# Patient Record
Sex: Female | Born: 1945 | Race: Black or African American | Hispanic: No | Marital: Married | Smoking: Former smoker
Health system: Southern US, Community
[De-identification: ages and names within clinical notes are randomized; demographics above are authoritative.]

## PROBLEM LIST (undated history)

## (undated) DIAGNOSIS — N189 Chronic kidney disease, unspecified: Secondary | ICD-10-CM

## (undated) DIAGNOSIS — Z972 Presence of dental prosthetic device (complete) (partial): Secondary | ICD-10-CM

## (undated) DIAGNOSIS — F0390 Unspecified dementia without behavioral disturbance: Secondary | ICD-10-CM

## (undated) DIAGNOSIS — I10 Essential (primary) hypertension: Secondary | ICD-10-CM

## (undated) HISTORY — PX: ABDOMINAL HYSTERECTOMY: SHX81

---

## 2010-09-21 ENCOUNTER — Ambulatory Visit: Payer: Self-pay | Admitting: Emergency Medicine

## 2010-09-23 LAB — PATHOLOGY REPORT

## 2010-10-15 ENCOUNTER — Ambulatory Visit: Payer: Self-pay | Admitting: Unknown Physician Specialty

## 2013-04-12 ENCOUNTER — Ambulatory Visit: Payer: Self-pay | Admitting: Family Medicine

## 2018-04-23 DIAGNOSIS — I10 Essential (primary) hypertension: Secondary | ICD-10-CM | POA: Diagnosis present

## 2019-06-12 ENCOUNTER — Other Ambulatory Visit: Payer: Self-pay | Admitting: Family Medicine

## 2019-06-12 DIAGNOSIS — Z1231 Encounter for screening mammogram for malignant neoplasm of breast: Secondary | ICD-10-CM

## 2019-07-29 ENCOUNTER — Ambulatory Visit
Admission: RE | Admit: 2019-07-29 | Discharge: 2019-07-29 | Disposition: A | Discharge status: Home / Self Care | Payer: Medicare Other | Source: Ambulatory Visit | Attending: Family Medicine | Admitting: Family Medicine

## 2019-07-29 ENCOUNTER — Encounter: Payer: Self-pay | Admitting: Radiology

## 2019-07-29 DIAGNOSIS — Z1231 Encounter for screening mammogram for malignant neoplasm of breast: Secondary | ICD-10-CM | POA: Diagnosis not present

## 2019-11-27 ENCOUNTER — Ambulatory Visit: Payer: Medicare Other

## 2019-11-30 ENCOUNTER — Ambulatory Visit: Payer: Self-pay

## 2019-12-14 ENCOUNTER — Ambulatory Visit: Payer: Self-pay

## 2019-12-16 ENCOUNTER — Ambulatory Visit: Payer: Medicare PPO | Attending: Internal Medicine

## 2019-12-16 DIAGNOSIS — Z23 Encounter for immunization: Secondary | ICD-10-CM | POA: Insufficient documentation

## 2019-12-16 NOTE — Progress Notes (Signed)
   Covid-19 Vaccination Clinic  Name:  ANGELICIA LESSNER    MRN: [id number] DOB: 01-Aug-1946  12/16/2019  Ms. Sunderlin was observed post Covid-19 immunization for 15 minutes without incidence. She was provided with Vaccine Information Sheet and instruction to access the V-Safe system.   Ms. Kuznia was instructed to call 911 with any severe reactions post vaccine: Marland Kitchen Difficulty breathing  . Swelling of your face and throat  . A fast heartbeat  . A bad rash all over your body  . Dizziness and weakness    Immunizations Administered    Name Date Dose VIS Date Route   Pfizer COVID-19 Vaccine 12/16/2019 11:46 AM 0.3 mL 10/18/2019 Intramuscular   Manufacturer: [employer], [employer]   Lot: [id number]   NDC: [address]-9

## 2019-12-30 ENCOUNTER — Ambulatory Visit: Payer: Self-pay

## 2020-01-08 ENCOUNTER — Ambulatory Visit: Payer: Medicare PPO

## 2020-01-08 ENCOUNTER — Ambulatory Visit: Payer: Medicare PPO | Attending: Internal Medicine

## 2020-01-08 DIAGNOSIS — Z23 Encounter for immunization: Secondary | ICD-10-CM | POA: Insufficient documentation

## 2020-01-08 NOTE — Progress Notes (Signed)
   Covid-19 Vaccination Clinic  Name:  Yolanda Moore    MRN: [id number] DOB: 1946-01-24  01/08/2020  Yolanda Moore was observed post Covid-19 immunization for 15 minutes without incident. She was provided with Vaccine Information Sheet and instruction to access the V-Safe system.   Yolanda Moore was instructed to call 911 with any severe reactions post vaccine: Marland Kitchen Difficulty breathing  . Swelling of face and throat  . A fast heartbeat  . A bad rash all over body  . Dizziness and weakness   Immunizations Administered    Name Date Dose VIS Date Route   Pfizer COVID-19 Vaccine 01/08/2020  8:20 AM 0.3 mL 10/18/2019 Intramuscular   Manufacturer: [employer], [employer]   Lot: [id number]   NDC: [address]-6    2

## 2020-01-10 ENCOUNTER — Ambulatory Visit: Payer: Medicare PPO

## 2020-04-19 ENCOUNTER — Emergency Department
Admission: EM | Admit: 2020-04-19 | Discharge: 2020-04-19 | Disposition: A | Discharge status: Home / Self Care | Payer: Medicare PPO | Attending: Student | Admitting: Student

## 2020-04-19 ENCOUNTER — Encounter: Payer: Self-pay | Admitting: Emergency Medicine

## 2020-04-19 ENCOUNTER — Emergency Department: Payer: Medicare PPO

## 2020-04-19 DIAGNOSIS — W06XXXA Fall from bed, initial encounter: Secondary | ICD-10-CM | POA: Insufficient documentation

## 2020-04-19 DIAGNOSIS — F0390 Unspecified dementia without behavioral disturbance: Secondary | ICD-10-CM | POA: Insufficient documentation

## 2020-04-19 DIAGNOSIS — Y92013 Bedroom of single-family (private) house as the place of occurrence of the external cause: Secondary | ICD-10-CM | POA: Diagnosis not present

## 2020-04-19 DIAGNOSIS — S92001A Unspecified fracture of right calcaneus, initial encounter for closed fracture: Secondary | ICD-10-CM | POA: Diagnosis not present

## 2020-04-19 DIAGNOSIS — S99911A Unspecified injury of right ankle, initial encounter: Secondary | ICD-10-CM | POA: Diagnosis present

## 2020-04-19 DIAGNOSIS — Y9389 Activity, other specified: Secondary | ICD-10-CM | POA: Insufficient documentation

## 2020-04-19 DIAGNOSIS — F17200 Nicotine dependence, unspecified, uncomplicated: Secondary | ICD-10-CM | POA: Insufficient documentation

## 2020-04-19 DIAGNOSIS — S82891A Other fracture of right lower leg, initial encounter for closed fracture: Secondary | ICD-10-CM

## 2020-04-19 DIAGNOSIS — Y998 Other external cause status: Secondary | ICD-10-CM | POA: Diagnosis not present

## 2020-04-19 DIAGNOSIS — M25571 Pain in right ankle and joints of right foot: Secondary | ICD-10-CM

## 2020-04-19 HISTORY — DX: Other fracture of right lower leg, initial encounter for closed fracture: S82.891A

## 2020-04-19 LAB — CBC WITH DIFFERENTIAL/PLATELET
Abs Immature Granulocytes: 0.06 10*3/uL (ref 0.00–0.07)
Basophils Absolute: 0 10*3/uL (ref 0.0–0.1)
Basophils Relative: 0 %
Eosinophils Absolute: 0.1 10*3/uL (ref 0.0–0.5)
Eosinophils Relative: 1 %
HCT: 36.8 % (ref 36.0–46.0)
Hemoglobin: 12 g/dL (ref 12.0–15.0)
Immature Granulocytes: 1 %
Lymphocytes Relative: 28 %
Lymphs Abs: 2.3 10*3/uL (ref 0.7–4.0)
MCH: 28.6 pg (ref 26.0–34.0)
MCHC: 32.6 g/dL (ref 30.0–36.0)
MCV: 87.8 fL (ref 80.0–100.0)
Monocytes Absolute: 1.1 10*3/uL — ABNORMAL HIGH (ref 0.1–1.0)
Monocytes Relative: 13 %
Neutro Abs: 4.8 10*3/uL (ref 1.7–7.7)
Neutrophils Relative %: 57 %
Platelets: 380 10*3/uL (ref 150–400)
RBC: 4.19 MIL/uL (ref 3.87–5.11)
RDW: 14.5 % (ref 11.5–15.5)
WBC: 8.3 10*3/uL (ref 4.0–10.5)
nRBC: 0 % (ref 0.0–0.2)

## 2020-04-19 LAB — BASIC METABOLIC PANEL
Anion gap: 12 (ref 5–15)
BUN: 17 mg/dL (ref 8–23)
CO2: 23 mmol/L (ref 22–32)
Calcium: 9.7 mg/dL (ref 8.9–10.3)
Chloride: 103 mmol/L (ref 98–111)
Creatinine, Ser: 1.3 mg/dL — ABNORMAL HIGH (ref 0.44–1.00)
GFR calc Af Amer: 47 mL/min — ABNORMAL LOW (ref >60)
GFR calc non Af Amer: 41 mL/min — ABNORMAL LOW (ref >60)
Glucose, Bld: 100 mg/dL — ABNORMAL HIGH (ref 70–99)
Potassium: 3.7 mmol/L (ref 3.5–5.1)
Sodium: 138 mmol/L (ref 135–145)

## 2020-04-19 MED ORDER — FREE SPIRIT KNEE/LEG WALKER MISC: 1.0000 | 0 refills | Status: AC

## 2020-04-19 NOTE — Discharge Instructions (Addendum)
Thank you for letting us take care of you in the emergency department today.   Your x-rays showed that you have a fracture of your heel bone. We have put you in a splint for this. You should follow-up with the orthopedic doctors in the clinic later this week.  Their information is below.  Do not apply any weight through the heel.  We have written you a prescription for special kind of knee walker.  Please take this to a medical supply store and have it filled.  This is a special kind of walker that will help keep your weight off of the leg.  Please follow up with: Orthopedic doctor, information below.   Please return to the ER for any new or worsening symptoms, including severe swelling, increased pain, numbness or tingling, or any other signs/symptoms that are concerning to you.

## 2020-04-19 NOTE — ED Notes (Signed)
E-signature not working at this time. Pt  And family member (sister) verbalized understanding of D/C instructions, prescriptions and follow up care with no further questions at this time. Pt in NAD and wheeled to car at time of D/C.

## 2020-04-19 NOTE — ED Triage Notes (Signed)
Pt to ED with c/o of right leg swelling and pain. Pt states has been ongoing for approx 2 weeks. Swelling as well as redness noted. Pt states difficulty ambulating.

## 2020-04-19 NOTE — ED Notes (Signed)
This tech and Mardelle Matte, EDT placed posterior ankle w/ stirrup and walker provided to pt.

## 2020-04-19 NOTE — ED Provider Notes (Addendum)
[facility] Emergency Department Provider Note  ____________________________________________   First MD Initiated Contact with Patient 04/19/20 1658     (approximate)  I have reviewed the triage vital signs and the nursing notes.  History  Chief Complaint Leg Swelling    HPI Kynsli PAYDEN BONUS is a 74 y.o. female with history of dementia who presents to the emergency department for right ankle pain and swelling.  Patient was attempting to get down from the top of a bunk when she evidently fell.  This was not witnessed, family was in the next room but came immediately and when they heard her fall.  They think she landed on her feet and/or twisted her ankle.  Patient has a history of dementia and is unable to provide any more meaningful details.  She does report pain to the right ankle, aching, moderate in severity.  No radiation.  Worsened with palpation and movement, relieved with rest.  She denies any other pain elsewhere.  Denies any head pain, neck pain, back pain, chest pain, abdominal pain, or other extremity pain.  She is not on any blood thinning medications.  Caveat: History limited due to patient's dementia, supplemented by daughter at bedside, though she was not witnessed to the fall.   Past Medical Hx History reviewed. No pertinent past medical history.  Problem List There are no problems to display for this patient.   Past Surgical Hx History reviewed. No pertinent surgical history.  Medications Prior to Admission medications   Not on File    Allergies Patient has no known allergies.  Family Hx No family history on file.  Social Hx Social History   Tobacco Use  . Smoking status: Current Some Day Smoker  . Smokeless tobacco: Never Used  Substance Use Topics  . Alcohol use: Not Currently  . Drug use: Never     Review of Systems  Constitutional: Negative for fever. Negative for chills. Eyes: Negative for visual changes. ENT:  Negative for sore throat. Cardiovascular: Negative for chest pain. Respiratory: Negative for shortness of breath. Gastrointestinal: Negative for nausea. Negative for vomiting.  Genitourinary: Negative for dysuria. Musculoskeletal: + R ankle pain and swelling Skin: Negative for rash. Neurological: Negative for headaches.   Physical Exam  Vital Signs: ED Triage Vitals  Enc Vitals Group     BP 04/19/20 1613 (!) 142/54     Pulse Rate 04/19/20 1613 65     Resp 04/19/20 1613 16     Temp 04/19/20 1613 98.5 F (36.9 C)     Temp Source 04/19/20 1613 Oral     SpO2 04/19/20 1613 100 %     Weight 04/19/20 1612 173 lb (78.5 kg)     Height 04/19/20 1612 5' (1.524 m)     Head Circumference --      Peak Flow --      Pain Score 04/19/20 1626 0     Pain Loc --      Pain Edu? --      Excl. in GC? --     Constitutional: Awake and alert.  Pleasantly demented.  Daughter at bedside. Head: Normocephalic. Atraumatic. Eyes: Conjunctivae clear. Sclera anicteric. Pupils equal and symmetric. Nose: No masses or lesions. No congestion or rhinorrhea. Mouth/Throat: Wearing mask.  Neck: No stridor. Trachea midline.  No midline CS tenderness. Cardiovascular: Normal rate, regular rhythm. Extremities well perfused. Chest: Chest wall stable, nontender.  No palpable crepitance, deformities, or step-offs. Respiratory: Normal respiratory effort.  Lungs CTAB. Gastrointestinal: Soft.  Non-distended. Non-tender.  Pelvis: Stable with AP and lateral compression.  FROM bilateral hips. Genitourinary: Deferred. Musculoskeletal: RLE - significant swelling to the distal leg, about the ankle, and dorsum of the right foot.  Generalized tenderness about the ankle and heel.  Able to wiggle toes.  Sensation intact distally.  Toes are warm and well-perfused.  Significant swelling/edema, but compartments remain soft and compressible.  Otherwise, FROM to bilateral shoulders, elbows, wrists, R hip/knee, and L hip/knee/ankle. NT thru  palpation of remainder of BUE and BLE aside from above. Back: No midline C/T/L-spine tenderness.  No step-offs or deformities. Neurologic:  Normal speech and language.  Consistent with history of dementia.  No gross focal or lateralizing neurologic deficits are appreciated.  Skin: Ecchymosis of the right foot, ankle, proximal leg.  No lacerations. Psychiatric: Mood and affect are appropriate for situation.   Radiology  Personally reviewed available imaging myself.   XR ankle R - IMPRESSION:  1. Acute mildly displaced calcaneal fracture.  2. Acute nondisplaced fracture of the lateral malleolus.   XR foot R - IMPRESSION:  1. Acute mildly displaced calcaneal fracture.  2. Acute nondisplaced fracture of the lateral malleolus.   XR lumbar - IMPRESSION:  No acute displaced fracture or significant malalignment. Mild  multilevel degenerative changes are noted throughout the visualized  portions of the thoracolumbar spine.    Procedures  Procedure(s) performed (including critical care):  .Splint Application  Date/Time: 04/20/2020 1:01 AM Performed by: Miguel Aschoff., MD Authorized by: Miguel Aschoff., MD   Consent:    Consent obtained:  Verbal   Consent given by:  Patient (daughter at bedside)   Risks discussed:  Discoloration, numbness, pain and swelling Pre-procedure details:    Sensation:  Normal Procedure details:    Laterality:  Right   Location:  Leg   Leg:  R lower leg   Cast type: posterior w/ stirrup.   Supplies:  Cotton padding, Ortho-Glass and elastic bandage Post-procedure details:    Pain:  Unchanged   Sensation:  Normal   Patient tolerance of procedure:  Tolerated well, no immediate complications Comments:     Posterior splint with stirrup placed by NT under my supervision.  Extra padding used given nature of injury and likelihood of significant swelling.  Patient tolerated well.  Remains pain-free.     Initial Impression / Assessment and Plan / MDM / ED  Course  74 y.o. female who presents to the ED for right foot, ankle, distal leg swelling and pain after a fall, as above.  Ddx: fracture, dislocation, sprain  Will plan for labs, imaging.   XR imaging reveals calcaneal fracture as well as a lateral malleolus fracture.  Given injury, added on XR lumbar spine which was negative.  Discussed case with orthopedics, injury is nonoperative.  Will place in posterior splint with stirrup with extra padding given tendency for excessive swelling.  As noted above, compartments remain soft and compressible, toes are warm and well-perfused, do not suspect compartment syndrome at this time.  Nonweightbearing, and follow-up in the office.  Rx for knee wheeler DME provided as well.  Updated patient and family at bedside on results and plan of care.  They voiced understanding and are in agreement.  Discussed return precautions.  _______________________________   As part of my medical decision making I have reviewed available labs, radiology tests, reviewed old records/performed chart review, obtained additional history from daughter at bedside, and discussed with consultants (orthopedics).     Final Clinical Impression(s) /  ED Diagnosis  Final diagnoses:  Acute right ankle pain  Closed displaced fracture of right calcaneus, unspecified portion of calcaneus, initial encounter       Note:  This document was prepared using Dragon voice recognition software and may include unintentional dictation errors.     Lilia Pro., MD 04/20/20 [phone]

## 2020-04-19 NOTE — ED Notes (Signed)
Lab called to draw blood work as pt was stuck 3 times prior to coming to treatment room

## 2020-09-07 ENCOUNTER — Other Ambulatory Visit: Payer: Self-pay

## 2020-09-07 ENCOUNTER — Emergency Department: Payer: Medicare PPO

## 2020-09-07 ENCOUNTER — Observation Stay
Admission: EM | Admit: 2020-09-07 | Discharge: 2020-09-10 | Disposition: A | Discharge status: Home / Self Care | Payer: Medicare PPO | Attending: Internal Medicine | Admitting: Internal Medicine

## 2020-09-07 DIAGNOSIS — N1832 Chronic kidney disease, stage 3b: Secondary | ICD-10-CM

## 2020-09-07 DIAGNOSIS — F0280 Dementia in other diseases classified elsewhere without behavioral disturbance: Secondary | ICD-10-CM

## 2020-09-07 DIAGNOSIS — Z20822 Contact with and (suspected) exposure to covid-19: Secondary | ICD-10-CM | POA: Insufficient documentation

## 2020-09-07 DIAGNOSIS — I219 Acute myocardial infarction, unspecified: Secondary | ICD-10-CM

## 2020-09-07 DIAGNOSIS — R531 Weakness: Secondary | ICD-10-CM | POA: Diagnosis present

## 2020-09-07 DIAGNOSIS — N179 Acute kidney failure, unspecified: Secondary | ICD-10-CM

## 2020-09-07 DIAGNOSIS — M2681 Anterior soft tissue impingement: Secondary | ICD-10-CM | POA: Insufficient documentation

## 2020-09-07 DIAGNOSIS — R262 Difficulty in walking, not elsewhere classified: Secondary | ICD-10-CM | POA: Diagnosis not present

## 2020-09-07 DIAGNOSIS — M6281 Muscle weakness (generalized): Secondary | ICD-10-CM | POA: Diagnosis not present

## 2020-09-07 DIAGNOSIS — R7989 Other specified abnormal findings of blood chemistry: Secondary | ICD-10-CM

## 2020-09-07 DIAGNOSIS — I129 Hypertensive chronic kidney disease with stage 1 through stage 4 chronic kidney disease, or unspecified chronic kidney disease: Secondary | ICD-10-CM | POA: Insufficient documentation

## 2020-09-07 DIAGNOSIS — I214 Non-ST elevation (NSTEMI) myocardial infarction: Secondary | ICD-10-CM | POA: Diagnosis not present

## 2020-09-07 DIAGNOSIS — Z79899 Other long term (current) drug therapy: Secondary | ICD-10-CM | POA: Diagnosis not present

## 2020-09-07 DIAGNOSIS — G309 Alzheimer's disease, unspecified: Secondary | ICD-10-CM | POA: Diagnosis not present

## 2020-09-07 DIAGNOSIS — F0390 Unspecified dementia without behavioral disturbance: Secondary | ICD-10-CM

## 2020-09-07 DIAGNOSIS — I10 Essential (primary) hypertension: Secondary | ICD-10-CM | POA: Diagnosis present

## 2020-09-07 DIAGNOSIS — F17210 Nicotine dependence, cigarettes, uncomplicated: Secondary | ICD-10-CM | POA: Diagnosis not present

## 2020-09-07 DIAGNOSIS — R748 Abnormal levels of other serum enzymes: Secondary | ICD-10-CM | POA: Insufficient documentation

## 2020-09-07 DIAGNOSIS — R9431 Abnormal electrocardiogram [ECG] [EKG]: Secondary | ICD-10-CM | POA: Diagnosis not present

## 2020-09-07 DIAGNOSIS — R778 Other specified abnormalities of plasma proteins: Secondary | ICD-10-CM

## 2020-09-07 DIAGNOSIS — N189 Chronic kidney disease, unspecified: Secondary | ICD-10-CM

## 2020-09-07 HISTORY — DX: Chronic kidney disease, unspecified: N18.9

## 2020-09-07 HISTORY — DX: Essential (primary) hypertension: I10

## 2020-09-07 HISTORY — DX: Unspecified dementia, unspecified severity, without behavioral disturbance, psychotic disturbance, mood disturbance, and anxiety: F03.90

## 2020-09-07 HISTORY — DX: Acute myocardial infarction, unspecified: I21.9

## 2020-09-07 LAB — COMPREHENSIVE METABOLIC PANEL
ALT: 16 U/L (ref 0–44)
AST: 30 U/L (ref 15–41)
Albumin: 4.8 g/dL (ref 3.5–5.0)
Alkaline Phosphatase: 59 U/L (ref 38–126)
Anion gap: 14 (ref 5–15)
BUN: 22 mg/dL (ref 8–23)
CO2: 22 mmol/L (ref 22–32)
Calcium: 10.3 mg/dL (ref 8.9–10.3)
Chloride: 102 mmol/L (ref 98–111)
Creatinine, Ser: 1.76 mg/dL — ABNORMAL HIGH (ref 0.44–1.00)
GFR, Estimated: 30 mL/min — ABNORMAL LOW (ref >60)
Glucose, Bld: 88 mg/dL (ref 70–99)
Potassium: 4.2 mmol/L (ref 3.5–5.1)
Sodium: 138 mmol/L (ref 135–145)
Total Bilirubin: 0.6 mg/dL (ref 0.3–1.2)
Total Protein: 8.7 g/dL — ABNORMAL HIGH (ref 6.5–8.1)

## 2020-09-07 LAB — CBC
HCT: 41.4 % (ref 36.0–46.0)
Hemoglobin: 13.5 g/dL (ref 12.0–15.0)
MCH: 28.2 pg (ref 26.0–34.0)
MCHC: 32.6 g/dL (ref 30.0–36.0)
MCV: 86.6 fL (ref 80.0–100.0)
Platelets: 401 10*3/uL — ABNORMAL HIGH (ref 150–400)
RBC: 4.78 MIL/uL (ref 3.87–5.11)
RDW: 15.3 % (ref 11.5–15.5)
WBC: 11.7 10*3/uL — ABNORMAL HIGH (ref 4.0–10.5)
nRBC: 0 % (ref 0.0–0.2)

## 2020-09-07 LAB — TROPONIN I (HIGH SENSITIVITY): Troponin I (High Sensitivity): 163 ng/L (ref <18)

## 2020-09-07 MED ORDER — HEPARIN (PORCINE) 25000 UT/250ML-% IV SOLN
800.0000 [IU]/h | INTRAVENOUS | Status: DC
  Administered 2020-09-08: 800 [IU]/h via INTRAVENOUS
  Administered 2020-09-08: 900 [IU]/h via INTRAVENOUS
  Filled 2020-09-07: qty 250

## 2020-09-07 MED ORDER — ASPIRIN 81 MG PO CHEW
324.0000 mg | CHEWABLE_TABLET | Freq: Once | ORAL | Status: AC
  Administered 2020-09-08: 324 mg via ORAL
  Filled 2020-09-07: qty 4

## 2020-09-07 MED ORDER — HEPARIN (PORCINE) 25000 UT/250ML-% IV SOLN
10.0000 [IU]/kg/h | INTRAVENOUS | Status: DC
Start: 2020-09-07 — End: 2020-09-07

## 2020-09-07 MED ORDER — HEPARIN SODIUM (PORCINE) 5000 UNIT/ML IJ SOLN
4000.0000 [IU] | Freq: Once | INTRAMUSCULAR | Status: AC
  Administered 2020-09-08: 4000 [IU] via INTRAVENOUS
  Filled 2020-09-07: qty 1

## 2020-09-07 NOTE — ED Triage Notes (Addendum)
Pt in with co weakness, pt normally walks independently and today she had acute onset of inability to walk. Pt has dementia and is a poor historian. Pt did not co pain, no facial weakness noted, no slurred speech. Pt was diaphoretic during episode, pt in no distress at this time.  Family states symptoms started at 1500 pt still unable to walk and is weak upon standing.

## 2020-09-07 NOTE — ED Notes (Signed)
Labs obtained with PIV placement.  Covid nasal swab also obtained.

## 2020-09-07 NOTE — ED Provider Notes (Addendum)
[facility]emorrh[facility]  [facility] Emergency Department Provider Note   ____________________________________________   First MD Initiated Contact with Patient 09/07/20 2150     (approximate)  I have reviewed the triage vital signs and the nursing notes.   HISTORY  Chief Complaint Weakness    HPI Yolanda Moore is a 74 y.o. female reports feeling weak today.  She was too weak to walk but did not have any focal weakness as I understand.  She had some sweating with that but did not complain of chest pain or shortness of breath.  There was no slurry speech or facial drooping noted.  Patient also has dementia and is a poor historian.         No past medical history on file. Past history significant for fracture of the right foot in June History of hypertension Alzheimer's disease CKD stage III There are no problems to display for this patient.   No past surgical history on file.  Prior to Admission medications   Medication Sig Start Date End Date Taking? Authorizing Provider  donepezil (ARICEPT) 10 MG tablet Take 10 mg by mouth at bedtime. 04/10/20   [provider]  losartan (COZAAR) 100 MG tablet Take 100 mg by mouth daily. 02/14/20   [provider]    Allergies Patient has no known allergies.  No family history on file.  Social History Social History   Tobacco Use  . Smoking status: Current Some Day Smoker  . Smokeless tobacco: Never Used  Substance Use Topics  . Alcohol use: Not Currently  . Drug use: Never    Review of Systems  Constitutional: No fever/chills Eyes: No visual changes. ENT: No sore throat. Cardiovascular: Denies chest pain. Respiratory: Denies shortness of breath. Gastrointestinal: No abdominal pain.  No nausea, no vomiting.  No diarrhea.  No constipation. Genitourinary: Negative for dysuria. Musculoskeletal: Negative for back pain. Skin: Negative for rash. Neurological: Negative for headaches, focal weakness     ____________________________________________   PHYSICAL EXAM:  VITAL SIGNS: ED Triage Vitals [09/07/20 2004]  Enc Vitals Group     BP (!) 135/35     Pulse Rate 69     Resp 18     Temp 98.3 F (36.8 C)     Temp Source Oral     SpO2 100 %     Weight 165 lb (74.8 kg)     Height      Head Circumference      Peak Flow      Pain Score 0     Pain Loc      Pain Edu?      Excl. in GC?    Constitutional: Alert and oriented. Well appearing and in no acute distress. Eyes: Conjunctivae are normal. PER. EOMI. Head: Atraumatic. Nose: No congestion/rhinnorhea. Mouth/Throat: Mucous membranes are moist.  Oropharynx non-erythematous. Neck: No stridor. Cardiovascular: Normal rate, regular rhythm. Grossly normal heart sounds.  Good peripheral circulation. Respiratory: Normal respiratory effort.  No retractions. Lungs CTAB. Gastrointestinal: Soft and nontender. No distention. No abdominal bruits. . Right ankle edema.  No left-sided edema Neurologic:  Normal speech and language. No gross focal neurologic deficits are appreciated.  Skin:  Skin is warm, dry and intact.   ____________________________________________   LABS (all labs ordered are listed, but only abnormal results are displayed)  Labs Reviewed  CBC - Abnormal; Notable for the following components:      Result Value   WBC 11.7 (*)    Platelets 401 (*)  All other components within normal limits  COMPREHENSIVE METABOLIC PANEL - Abnormal; Notable for the following components:   Creatinine, Ser 1.76 (*)    Total Protein 8.7 (*)    GFR, Estimated 30 (*)    All other components within normal limits  TROPONIN I (HIGH SENSITIVITY) - Abnormal; Notable for the following components:   Troponin I (High Sensitivity) 163 (*)    All other components within normal limits  RESPIRATORY PANEL BY RT PCR (FLU A&B, COVID)  URINALYSIS, COMPLETE (UACMP) WITH MICROSCOPIC  PROTIME-INR  APTT  TROPONIN I (HIGH SENSITIVITY)    ____________________________________________  EKG EKG read interpreted by me shows normal sinus rhythm rate of 72 biphasic T waves in leads 71F and V3 through 6.  ____________________________________________  RADIOLOGY Jill Poling, personally viewed and evaluated these images (plain radiographs) as part of my medical decision making, as well as reviewing the written report by the radiologist.  ED MD interpretation: CT of the head read by radiology shows chronic ischemic changes I reviewed the films Chest x-ray read by me shows no obvious disease. Official radiology report(s): CT Head Wo Contrast  Result Date: 09/07/2020 CLINICAL DATA:  Weakness EXAM: CT HEAD WITHOUT CONTRAST TECHNIQUE: Contiguous axial images were obtained from the base of the skull through the vertex without intravenous contrast. COMPARISON:  None. FINDINGS: Brain: Mild atrophic changes and chronic white matter ischemic changes are seen. No findings to suggest acute hemorrhage, acute infarction or space-occupying mass lesion are noted. Vascular: No hyperdense vessel or unexpected calcification. Skull: Normal. Negative for fracture or focal lesion. Sinuses/Orbits: No acute finding. Other: None. IMPRESSION: Chronic atrophic and ischemic changes without acute abnormality. Electronically Signed   By: Alcide Clever M.D.   On: 09/07/2020 21:04    ____________________________________________   PROCEDURES  Procedure(s) performed (including Critical Care):  Procedures   ____________________________________________   INITIAL IMPRESSION / ASSESSMENT AND PLAN / ED COURSE  With elevated troponin, EKG is abnormal patient does not appear to have any focal weakness suggestive of stroke.  We will get a chest x-ray and start her on aspirin and heparin.  We will plan on getting her in the hospital.  Her GFR is low but not to the point that I would expect her troponin to be 163.               ____________________________________________   FINAL CLINICAL IMPRESSION(S) / ED DIAGNOSES  Final diagnoses:  Elevated troponin  Abnormal EKG     ED Discharge Orders    None      *Please note:  SAMAI COREA was evaluated in Emergency Department on 09/07/2020 for the symptoms described in the history of present illness. She was evaluated in the context of the global COVID-19 pandemic, which necessitated consideration that the patient might be at risk for infection with the SARS-CoV-2 virus that causes COVID-19. Institutional protocols and algorithms that pertain to the evaluation of patients at risk for COVID-19 are in a state of rapid change based on information released by regulatory bodies including the CDC and federal and state organizations. These policies and algorithms were followed during the patient's care in the ED.  Some ED evaluations and interventions may be delayed as a result of limited staffing during and the pandemic.*   Note:  This document was prepared using Dragon voice recognition software and may include unintentional dictation errors.    Arnaldo Natal, MD 09/07/20 2214    Arnaldo Natal, MD 09/07/20 2221 Patient  has no history of recent bleeding GI bleeding junk stroke bumps on the head surgery etc. we will give her aspirin and heparin.   Arnaldo Natal, MD 09/07/20 2222

## 2020-09-07 NOTE — Progress Notes (Signed)
ANTICOAGULATION CONSULT NOTE - Initial Consult  Pharmacy Consult for Heparin Indication: chest pain/ACS  No Known Allergies  Patient Measurements: Weight: 74.8 kg (165 lb) Heparin Dosing Weight: 74.8 kg   Vital Signs: Temp: 98.3 F (36.8 C) (11/01 2004) Temp Source: Oral (11/01 2004) BP: 135/35 (11/01 2004) Pulse Rate: 69 (11/01 2004)  Labs: Recent Labs    09/07/20 2008  HGB 13.5  HCT 41.4  PLT 401*  CREATININE 1.76*  TROPONINIHS 163*    Estimated Creatinine Clearance: 25.3 mL/min (A) (by C-G formula based on SCr of 1.76 mg/dL (H)).   Medical History: No past medical history on file.  Medications:  (Not in a hospital admission)   Assessment: Pharmacy consulted to dose heparin in this 74 year old female admitted with ACS/NSTEMI.  No prior anticoag noted.  CrCl = 25.3 ml/min  Goal of Therapy:  Heparin level 0.3-0.7 units/ml Monitor platelets by anticoagulation protocol: Yes   Plan:  Give 4000 units bolus x 1  Will start heparin drip at 900 units/hr.  Will order HL 8 hrs after start of drip.   Jarold Macomber D 09/07/2020,10:30 PM

## 2020-09-07 NOTE — ED Notes (Signed)
Spoke with Dr. Katrinka Blazing CT ordered, but not a code stroke.

## 2020-09-08 ENCOUNTER — Observation Stay (HOSPITAL_BASED_OUTPATIENT_CLINIC_OR_DEPARTMENT_OTHER)
Admit: 2020-09-08 | Discharge: 2020-09-08 | Disposition: A | Discharge status: Home / Self Care | Payer: Medicare PPO | Attending: Internal Medicine | Admitting: Internal Medicine

## 2020-09-08 ENCOUNTER — Encounter: Payer: Self-pay | Admitting: Internal Medicine

## 2020-09-08 DIAGNOSIS — I10 Essential (primary) hypertension: Secondary | ICD-10-CM | POA: Diagnosis not present

## 2020-09-08 DIAGNOSIS — R778 Other specified abnormalities of plasma proteins: Secondary | ICD-10-CM

## 2020-09-08 DIAGNOSIS — F0390 Unspecified dementia without behavioral disturbance: Secondary | ICD-10-CM

## 2020-09-08 DIAGNOSIS — R531 Weakness: Secondary | ICD-10-CM | POA: Diagnosis not present

## 2020-09-08 DIAGNOSIS — N179 Acute kidney failure, unspecified: Secondary | ICD-10-CM

## 2020-09-08 DIAGNOSIS — I340 Nonrheumatic mitral (valve) insufficiency: Secondary | ICD-10-CM

## 2020-09-08 DIAGNOSIS — I214 Non-ST elevation (NSTEMI) myocardial infarction: Secondary | ICD-10-CM | POA: Diagnosis not present

## 2020-09-08 DIAGNOSIS — N189 Chronic kidney disease, unspecified: Secondary | ICD-10-CM

## 2020-09-08 LAB — BASIC METABOLIC PANEL
Anion gap: 8 (ref 5–15)
BUN: 18 mg/dL (ref 8–23)
CO2: 26 mmol/L (ref 22–32)
Calcium: 9.4 mg/dL (ref 8.9–10.3)
Chloride: 104 mmol/L (ref 98–111)
Creatinine, Ser: 1.4 mg/dL — ABNORMAL HIGH (ref 0.44–1.00)
GFR, Estimated: 39 mL/min — ABNORMAL LOW (ref >60)
Glucose, Bld: 106 mg/dL — ABNORMAL HIGH (ref 70–99)
Potassium: 4.5 mmol/L (ref 3.5–5.1)
Sodium: 138 mmol/L (ref 135–145)

## 2020-09-08 LAB — ECHOCARDIOGRAM COMPLETE
AR max vel: 2.42 cm2
AV Area VTI: 2.76 cm2
AV Area mean vel: 2.4 cm2
AV Mean grad: 2 mmHg
AV Peak grad: 4.5 mmHg
Ao pk vel: 1.06 m/s
Area-P 1/2: 3.42 cm2
Height: 60 in
S' Lateral: 2.87 cm
Weight: 2638.47 oz

## 2020-09-08 LAB — PROTIME-INR
INR: 1 (ref 0.8–1.2)
Prothrombin Time: 13.1 seconds (ref 11.4–15.2)

## 2020-09-08 LAB — URINALYSIS, COMPLETE (UACMP) WITH MICROSCOPIC
Bilirubin Urine: NEGATIVE
Glucose, UA: NEGATIVE mg/dL
Hgb urine dipstick: NEGATIVE
Ketones, ur: NEGATIVE mg/dL
Nitrite: NEGATIVE
Protein, ur: NEGATIVE mg/dL
Specific Gravity, Urine: 1.021 (ref 1.005–1.030)
pH: 5 (ref 5.0–8.0)

## 2020-09-08 LAB — RESPIRATORY PANEL BY RT PCR (FLU A&B, COVID)
Influenza A by PCR: NEGATIVE
Influenza B by PCR: NEGATIVE
SARS Coronavirus 2 by RT PCR: NEGATIVE

## 2020-09-08 LAB — TROPONIN I (HIGH SENSITIVITY)
Troponin I (High Sensitivity): 252 ng/L (ref <18)
Troponin I (High Sensitivity): 225 ng/L (ref <18)

## 2020-09-08 LAB — APTT: aPTT: 27 seconds (ref 24–36)

## 2020-09-08 LAB — HEPARIN LEVEL (UNFRACTIONATED): Heparin Unfractionated: 0.82 IU/mL — ABNORMAL HIGH (ref 0.30–0.70)

## 2020-09-08 MED ORDER — ONDANSETRON HCL 4 MG/2ML IJ SOLN: 4.0000 mg | Freq: Four times a day (QID) | INTRAMUSCULAR | Status: DC | PRN

## 2020-09-08 MED ORDER — ASPIRIN EC 81 MG PO TBEC
81.0000 mg | DELAYED_RELEASE_TABLET | Freq: Every day | ORAL | Status: DC
  Administered 2020-09-09 – 2020-09-10 (×2): 81 mg via ORAL
  Filled 2020-09-08 (×2): qty 1

## 2020-09-08 MED ORDER — DONEPEZIL HCL 5 MG PO TABS
10.0000 mg | ORAL_TABLET | Freq: Every day | ORAL | Status: DC
  Administered 2020-09-08 – 2020-09-09 (×2): 10 mg via ORAL
  Filled 2020-09-08 (×3): qty 2

## 2020-09-08 MED ORDER — PERFLUTREN LIPID MICROSPHERE
1.0000 mL | INTRAVENOUS | Status: AC | PRN
  Administered 2020-09-08: 2 mL via INTRAVENOUS
  Filled 2020-09-08: qty 10

## 2020-09-08 MED ORDER — NITROGLYCERIN 0.4 MG SL SUBL: 0.4000 mg | SUBLINGUAL_TABLET | SUBLINGUAL | Status: DC | PRN

## 2020-09-08 MED ORDER — ACETAMINOPHEN 325 MG PO TABS
650.0000 mg | ORAL_TABLET | ORAL | Status: DC | PRN
  Administered 2020-09-08 – 2020-09-09 (×2): 650 mg via ORAL
  Filled 2020-09-08 (×2): qty 2

## 2020-09-08 MED ORDER — ATORVASTATIN CALCIUM 20 MG PO TABS
40.0000 mg | ORAL_TABLET | Freq: Every day | ORAL | Status: DC
  Administered 2020-09-08 – 2020-09-10 (×3): 40 mg via ORAL
  Filled 2020-09-08 (×3): qty 2

## 2020-09-08 MED ORDER — SODIUM CHLORIDE 0.9 % IV SOLN
INTRAVENOUS | Status: DC
  Administered 2020-09-08: 1000 mL via INTRAVENOUS

## 2020-09-08 MED ORDER — CARVEDILOL 6.25 MG PO TABS
6.2500 mg | ORAL_TABLET | Freq: Two times a day (BID) | ORAL | Status: DC
  Administered 2020-09-08 – 2020-09-10 (×4): 6.25 mg via ORAL
  Filled 2020-09-08 (×4): qty 1

## 2020-09-08 NOTE — Progress Notes (Signed)
ANTICOAGULATION CONSULT NOTE   Pharmacy Consult for Heparin Indication: chest pain/ACS  No Known Allergies  Patient Measurements: Height: 5' (152.4 cm) Weight: 74.8 kg (164 lb 14.5 oz) IBW/kg (Calculated) : 45.5 Heparin Dosing Weight: 74.8 kg   Vital Signs: Temp: 98.7 F (37.1 C) (11/02 0907) Temp Source: Oral (11/02 0907) BP: 136/67 (11/02 0907) Pulse Rate: 65 (11/02 0907)  Labs: Recent Labs    09/07/20 2008 09/07/20 2208 09/07/20 2349 09/08/20 1001  HGB 13.5  --   --   --   HCT 41.4  --   --   --   PLT 401*  --   --   --   APTT  --   --  27  --   LABPROT  --   --  13.1  --   INR  --   --  1.0  --   HEPARINUNFRC  --   --   --  0.82*  CREATININE 1.76*  --   --   --   TROPONINIHS 163* 252*  --   --     Estimated Creatinine Clearance: 25.3 mL/min (A) (by C-G formula based on SCr of 1.76 mg/dL (H)).   Medical History: Past Medical History:  Diagnosis Date  . Dementia (HCC)   . HTN (hypertension)     Medications:  Medications Prior to Admission  Medication Sig Dispense Refill Last Dose  . donepezil (ARICEPT) 10 MG tablet Take 10 mg by mouth at bedtime.   09/06/2020 at Unknown time  . hydrochlorothiazide (HYDRODIURIL) 25 MG tablet Take 25 mg by mouth daily.   09/07/2020 at Unknown time  . losartan (COZAAR) 100 MG tablet Take 100 mg by mouth daily.   09/07/2020 at Unknown time    Assessment: Pharmacy consulted to dose heparin in this 74 year old female admitted with ACS/NSTEMI.  No prior anticoag noted.   11/2 1001 HL 0.82.   Goal of Therapy:  Heparin level 0.3-0.7 units/ml Monitor platelets by anticoagulation protocol: Yes   Plan:  Heparin level is slightly supratherapeutic. Will decrease heparin infusion to 800 units/hr. Recheck heparin level in 8 hours. CBC daily while on heparin.    Ronnald Ramp, PharmD, BCPS 09/08/2020,10:36 AM

## 2020-09-08 NOTE — ED Notes (Signed)
Patient resting in bed, easily awakes to verbal stimuli.  Patient is pleasant and cooperative, daughter will return this am.

## 2020-09-08 NOTE — Progress Notes (Signed)
   09/08/20 1337  Vitals  Temp 98.5 F (36.9 C)  Temp Source Oral  BP (!) 128/59  MAP (mmHg) 80  BP Location Right Arm  BP Method Automatic  Patient Position (if appropriate) Lying  Pulse Rate (!) 58  Pulse Rate Source Dinamap  Resp 16  MEWS COLOR  MEWS Score Color Green  Oxygen Therapy  SpO2 100 %  MEWS Score  MEWS Temp 0  MEWS Systolic 0  MEWS Pulse 0  MEWS RR 0  MEWS LOC 0  MEWS Score 0  Tech witnessed patient kneeled on the floor upon entering room around 1330. No injuries obtained. VSS. Dr. Renae Gloss as well as daughter [facility]) updated. Patient moved closer to nurses' station. Tele sitter ordered for observation.

## 2020-09-08 NOTE — Consult Note (Signed)
Cardiology Consultation:   Patient ID: Yolanda Moore MRN: [id number]; DOB: 22-Oct-1946  Admit date: 09/07/2020 Date of Consult: 09/08/2020  Primary Care Provider: Marisue Ivan, MD [facility] Cardiologist: New - Lonney Revak [facility] Electrophysiologist:  None    Patient Profile:   Yolanda Moore is a 74 y.o. female with a hx of hypertension, chronic kidney disease stage III, and dementia who is being seen today for the evaluation of weakness and elevated troponin at the request of Dr. Para March.  History of Present Illness:   Yolanda Moore presented to the emergency department yesterday due to weakness.  Much of the history is obtained from the patient's daughter, as who is at the bedside.  Reportedly, Yolanda Moore was in her usual state of health until she had generalized weakness yesterday.  There was no clear precipitant.  She denies having had any chest pain, shortness of breath, palpitations, lightheadedness, or edema.  In the emergency department, she was found to have an elevated troponin, most recently 252 yesterday evening (still rising).  Admission EKG showed sinus rhythm with biphasic T waves in the anterolateral leads.  Ms. Templer denies a history of prior cardiac disease and testing.   Past Medical History:  Diagnosis Date  . Chronic kidney disease   . Dementia (HCC)   . HTN (hypertension)     Past Surgical History:  Procedure Laterality Date  . ABDOMINAL HYSTERECTOMY       Home Medications:  Prior to Admission medications   Medication Sig Start Date Zidane Renner Date Taking? Authorizing Provider  donepezil (ARICEPT) 10 MG tablet Take 10 mg by mouth at bedtime. 04/10/20  Yes [provider]  hydrochlorothiazide (HYDRODIURIL) 25 MG tablet Take 25 mg by mouth daily. 08/21/20  Yes [provider]  losartan (COZAAR) 100 MG tablet Take 100 mg by mouth daily. 02/14/20  Yes [provider]    Inpatient Medications: Scheduled Meds: . [START ON 09/09/2020] aspirin EC   81 mg Oral Daily  . atorvastatin  40 mg Oral Daily  . carvedilol  6.25 mg Oral BID WC  . donepezil  10 mg Oral QHS   Continuous Infusions: . sodium chloride 1,000 mL (09/08/20 0155)  . heparin 800 Units/hr (09/08/20 1157)   PRN Meds: acetaminophen, nitroGLYCERIN, ondansetron (ZOFRAN) IV  Allergies:   No Known Allergies  Social History:   Social History   Tobacco Use  . Smoking status: Current Some Day Smoker  . Smokeless tobacco: Never Used  Substance Use Topics  . Alcohol use: Not Currently  . Drug use: Never     Family History:   Family History  Problem Relation Age of Onset  . Cancer Mother   . Cancer Father   . Ovarian cancer Daughter      ROS:  Please see the history of present illness. All other ROS reviewed and negative.     Physical Exam/Data:   Vitals:   09/08/20 0538 09/08/20 0632 09/08/20 0716 09/08/20 0907  BP: (!) 127/51 131/60 130/62 136/67  Pulse: 64 63 67 65  Resp: 16 16 16 16   Temp: (!) 97.1 F (36.2 C) 98.3 F (36.8 C) 98.4 F (36.9 C) 98.7 F (37.1 C)  TempSrc: Oral Oral Oral Oral  SpO2: 100% 100% 99% 100%  Weight:  74.8 kg    Height:  5' (1.524 m)     No intake or output data in the 24 hours ending 09/08/20 1257 Last 3 Weights 09/08/2020 09/08/2020 09/07/2020  Weight (lbs) 164 lb 14.5  oz 164 lb 14.5 oz 164 lb 14.5 oz  Weight (kg) 74.8 kg 74.8 kg 74.8 kg     Body mass index is 32.21 kg/m.  General:  Well nourished, well developed, in no acute distress.  Her daughter is at the bedside. HEENT: normal Lymph: no adenopathy Neck: no JVD Endocrine:  No thryomegaly Vascular: No carotid bruits; FA pulses 2+ bilaterally without bruits  Cardiac:  normal S1, S2; regular rate and rhythm without murmurs, rubs, or gallops. Lungs: Mildly diminished breath sounds throughout without wheezes or crackles. Abd: soft, nontender, no hepatomegaly  Ext: no edema Musculoskeletal:  No deformities, BUE and BLE strength normal and equal Skin: warm and dry    Neuro:  CNs 2-12 intact, no focal abnormalities noted Psych:  Normal affect   EKG:  The EKG was personally reviewed and demonstrates: Normal sinus rhythm with left atrial enlargement, LVH, and biphasic T waves in the anterolateral and inferior leads.  Relevant CV Studies: None.  Laboratory Data:  High Sensitivity Troponin:   Recent Labs  Lab 09/07/20 2008 09/07/20 2208  TROPONINIHS 163* 252*     Chemistry Recent Labs  Lab 09/07/20 2008 09/08/20 1119  NA 138 138  K 4.2 4.5  CL 102 104  CO2 22 26  GLUCOSE 88 106*  BUN 22 18  CREATININE 1.76* 1.40*  CALCIUM 10.3 9.4  GFRNONAA 30* 39*  ANIONGAP 14 8    Recent Labs  Lab 09/07/20 2008  PROT 8.7*  ALBUMIN 4.8  AST 30  ALT 16  ALKPHOS 59  BILITOT 0.6   Hematology Recent Labs  Lab 09/07/20 2008  WBC 11.7*  RBC 4.78  HGB 13.5  HCT 41.4  MCV 86.6  MCH 28.2  MCHC 32.6  RDW 15.3  PLT 401*   BNPNo results for input(s): BNP, PROBNP in the last 168 hours.  DDimer No results for input(s): DDIMER in the last 168 hours.   Radiology/Studies:  CT Head Wo Contrast  Result Date: 09/07/2020 CLINICAL DATA:  Weakness EXAM: CT HEAD WITHOUT CONTRAST TECHNIQUE: Contiguous axial images were obtained from the base of the skull through the vertex without intravenous contrast. COMPARISON:  None. FINDINGS: Brain: Mild atrophic changes and chronic white matter ischemic changes are seen. No findings to suggest acute hemorrhage, acute infarction or space-occupying mass lesion are noted. Vascular: No hyperdense vessel or unexpected calcification. Skull: Normal. Negative for fracture or focal lesion. Sinuses/Orbits: No acute finding. Other: None. IMPRESSION: Chronic atrophic and ischemic changes without acute abnormality. Electronically Signed   By: Alcide Clever M.D.   On: 09/07/2020 21:04   DG Chest Portable 1 View  Result Date: 09/07/2020 CLINICAL DATA:  Weakness, inability to ambulate, dimension EXAM: PORTABLE CHEST 1 VIEW  COMPARISON:  None. FINDINGS: The heart size and mediastinal contours are within normal limits. Both lungs are clear. The visualized skeletal structures are unremarkable. IMPRESSION: No active disease. Electronically Signed   By: Sharlet Salina M.D.   On: 09/07/2020 22:23     Assessment and Plan:   Weakness and elevated troponin: Elevated troponin is nonspecific.  It could be a manifestation of myocardial ischemia, though supply-demand mismatch in the setting of some other systemic process could certainly explain the modest elevation in troponin.  Ms. Voit denies any ischemic symptoms such as chest pain or shortness of breath, though her dementia certainly complicates this.  EKG shows biphasic T waves in the anterolateral and inferior leads of uncertain chronicity.  Continue IV heparin; would favor discontinuation after 48 hours.  Obtain transthoracic echocardiogram.  If LVEF is preserved without obvious wall motion abnormality, Ms. Dory, her daughter, and I would favor continuing with a conservative approach.  It might be useful to obtain a pharmacologic myocardial perfusion stress test during this admission or shortly after discharge to exclude high risk ischemia.  It is reasonable for the patient to eat today, though I will make her n.p.o. after midnight in case stress testing is pursued tomorrow.  Continue carvedilol and atorvastatin.  Acute kidney injury superimposed on chronic kidney disease: Baseline creatinine is 1.1-1.3.  Admission creatinine was elevated at 1.8 but seems to be improving with hydration.  Continue gentle hydration.  Avoid nephrotoxic agents.  In particular, we are reluctant to use IV contrast unless absolutely necessary due to the risk for worsening chronic kidney disease.  Hypertension: Blood pressure reasonable at this time.  Continue carvedilol 6.25 mg twice daily.  TIMI Risk Score for Unstable Angina or Non-ST Elevation MI:   The patient's TIMI risk score  is 2, which indicates a 8% risk of all cause mortality, new or recurrent myocardial infarction or need for urgent revascularization in the next 14 days.  For questions or updates, please contact [facility] Please consult [email or web address] for contact info under    Signed, Yvonne Kendall, MD  09/08/2020 12:57 PM

## 2020-09-08 NOTE — Progress Notes (Signed)
*  PRELIMINARY RESULTS* °Echocardiogram °2D Echocardiogram has been performed. ° °Yolanda Moore °09/08/2020, 1:03 PM °

## 2020-09-08 NOTE — Progress Notes (Signed)
Patient ID: Yolanda Moore, female   DOB: 27-Jan-1946, 74 y.o.   MRN: [id number] [facility] PROGRESS NOTE  KRISTON KENDER AJH:[id number] DOB: May 15, 1946 DOA: 09/07/2020 PCP: Marisue Ivan, MD  HPI/Subjective: Patient not the best historian with history of dementia.  She feels okay.  As per family in the room, she was losing her balance and it happened a few times yesterday.  She could not get out of the car secondary to weakness.  She was flushed and sweaty and family brought her into the hospital.  In the ER she was found to have a rising troponin.  No complaints of chest pain or shortness of breath.  Objective: Vitals:   09/08/20 0716 09/08/20 0907  BP: 130/62 136/67  Pulse: 67 65  Resp: 16 16  Temp: 98.4 F (36.9 C) 98.7 F (37.1 C)  SpO2: 99% 100%   No intake or output data in the 24 hours ending 09/08/20 1328 Filed Weights   09/07/20 2351 09/08/20 0150 09/08/20 0632  Weight: 74.8 kg 74.8 kg 74.8 kg    ROS: Review of Systems  Respiratory: Negative for cough and shortness of breath.   Cardiovascular: Negative for chest pain.  Gastrointestinal: Negative for abdominal pain, nausea and vomiting.   Exam: Physical Exam HENT:     Head: Normocephalic.     Mouth/Throat:     Pharynx: No oropharyngeal exudate.  Eyes:     General: Lids are normal.     Extraocular Movements: Extraocular movements intact.     Conjunctiva/sclera: Conjunctivae normal.     Pupils: Pupils are equal, round, and reactive to light.  Cardiovascular:     Rate and Rhythm: Normal rate and regular rhythm.     Heart sounds: Normal heart sounds, S1 normal and S2 normal.  Pulmonary:     Breath sounds: No decreased breath sounds, wheezing, rhonchi or rales.  Abdominal:     Palpations: Abdomen is soft.     Tenderness: There is no abdominal tenderness.  Musculoskeletal:     Right ankle: Swelling present.     Left ankle: No swelling.  Skin:    General: Skin is warm.     Findings: No rash.   Neurological:     Mental Status: She is alert.     Comments: Answers some yes or no questions and follows commands. Power 5 out of 5 bilateral upper and lower extremities.       Data Reviewed: Basic Metabolic Panel: Recent Labs  Lab 09/07/20 2008 09/08/20 1119  NA 138 138  K 4.2 4.5  CL 102 104  CO2 22 26  GLUCOSE 88 106*  BUN 22 18  CREATININE 1.76* 1.40*  CALCIUM 10.3 9.4   Liver Function Tests: Recent Labs  Lab 09/07/20 2008  AST 30  ALT 16  ALKPHOS 59  BILITOT 0.6  PROT 8.7*  ALBUMIN 4.8   CBC: Recent Labs  Lab 09/07/20 2008  WBC 11.7*  HGB 13.5  HCT 41.4  MCV 86.6  PLT 401*     Recent Results (from the past 240 hour(s))  Respiratory Panel by RT PCR (Flu A&B, Covid) - Nasopharyngeal Swab     Status: None   Collection Time: 09/07/20 11:49 PM   Specimen: Nasopharyngeal Swab  Result Value Ref Range Status   SARS Coronavirus 2 by RT PCR NEGATIVE NEGATIVE Final    Comment: (NOTE) SARS-CoV-2 target nucleic acids are NOT DETECTED.  The SARS-CoV-2 RNA is generally detectable in upper respiratoy specimens during the acute phase  of infection. The lowest concentration of SARS-CoV-2 viral copies this assay can detect is 131 copies/mL. A negative result does not preclude SARS-Cov-2 infection and should not be used as the sole basis for treatment or other patient management decisions. A negative result may occur with  improper specimen collection/handling, submission of specimen other than nasopharyngeal swab, presence of viral mutation(s) within the areas targeted by this assay, and inadequate number of viral copies (<131 copies/mL). A negative result must be combined with clinical observations, patient history, and epidemiological information. The expected result is Negative.  Fact Sheet for Patients:  https://www.moore.com/  Fact Sheet for Healthcare Providers:  https://www.young.biz/  This test is no t yet  approved or cleared by the [place] FDA and  has been authorized for detection and/or diagnosis of SARS-CoV-2 by FDA under an Emergency Use Authorization (EUA). This EUA will remain  in effect (meaning this test can be used) for the duration of the COVID-19 declaration under Section 564(b)(1) of the Act, 21 U.S.C. section 360bbb-3(b)(1), unless the authorization is terminated or revoked sooner.     Influenza A by PCR NEGATIVE NEGATIVE Final   Influenza B by PCR NEGATIVE NEGATIVE Final    Comment: (NOTE) The Xpert Xpress SARS-CoV-2/FLU/RSV assay is intended as an aid in  the diagnosis of influenza from Nasopharyngeal swab specimens and  should not be used as a sole basis for treatment. Nasal washings and  aspirates are unacceptable for Xpert Xpress SARS-CoV-2/FLU/RSV  testing.  Fact Sheet for Patients: https://www.moore.com/  Fact Sheet for Healthcare Providers: https://www.young.biz/  This test is not yet approved or cleared by the [place] FDA and  has been authorized for detection and/or diagnosis of SARS-CoV-2 by  FDA under an Emergency Use Authorization (EUA). This EUA will remain  in effect (meaning this test can be used) for the duration of the  Covid-19 declaration under Section 564(b)(1) of the Act, 21  U.S.C. section 360bbb-3(b)(1), unless the authorization is  terminated or revoked. Performed at [facility], [address] [place], [place] [address]      Studies: CT Head Wo Contrast  Result Date: 09/07/2020 CLINICAL DATA:  Weakness EXAM: CT HEAD WITHOUT CONTRAST TECHNIQUE: Contiguous axial images were obtained from the base of the skull through the vertex without intravenous contrast. COMPARISON:  None. FINDINGS: Brain: Mild atrophic changes and chronic white matter ischemic changes are seen. No findings to suggest acute hemorrhage, acute infarction or space-occupying mass lesion are noted. Vascular: No  hyperdense vessel or unexpected calcification. Skull: Normal. Negative for fracture or focal lesion. Sinuses/Orbits: No acute finding. Other: None. IMPRESSION: Chronic atrophic and ischemic changes without acute abnormality. Electronically Signed   By: Alcide Clever M.D.   On: 09/07/2020 21:04   DG Chest Portable 1 View  Result Date: 09/07/2020 CLINICAL DATA:  Weakness, inability to ambulate, dimension EXAM: PORTABLE CHEST 1 VIEW COMPARISON:  None. FINDINGS: The heart size and mediastinal contours are within normal limits. Both lungs are clear. The visualized skeletal structures are unremarkable. IMPRESSION: No active disease. Electronically Signed   By: Sharlet Salina M.D.   On: 09/07/2020 22:23   ECHOCARDIOGRAM COMPLETE  Result Date: 09/08/2020    ECHOCARDIOGRAM REPORT   Patient Name:   AIRYN ELLZEY Date of Exam: 09/08/2020 Medical Rec #:  [id number]   Height:       60.0 in Accession #:    [id number]  Weight:       164.9 lb Date of Birth:  [birth year]  BSA:  1.720 m Patient Age:    74 years    BP:           136/67 mmHg Patient Gender: F           HR:           59 bpm. Exam Location:  [facility] Procedure: 2D Echo, Color Doppler, Cardiac Doppler and Intracardiac            Opacification Agent Indications:     I21.4 NSTEMI  History:         Patient has no prior history of Echocardiogram examinations.                  Risk Factors:Hypertension. Dementia.  Sonographer:     Humphrey Rolls RDCS (AE) Referring Phys:  [id number] CHRISTOPHER END Diagnosing Phys: Cristal Deer End MD  Sonographer Comments: Suboptimal apical window and suboptimal subcostal window. IMPRESSIONS  1. 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 Left ventricular ejection fraction, by estimation, is 65 to 70%. The left ventricle has normal function. The left ventricle has no regional wall motion abnormalities. Left ventricular diastolic  parameters are consistent with Grade II diastolic dysfunction (pseudonormalization).  2. Right ventricular systolic function is normal. The right ventricular size is normal. Mildly increased right ventricular wall thickness.  3. The mitral valve is normal in structure. Mild mitral valve regurgitation. No evidence of mitral stenosis.  4. The aortic valve was not well visualized. Aortic valve regurgitation is not visualized. No aortic stenosis is present.  5. The inferior vena cava is normal in size with greater than 50% respiratory variability, suggesting right atrial pressure of 3 mmHg. FINDINGS  Left Ventricle: There is complete obliteration of the apical portion of the left ventricular cavity, which can be seen with apical variant of hypertrophic cardiomyopathy. Left ventricular ejection fraction, by estimation, is 65 to 70%. The left ventricle has normal function. The left ventricle has no regional wall motion abnormalities. Definity contrast agent was given IV to delineate the left ventricular endocardial borders. The left ventricular internal cavity size was normal in size. There is borderline left ventricular hypertrophy. Left ventricular diastolic parameters are consistent with Grade II diastolic dysfunction (pseudonormalization). Right Ventricle: The right ventricular size is normal. Mildly increased right ventricular wall thickness. Right ventricular systolic function is normal. Left Atrium: Left atrial size was normal in size. Right Atrium: Right atrial size was not well visualized. Pericardium: The pericardium was not well visualized. Presence of pericardial fat pad. Mitral Valve: The mitral valve is normal in structure. Mild mitral valve regurgitation. No evidence of mitral valve stenosis. MV peak gradient, 1.4 mmHg. The mean mitral valve gradient is 1.0 mmHg. Tricuspid Valve: The tricuspid valve is not well visualized. Tricuspid valve regurgitation is not demonstrated. Aortic Valve: The aortic valve was  not well visualized. Aortic valve regurgitation is not visualized. No aortic stenosis is present. Aortic valve mean gradient measures 2.0 mmHg. Aortic valve peak gradient measures 4.5 mmHg. Aortic valve area, by VTI measures 2.76 cm. Pulmonic Valve: The pulmonic valve was not well visualized. Pulmonic valve regurgitation is not visualized. No evidence of pulmonic stenosis. Aorta: The aortic root is normal in size and structure. Pulmonary Artery: The pulmonary artery is not well seen. Venous: The inferior vena cava is normal in size with greater than 50% respiratory variability, suggesting right atrial pressure of 3 mmHg. IAS/Shunts: The interatrial septum was not well visualized.  LEFT VENTRICLE PLAX 2D LVIDd:         4.07 cm  Diastology LVIDs:         2.87 cm  LV e' medial:    6.31 cm/s LV PW:         1.03 cm  LV E/e' medial:  8.9 LV IVS:        0.70 cm  LV e' lateral:   7.94 cm/s LVOT diam:     1.80 cm  LV E/e' lateral: 7.1 LV SV:         55 LV SV Index:   32 LVOT Area:     2.54 cm  LEFT ATRIUM             Index LA diam:        3.00 cm 1.74 cm/m LA Vol (A2C):   43.7 ml 25.41 ml/m LA Vol (A4C):   35.0 ml 20.35 ml/m LA Biplane Vol: 40.1 ml 23.32 ml/m  AORTIC VALVE                   PULMONIC VALVE AV Area (Vmax):    2.42 cm    PV Vmax:       0.75 m/s AV Area (Vmean):   2.40 cm    PV Vmean:      53.200 cm/s AV Area (VTI):     2.76 cm    PV VTI:        0.161 m AV Vmax:           106.00 cm/s PV Peak grad:  2.2 mmHg AV Vmean:          66.700 cm/s PV Mean grad:  1.0 mmHg AV VTI:            0.199 m AV Peak Grad:      4.5 mmHg AV Mean Grad:      2.0 mmHg LVOT Vmax:         101.00 cm/s LVOT Vmean:        62.800 cm/s LVOT VTI:          0.216 m LVOT/AV VTI ratio: 1.09  AORTA Ao Root diam: 3.00 cm MITRAL VALVE MV Area (PHT): 3.42 cm    SHUNTS MV Peak grad:  1.4 mmHg    Systemic VTI:  0.22 m MV Mean grad:  1.0 mmHg    Systemic Diam: 1.80 cm MV Vmax:       0.59 m/s MV Vmean:      32.3 cm/s MV Decel Time: 222 msec MV E  velocity: 56.30 cm/s MV A velocity: 40.30 cm/s MV E/A ratio:  1.40 Cristal Deer End MD Electronically signed by Yvonne Kendall MD Signature Date/Time: 09/08/2020/1:25:09 PM    Final     Scheduled Meds: . Melene Muller ON 09/09/2020] aspirin EC  81 mg Oral Daily  . atorvastatin  40 mg Oral Daily  . carvedilol  6.25 mg Oral BID WC  . donepezil  10 mg Oral QHS   Continuous Infusions: . sodium chloride 1,000 mL (09/08/20 0155)  . heparin 800 Units/hr (09/08/20 1157)    Assessment/Plan:  1. Acute kidney injury on chronic kidney disease stage IIIa.  IV fluid hydration hold hydrochlorothiazide and Cozaar for right now.  Check a BMP again in the morning.  Check orthostatic vital signs. 2. Weakness could be secondary to dehydration.  Initial CT scan of the head negative.  Check orthostatic vital signs. 3. Elevated troponin.  Seen in consultation by cardiology.  Placed on heparin  drip by admitting physician.  Cardiology will evaluate tomorrow morning to see whether or not they want to do a stress test or not.  Echocardiogram showed a elevated EF.  Aspirin and atorvastatin added.  Patient placed on Coreg. 4. Dementia on Aricept        Code Status:     Code Status Orders  (From admission, onward)         Start     Ordered   09/08/20 0016  Full code  Continuous        09/08/20 0017        Code Status History    This patient has a current code status but no historical code status.   Advance Care Planning Activity    Advance Directive Documentation     Most Recent Value  Type of Advance Directive Healthcare Power of Attorney  Pre-existing out of facility DNR order (yellow form or pink MOST form) --  "MOST" Form in Place? --     Family Communication: Family at bedside Disposition Plan: Status is: Observation  Dispo: The patient is from: Home              Anticipated d/c is to: Home              Anticipated d/c date is: Hopefully home on 09/09/2020              Patient currently being  watched closely by cardiology for elevated troponin.  Being hydrated for acute kidney injury on chronic kidney disease.  Consultants:  Cardiology  Time spent: 35 minutes  Dahlton [employer]

## 2020-09-08 NOTE — Progress Notes (Signed)
Patient admitted to floor from ED. VSS. A/ox1.

## 2020-09-08 NOTE — H&P (Signed)
History and Physical    Yolanda Moore VFI:[id number] DOB: [birth year] DOA: 09/07/2020  PCP: Marisue Ivan, MD   Patient coming from: Home  I have personally briefly reviewed patient's old medical records in [facility]  Chief Complaint: Weakness  HPI: Yolanda Moore is a 74 y.o. female with medical history significant for hypertension and dementia, was brought to the emergency room with a complaint of weakness and diaphoresis that started at around 1500 in the afternoon.  She denied chest pain, nausea or vomiting.  History is limited due to dementia.  She was previously in her usual state of health and had no recent illness, no cough or shortness of breath.  She had no one-sided weakness of the face or extremities. ED Course: On arrival, vitals were within normal limits.  Blood work significant for troponin of 163.  EKG as interpreted by me shows T wave inversion in the lateral leads including D2-D3 and F as well as V4 V5 V6 Other blood work unremarkable.  Patient given chewable aspirin was started on a heparin infusion.  Hospitalist consulted for admission  Review of Systems: Unable to obtain due to patient's dementia   Past Medical History:  Diagnosis Date  . Dementia (HCC)   . HTN (hypertension)     History reviewed. No pertinent surgical history.   reports that she has been smoking. She has never used smokeless tobacco. She reports previous alcohol use. She reports that she does not use drugs.  No Known Allergies  Family history: Unable to be reviewed due to dementia   Prior to Admission medications   Medication Sig Start Date End Date Taking? Authorizing Provider  donepezil (ARICEPT) 10 MG tablet Take 10 mg by mouth at bedtime. 04/10/20  Yes [provider]  hydrochlorothiazide (HYDRODIURIL) 25 MG tablet Take 25 mg by mouth daily. 08/21/20  Yes [provider]  losartan (COZAAR) 100 MG tablet Take 100 mg by mouth daily. 02/14/20  Yes [provider]    Physical Exam: Vitals:   09/07/20 2004 09/07/20 2351  BP: (!) 135/35 140/65  Pulse: 69 63  Resp: 18 18  Temp: 98.3 F (36.8 C) (!) 97 F (36.1 C)  TempSrc: Oral Oral  SpO2: 100% 99%  Weight: 74.8 kg 74.8 kg  Height:  5' (1.524 m)     Vitals:   09/07/20 2004 09/07/20 2351  BP: (!) 135/35 140/65  Pulse: 69 63  Resp: 18 18  Temp: 98.3 F (36.8 C) (!) 97 F (36.1 C)  TempSrc: Oral Oral  SpO2: 100% 99%  Weight: 74.8 kg 74.8 kg  Height:  5' (1.524 m)      Constitutional:  Alert, pleasantly confused, oriented x1. Not in any apparent distress HEENT:      Head: Normocephalic and atraumatic.         Eyes: PERLA, EOMI, Conjunctivae are normal. Sclera is non-icteric.       Mouth/Throat: Mucous membranes are moist.       Neck: Supple with no signs of meningismus. Cardiovascular: Regular rate and rhythm. No murmurs, gallops, or rubs. 2+ symmetrical distal pulses are present . No JVD. No LE edema Respiratory: Respiratory effort normal .Lungs sounds clear bilaterally. No wheezes, crackles, or rhonchi.  Gastrointestinal: Soft, non tender, and non distended with positive bowel sounds. No rebound or guarding. Genitourinary: No CVA tenderness. Musculoskeletal: Nontender with normal range of motion in all extremities. No cyanosis, or erythema of extremities. Neurologic:  Face is symmetric. Moving all extremities.  No gross focal neurologic deficits . Skin: Skin is warm, dry.  No rash or ulcers Psychiatric: Mood and affect are normal    Labs on Admission: I have personally reviewed following labs and imaging studies  CBC: Recent Labs  Lab 09/07/20 2008  WBC 11.7*  HGB 13.5  HCT 41.4  MCV 86.6  PLT 401*   Basic Metabolic Panel: Recent Labs  Lab 09/07/20 2008  NA 138  K 4.2  CL 102  CO2 22  GLUCOSE 88  BUN 22  CREATININE 1.76*  CALCIUM 10.3   GFR: Estimated Creatinine Clearance: 25.3 mL/min (A) (by C-G formula based on SCr of 1.76 mg/dL (H)). Liver Function  Tests: Recent Labs  Lab 09/07/20 2008  AST 30  ALT 16  ALKPHOS 59  BILITOT 0.6  PROT 8.7*  ALBUMIN 4.8   No results for input(s): LIPASE, AMYLASE in the last 168 hours. No results for input(s): AMMONIA in the last 168 hours. Coagulation Profile: No results for input(s): INR, PROTIME in the last 168 hours. Cardiac Enzymes: No results for input(s): CKTOTAL, CKMB, CKMBINDEX, TROPONINI in the last 168 hours. BNP (last 3 results) No results for input(s): PROBNP in the last 8760 hours. HbA1C: No results for input(s): HGBA1C in the last 72 hours. CBG: No results for input(s): GLUCAP in the last 168 hours. Lipid Profile: No results for input(s): CHOL, HDL, LDLCALC, TRIG, CHOLHDL, LDLDIRECT in the last 72 hours. Thyroid Function Tests: No results for input(s): TSH, T4TOTAL, FREET4, T3FREE, THYROIDAB in the last 72 hours. Anemia Panel: No results for input(s): VITAMINB12, FOLATE, FERRITIN, TIBC, IRON, RETICCTPCT in the last 72 hours. Urine analysis: No results found for: COLORURINE, APPEARANCEUR, LABSPEC, PHURINE, GLUCOSEU, HGBUR, BILIRUBINUR, KETONESUR, PROTEINUR, UROBILINOGEN, NITRITE, LEUKOCYTESUR  Radiological Exams on Admission: CT Head Wo Contrast  Result Date: 09/07/2020 CLINICAL DATA:  Weakness EXAM: CT HEAD WITHOUT CONTRAST TECHNIQUE: Contiguous axial images were obtained from the base of the skull through the vertex without intravenous contrast. COMPARISON:  None. FINDINGS: Brain: Mild atrophic changes and chronic white matter ischemic changes are seen. No findings to suggest acute hemorrhage, acute infarction or space-occupying mass lesion are noted. Vascular: No hyperdense vessel or unexpected calcification. Skull: Normal. Negative for fracture or focal lesion. Sinuses/Orbits: No acute finding. Other: None. IMPRESSION: Chronic atrophic and ischemic changes without acute abnormality. Electronically Signed   By: Alcide Clever M.D.   On: 09/07/2020 21:04   DG Chest Portable 1  View  Result Date: 09/07/2020 CLINICAL DATA:  Weakness, inability to ambulate, dimension EXAM: PORTABLE CHEST 1 VIEW COMPARISON:  None. FINDINGS: The heart size and mediastinal contours are within normal limits. Both lungs are clear. The visualized skeletal structures are unremarkable. IMPRESSION: No active disease. Electronically Signed   By: Sharlet Salina M.D.   On: 09/07/2020 22:23     Assessment/Plan 74 year old female with history of hypertension and dementia, presenting with a complaint of weakness and diaphoresis that started at around 1500 in the afternoon.    NSTEMI (non-ST elevated myocardial infarction) [facility]) -Patient presented with generalized weakness and diaphoresis.   elevated troponin and T wave inversion on lateral leads -Continue heparin infusion -Aspirin, carvedilol and atorvastatin -Nitroglycerin sublingual as needed chest pain -Cardiology consult  Generalized weakness -Suspect related to NSTEMI above -CT head negative for acute stroke on physical exam nonfocal -Gentle IV hydration -Get urinalysis    Essential hypertension -Blood pressure borderline low so will hold home lisinopril    Chronic kidney disease, stage 3b (HCC) -Renal function at baseline  Alzheimer disease (HCC) -No behavioral disturbance -Continue donezepil    DVT prophylaxis: Heparin drip Code Status: full code  Family Communication:  none  Disposition Plan: Back to previous home environment Consults called: Cardiology status: Observation    Andris Baumann MD [facility]     09/08/2020, 12:17 AM

## 2020-09-08 NOTE — Plan of Care (Signed)
  Problem: Education: Goal: Understanding of cardiac disease, CV risk reduction, and recovery process will improve Outcome: Progressing Goal: Understanding of medication regimen will improve Outcome: Progressing Goal: Individualized Educational Video(s) Outcome: Progressing   Problem: Activity: Goal: Ability to tolerate increased activity will improve Outcome: Progressing   Problem: Cardiac: Goal: Ability to achieve and maintain adequate cardiopulmonary perfusion will improve Outcome: Progressing   

## 2020-09-09 ENCOUNTER — Telehealth: Payer: Self-pay | Admitting: Physician Assistant

## 2020-09-09 ENCOUNTER — Observation Stay (HOSPITAL_BASED_OUTPATIENT_CLINIC_OR_DEPARTMENT_OTHER): Payer: Medicare PPO

## 2020-09-09 ENCOUNTER — Encounter: Payer: Self-pay | Admitting: Internal Medicine

## 2020-09-09 DIAGNOSIS — F0390 Unspecified dementia without behavioral disturbance: Secondary | ICD-10-CM | POA: Diagnosis not present

## 2020-09-09 DIAGNOSIS — R9431 Abnormal electrocardiogram [ECG] [EKG]: Secondary | ICD-10-CM | POA: Diagnosis not present

## 2020-09-09 DIAGNOSIS — I214 Non-ST elevation (NSTEMI) myocardial infarction: Principal | ICD-10-CM

## 2020-09-09 DIAGNOSIS — N1832 Chronic kidney disease, stage 3b: Secondary | ICD-10-CM

## 2020-09-09 DIAGNOSIS — R778 Other specified abnormalities of plasma proteins: Secondary | ICD-10-CM

## 2020-09-09 DIAGNOSIS — I10 Essential (primary) hypertension: Secondary | ICD-10-CM | POA: Diagnosis not present

## 2020-09-09 LAB — NM MYOCAR MULTI W/SPECT W/WALL MOTION / EF
Estimated workload: 1 METS
Exercise duration (min): 0 min
Exercise duration (sec): 0 s
LV dias vol: 77 mL (ref 46–106)
LV sys vol: 28 mL
MPHR: 146 {beats}/min
Peak HR: 86 {beats}/min
Percent HR: 58 %
Rest HR: 56 {beats}/min
SDS: 0
SRS: 21
SSS: 2
TID: 1

## 2020-09-09 LAB — CBC
HCT: 35.4 % — ABNORMAL LOW (ref 36.0–46.0)
Hemoglobin: 11.7 g/dL — ABNORMAL LOW (ref 12.0–15.0)
MCH: 28.4 pg (ref 26.0–34.0)
MCHC: 33.1 g/dL (ref 30.0–36.0)
MCV: 85.9 fL (ref 80.0–100.0)
Platelets: 385 10*3/uL (ref 150–400)
RBC: 4.12 MIL/uL (ref 3.87–5.11)
RDW: 15.5 % (ref 11.5–15.5)
WBC: 7.1 10*3/uL (ref 4.0–10.5)
nRBC: 0 % (ref 0.0–0.2)

## 2020-09-09 LAB — BASIC METABOLIC PANEL
Anion gap: 8 (ref 5–15)
BUN: 21 mg/dL (ref 8–23)
CO2: 23 mmol/L (ref 22–32)
Calcium: 9.1 mg/dL (ref 8.9–10.3)
Chloride: 107 mmol/L (ref 98–111)
Creatinine, Ser: 1.25 mg/dL — ABNORMAL HIGH (ref 0.44–1.00)
GFR, Estimated: 45 mL/min — ABNORMAL LOW (ref >60)
Glucose, Bld: 107 mg/dL — ABNORMAL HIGH (ref 70–99)
Potassium: 3.6 mmol/L (ref 3.5–5.1)
Sodium: 138 mmol/L (ref 135–145)

## 2020-09-09 LAB — LIPID PANEL
Cholesterol: 207 mg/dL — ABNORMAL HIGH (ref 0–200)
HDL: 79 mg/dL (ref >40)
LDL Cholesterol: 112 mg/dL — ABNORMAL HIGH (ref 0–99)
Total CHOL/HDL Ratio: 2.6 RATIO
Triglycerides: 80 mg/dL (ref <150)
VLDL: 16 mg/dL (ref 0–40)

## 2020-09-09 MED ORDER — TECHNETIUM TC 99M TETROFOSMIN IV KIT
30.0000 | PACK | Freq: Once | INTRAVENOUS | Status: AC | PRN
  Administered 2020-09-09: 30.447 via INTRAVENOUS

## 2020-09-09 MED ORDER — TECHNETIUM TC 99M TETROFOSMIN IV KIT
10.0000 | PACK | Freq: Once | INTRAVENOUS | Status: AC | PRN
  Administered 2020-09-09: 10.16 via INTRAVENOUS

## 2020-09-09 MED ORDER — REGADENOSON 0.4 MG/5ML IV SOLN
0.4000 mg | Freq: Once | INTRAVENOUS | Status: AC
  Administered 2020-09-09: 0.4 mg via INTRAVENOUS

## 2020-09-09 NOTE — Telephone Encounter (Signed)
   Call placed to daughter, [name], this morning in order to discuss goals of care given her mother's dementia.  Unable to reach [place] and voicemail box not yet set up.  Will attempt contact later today.  Further recommendations at that time.  Signed, Lennon Alstrom, PA-C 09/09/2020, 9:08 AM

## 2020-09-09 NOTE — Evaluation (Signed)
Physical Therapy Evaluation Patient Details Name: Yolanda Moore MRN: [id number] DOB: 05-08-1946 Today's Date: 09/09/2020   History of Present Illness  Pt is a 74 y.o. female with medical history significant for hypertension, CKD, and dementia brought to the emergency room with a complaint of weakness and diaphoresis. MD assessment includes: Elevated troponins, acute kidney injury on chronic kidney disease stage IIIa, and weakness.    Clinical Impression  Pt was pleasant and motivated to participate during the session.  Pt put forth good effort throughout the session and ultimately required no physical assistance with any functional task.  Pt was steady with transfers as well as gait.  Pt ambulated with mildly reduced cadence with a RW with no adverse symptoms and with SpO2 and HR WNL.  Pt has a history of recent falls secondary to weakness but presented with no LOB, instability, or LE buckling with limited amb this session.  Pt will benefit from HHPT services upon discharge to safely address deficits listed in patient problem list for decreased caregiver assistance and eventual return to PLOF.      Follow Up Recommendations Home health PT;Supervision - Intermittent    Equipment Recommendations  None recommended by PT    Recommendations for Other Services       Precautions / Restrictions Precautions Precautions: Fall Restrictions Weight Bearing Restrictions: No      Mobility  Bed Mobility Overal bed mobility: Independent             General bed mobility comments: Good speed and effort with bed mobility tasks    Transfers Overall transfer level: Needs assistance Equipment used: Rolling walker (2 wheeled) Transfers: Sit to/from Stand Sit to Stand: Supervision         General transfer comment: Good eccentric and concentric control and stability with transfers from various height surfaces  Ambulation/Gait Ambulation/Gait assistance: Supervision Gait Distance (Feet): 30  Feet Assistive device: Rolling walker (2 wheeled) Gait Pattern/deviations: Step-through pattern;Decreased step length - right;Decreased step length - left Gait velocity: decreased   General Gait Details: Mildly decreased cadence but steady without LOB; amb distance limited by pt requiring a BM  Stairs            Wheelchair Mobility    Modified Rankin (Stroke Patients Only)       Balance Overall balance assessment: Needs assistance   Sitting balance-Leahy Scale: Normal     Standing balance support: Bilateral upper extremity supported;During functional activity Standing balance-Leahy Scale: Good                               Pertinent Vitals/Pain Pain Assessment: No/denies pain    Home Living Family/patient expects to be discharged to:: Private residence Living Arrangements: Alone Available Help at Discharge: Family;Available PRN/intermittently Type of Home: Independent living facility Home Access: Level entry     Home Layout: One level Home Equipment: Walker - 2 wheels;Walker - 4 wheels;Cane - single point Additional Comments: Dtr Wilma present and assisted with history/PLOF    Prior Function Level of Independence: Independent with assistive device(s)         Comments: Mod Ind amb limited community distances with a SPC, two falls in the last week secondary to weakness, daughters assist with ADLs     Hand Dominance        Extremity/Trunk Assessment   Upper Extremity Assessment Upper Extremity Assessment: Overall WFL for tasks assessed    Lower Extremity Assessment Lower Extremity  Assessment: Generalized weakness       Communication   Communication: No difficulties  Cognition Arousal/Alertness: Awake/alert Behavior During Therapy: WFL for tasks assessed/performed Overall Cognitive Status: History of cognitive impairments - at baseline                                        General Comments      Exercises Total  Joint Exercises Ankle Circles/Pumps: Strengthening;Both;10 reps Quad Sets: Strengthening;Both;10 reps Gluteal Sets: Strengthening;Both;10 reps Long Arc Quad: Strengthening;Both;10 reps Knee Flexion: Strengthening;Both;10 reps   Assessment/Plan    PT Assessment Patient needs continued PT services  PT Problem List Decreased strength;Decreased activity tolerance;Decreased mobility;Decreased knowledge of use of DME       PT Treatment Interventions DME instruction;Gait training;Functional mobility training;Therapeutic activities;Therapeutic exercise;Balance training;Patient/family education    PT Goals (Current goals can be found in the Care Plan section)  Acute Rehab PT Goals Patient Stated Goal: To get stronger PT Goal Formulation: With patient Time For Goal Achievement: 09/22/20 Potential to Achieve Goals: Good    Frequency Min 2X/week   Barriers to discharge        Co-evaluation               AM-PAC PT "6 Clicks" Mobility  Outcome Measure Help needed turning from your back to your side while in a flat bed without using bedrails?: None Help needed moving from lying on your back to sitting on the side of a flat bed without using bedrails?: None Help needed moving to and from a bed to a chair (including a wheelchair)?: A Little Help needed standing up from a chair using your arms (e.g., wheelchair or bedside chair)?: A Little Help needed to walk in hospital room?: A Little Help needed climbing 3-5 steps with a railing? : A Little 6 Click Score: 20    End of Session Equipment Utilized During Treatment: Gait belt Activity Tolerance: Patient tolerated treatment well Patient left: in chair;with chair alarm set;with call bell/phone within reach;Other (comment) Chiropractor) Nurse Communication: Mobility status PT Visit Diagnosis: Muscle weakness (generalized) (M62.81);Difficulty in walking, not elsewhere classified (R26.2)    Time: 8366-2947 PT Time Calculation (min)  (ACUTE ONLY): 36 min   Charges:   PT Evaluation $PT Eval Moderate Complexity: 1 Mod          D. Scott Abdul Beirne PT, DPT 09/09/20, 3:20 PM

## 2020-09-09 NOTE — Telephone Encounter (Signed)
   Daughter reached by MD and requests further ischemic work-up with MPI.  If MPI shows signs of ischemia, request is also to pursue cardiac catheterization at that time.  Signed, Lennon Alstrom, PA-C 09/09/2020, 9:42 AM

## 2020-09-09 NOTE — Progress Notes (Signed)
Ch visited with Pt's daughter. Ch met with her in the elevator and escorted her to the room. She explained to Ch about mom's heart issues, and was waiting on MD to determine if she is strong enough to get a procedure done. Ch engaged with her and provided pastoral presence, she was glad to have met with Ch, and said was feeling less anxious.

## 2020-09-09 NOTE — Progress Notes (Signed)
PROGRESS NOTE    Yolanda Moore  PZW:[id number] DOB: May 15, 1946 DOA: 09/07/2020 PCP: Marisue Ivan, MD   Assessment & Plan:   Principal Problem:   NSTEMI (non-ST elevated myocardial infarction)  [facility]) Active Problems:   Essential hypertension   Chronic kidney disease, stage 3b (HCC)   Dementia without behavioral disturbance (HCC)   Weakness   Acute kidney injury superimposed on CKD (HCC)   Elevated troponin  Elevated troponins: NSTEMI vs demand ischemia as per cardio. Continue on aspirin, carvedilol, statin. Nitro prn. MPI today as per cardio. Continue on tele. Cardio following and recs apprec  Generalized weakness: CT head neg for any acute CVA. PT/OT consulted  HTN: will continue on home dose of carvedilol. Continue to hold lisinopril   CKDIIIb: Cr is trending down from day prior. Will continue to monitor   Alzheimer disease: continue on home dose of donepezil    DVT prophylaxis: SCDs Code Status: full  Family Communication: called pt's daughter but no answer and unable to leave a voicemail  Disposition Plan: depends on PT/OT recs   Status is: Observation  The patient remains OBS appropriate and will d/c before 2 midnights.  Dispo: The patient is from: Home              Anticipated d/c is to: Home              Anticipated d/c date is: 2 days              Patient currently is not medically stable to d/c.     Consultants:   Cardio   Procedures:    Antimicrobials:   Subjective: Pt c/o fatigue   Objective: Vitals:   09/08/20 1700 09/08/20 1923 09/09/20 0439 09/09/20 0832  BP: 138/65 138/62 119/61 (!) 110/46  Pulse: 66 64 (!) 53 (!) 51  Resp:  17 17 18   Temp: 98.1 F (36.7 C) 98.9 F (37.2 C) 98.3 F (36.8 C) 98.1 F (36.7 C)  TempSrc:  Oral Oral Oral  SpO2: 100% 100% 100% 100%  Weight:   72.8 kg   Height:        Intake/Output Summary (Last 24 hours) at 09/09/2020 0842 Last data filed at 09/09/2020 0500 Gross per 24 hour  Intake 1769.04 ml    Output 751 ml  Net 1018.04 ml   Filed Weights   09/08/20 0150 09/08/20 0632 09/09/20 0439  Weight: 74.8 kg 74.8 kg 72.8 kg    Examination:  General exam: Appears calm and comfortable  Respiratory system: Clear to auscultation. Respiratory effort normal. Cardiovascular system: S1 & S2 +. No rubs, gallops or clicks.  Gastrointestinal system: Abdomen is nondistended, soft and nontender. Normal bowel sounds heard. Central nervous system: Alert and oriented to person only. Moves all 4 extremities Psychiatry: Judgement and insight appear abnormal. Flat mood and affect.     Data Reviewed: I have personally reviewed following labs and imaging studies  CBC: Recent Labs  Lab 09/07/20 2008 09/09/20 0613  WBC 11.7* 7.1  HGB 13.5 11.7*  HCT 41.4 35.4*  MCV 86.6 85.9  PLT 401* 385   Basic Metabolic Panel: Recent Labs  Lab 09/07/20 2008 09/08/20 1119 09/09/20 0613  NA 138 138 138  K 4.2 4.5 3.6  CL 102 104 107  CO2 22 26 23   GLUCOSE 88 106* 107*  BUN 22 18 21   CREATININE 1.76* 1.40* 1.25*  CALCIUM 10.3 9.4 9.1   GFR: Estimated Creatinine Clearance: 35.2 mL/min (A) (by C-G formula based on SCr  of 1.25 mg/dL (H)). Liver Function Tests: Recent Labs  Lab 09/07/20 2008  AST 30  ALT 16  ALKPHOS 59  BILITOT 0.6  PROT 8.7*  ALBUMIN 4.8   No results for input(s): LIPASE, AMYLASE in the last 168 hours. No results for input(s): AMMONIA in the last 168 hours. Coagulation Profile: Recent Labs  Lab 09/07/20 2349  INR 1.0   Cardiac Enzymes: No results for input(s): CKTOTAL, CKMB, CKMBINDEX, TROPONINI in the last 168 hours. BNP (last 3 results) No results for input(s): PROBNP in the last 8760 hours. HbA1C: No results for input(s): HGBA1C in the last 72 hours. CBG: No results for input(s): GLUCAP in the last 168 hours. Lipid Profile: Recent Labs    09/09/20 0613  CHOL 207*  HDL 79  LDLCALC 112*  TRIG 80  CHOLHDL 2.6   Thyroid Function Tests: No results for  input(s): TSH, T4TOTAL, FREET4, T3FREE, THYROIDAB in the last 72 hours. Anemia Panel: No results for input(s): VITAMINB12, FOLATE, FERRITIN, TIBC, IRON, RETICCTPCT in the last 72 hours. Sepsis Labs: No results for input(s): PROCALCITON, LATICACIDVEN in the last 168 hours.  Recent Results (from the past 240 hour(s))  Respiratory Panel by RT PCR (Flu A&B, Covid) - Nasopharyngeal Swab     Status: None   Collection Time: 09/07/20 11:49 PM   Specimen: Nasopharyngeal Swab  Result Value Ref Range Status   SARS Coronavirus 2 by RT PCR NEGATIVE NEGATIVE Final    Comment: (NOTE) SARS-CoV-2 target nucleic acids are NOT DETECTED.  The SARS-CoV-2 RNA is generally detectable in upper respiratoy specimens during the acute phase of infection. The lowest concentration of SARS-CoV-2 viral copies this assay can detect is 131 copies/mL. A negative result does not preclude SARS-Cov-2 infection and should not be used as the sole basis for treatment or other patient management decisions. A negative result may occur with  improper specimen collection/handling, submission of specimen other than nasopharyngeal swab, presence of viral mutation(s) within the areas targeted by this assay, and inadequate number of viral copies (<131 copies/mL). A negative result must be combined with clinical observations, patient history, and epidemiological information. The expected result is Negative.  Fact Sheet for Patients:  https://www.moore.com/  Fact Sheet for Healthcare Providers:  https://www.young.biz/  This test is no t yet approved or cleared by the [place] FDA and  has been authorized for detection and/or diagnosis of SARS-CoV-2 by FDA under an Emergency Use Authorization (EUA). This EUA will remain  in effect (meaning this test can be used) for the duration of the COVID-19 declaration under Section 564(b)(1) of the Act, 21 U.S.C. section 360bbb-3(b)(1), unless the  authorization is terminated or revoked sooner.     Influenza A by PCR NEGATIVE NEGATIVE Final   Influenza B by PCR NEGATIVE NEGATIVE Final    Comment: (NOTE) The Xpert Xpress SARS-CoV-2/FLU/RSV assay is intended as an aid in  the diagnosis of influenza from Nasopharyngeal swab specimens and  should not be used as a sole basis for treatment. Nasal washings and  aspirates are unacceptable for Xpert Xpress SARS-CoV-2/FLU/RSV  testing.  Fact Sheet for Patients: https://www.moore.com/  Fact Sheet for Healthcare Providers: https://www.young.biz/  This test is not yet approved or cleared by the [place] FDA and  has been authorized for detection and/or diagnosis of SARS-CoV-2 by  FDA under an Emergency Use Authorization (EUA). This EUA will remain  in effect (meaning this test can be used) for the duration of the  Covid-19 declaration under Section 564(b)(1) of the  Act, 21  U.S.C. section 360bbb-3(b)(1), unless the authorization is  terminated or revoked. Performed at [facility], [address], [place], [place] [address]          Radiology Studies: CT Head Wo Contrast  Result Date: 09/07/2020 CLINICAL DATA:  Weakness EXAM: CT HEAD WITHOUT CONTRAST TECHNIQUE: Contiguous axial images were obtained from the base of the skull through the vertex without intravenous contrast. COMPARISON:  None. FINDINGS: Brain: Mild atrophic changes and chronic white matter ischemic changes are seen. No findings to suggest acute hemorrhage, acute infarction or space-occupying mass lesion are noted. Vascular: No hyperdense vessel or unexpected calcification. Skull: Normal. Negative for fracture or focal lesion. Sinuses/Orbits: No acute finding. Other: None. IMPRESSION: Chronic atrophic and ischemic changes without acute abnormality. Electronically Signed   By: Alcide Clever M.D.   On: 09/07/2020 21:04   DG Chest Portable 1 View  Result Date:  09/07/2020 CLINICAL DATA:  Weakness, inability to ambulate, dimension EXAM: PORTABLE CHEST 1 VIEW COMPARISON:  None. FINDINGS: The heart size and mediastinal contours are within normal limits. Both lungs are clear. The visualized skeletal structures are unremarkable. IMPRESSION: No active disease. Electronically Signed   By: Sharlet Salina M.D.   On: 09/07/2020 22:23   ECHOCARDIOGRAM COMPLETE  Result Date: 09/08/2020    ECHOCARDIOGRAM REPORT   Patient Name:   ADAYAH AROCHO Date of Exam: 09/08/2020 Medical Rec #:  [id number]   Height:       60.0 in Accession #:    [id number]  Weight:       164.9 lb Date of Birth:  1946/02/12  BSA:          1.720 m Patient Age:    74 years    BP:           136/67 mmHg Patient Gender: F           HR:           59 bpm. Exam Location:  [facility] Procedure: 2D Echo, Color Doppler, Cardiac Doppler and Intracardiac            Opacification Agent Indications:     I21.4 NSTEMI  History:         Patient has no prior history of Echocardiogram examinations.                  Risk Factors:Hypertension. Dementia.  Sonographer:     Humphrey Rolls RDCS (AE) Referring Phys:  [id number] CHRISTOPHER END Diagnosing Phys: Cristal Deer End MD  Sonographer Comments: Suboptimal apical window and suboptimal subcostal window. IMPRESSIONS  1. 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 Left ventricular ejection fraction, by estimation, is 65 to 70%. The left ventricle has normal function. The left ventricle has no regional wall motion abnormalities. Left ventricular diastolic parameters are consistent with Grade II diastolic dysfunction (pseudonormalization).  2. Right ventricular systolic function is normal. The right ventricular size is normal. Mildly increased right ventricular wall thickness.  3. The mitral valve is normal in structure. Mild mitral valve regurgitation. No evidence of mitral stenosis.  4. The  aortic valve was not well visualized. Aortic valve regurgitation is not visualized. No aortic stenosis is present.  5. The inferior vena cava is normal in size with greater than 50% respiratory variability, suggesting right atrial pressure of 3 mmHg. FINDINGS  Left Ventricle: There is complete obliteration of the apical portion of the left  ventricular cavity, which can be seen with apical variant of hypertrophic cardiomyopathy. Left ventricular ejection fraction, by estimation, is 65 to 70%. The left ventricle has normal function. The left ventricle has no regional wall motion abnormalities. Definity contrast agent was given IV to delineate the left ventricular endocardial borders. The left ventricular internal cavity size was normal in size. There is borderline left ventricular hypertrophy. Left ventricular diastolic parameters are consistent with Grade II diastolic dysfunction (pseudonormalization). Right Ventricle: The right ventricular size is normal. Mildly increased right ventricular wall thickness. Right ventricular systolic function is normal. Left Atrium: Left atrial size was normal in size. Right Atrium: Right atrial size was not well visualized. Pericardium: The pericardium was not well visualized. Presence of pericardial fat pad. Mitral Valve: The mitral valve is normal in structure. Mild mitral valve regurgitation. No evidence of mitral valve stenosis. MV peak gradient, 1.4 mmHg. The mean mitral valve gradient is 1.0 mmHg. Tricuspid Valve: The tricuspid valve is not well visualized. Tricuspid valve regurgitation is not demonstrated. Aortic Valve: The aortic valve was not well visualized. Aortic valve regurgitation is not visualized. No aortic stenosis is present. Aortic valve mean gradient measures 2.0 mmHg. Aortic valve peak gradient measures 4.5 mmHg. Aortic valve area, by VTI measures 2.76 cm. Pulmonic Valve: The pulmonic valve was not well visualized. Pulmonic valve regurgitation is not visualized.  No evidence of pulmonic stenosis. Aorta: The aortic root is normal in size and structure. Pulmonary Artery: The pulmonary artery is not well seen. Venous: The inferior vena cava is normal in size with greater than 50% respiratory variability, suggesting right atrial pressure of 3 mmHg. IAS/Shunts: The interatrial septum was not well visualized.  LEFT VENTRICLE PLAX 2D LVIDd:         4.07 cm  Diastology LVIDs:         2.87 cm  LV e' medial:    6.31 cm/s LV PW:         1.03 cm  LV E/e' medial:  8.9 LV IVS:        0.70 cm  LV e' lateral:   7.94 cm/s LVOT diam:     1.80 cm  LV E/e' lateral: 7.1 LV SV:         55 LV SV Index:   32 LVOT Area:     2.54 cm  LEFT ATRIUM             Index LA diam:        3.00 cm 1.74 cm/m LA Vol (A2C):   43.7 ml 25.41 ml/m LA Vol (A4C):   35.0 ml 20.35 ml/m LA Biplane Vol: 40.1 ml 23.32 ml/m  AORTIC VALVE                   PULMONIC VALVE AV Area (Vmax):    2.42 cm    PV Vmax:       0.75 m/s AV Area (Vmean):   2.40 cm    PV Vmean:      53.200 cm/s AV Area (VTI):     2.76 cm    PV VTI:        0.161 m AV Vmax:           106.00 cm/s PV Peak grad:  2.2 mmHg AV Vmean:          66.700 cm/s PV Mean grad:  1.0 mmHg AV VTI:            0.199 m AV Peak Grad:  4.5 mmHg AV Mean Grad:      2.0 mmHg LVOT Vmax:         101.00 cm/s LVOT Vmean:        62.800 cm/s LVOT VTI:          0.216 m LVOT/AV VTI ratio: 1.09  AORTA Ao Root diam: 3.00 cm MITRAL VALVE MV Area (PHT): 3.42 cm    SHUNTS MV Peak grad:  1.4 mmHg    Systemic VTI:  0.22 m MV Mean grad:  1.0 mmHg    Systemic Diam: 1.80 cm MV Vmax:       0.59 m/s MV Vmean:      32.3 cm/s MV Decel Time: 222 msec MV E velocity: 56.30 cm/s MV A velocity: 40.30 cm/s MV E/A ratio:  1.40 Cristal Deer End MD Electronically signed by Yvonne Kendall MD Signature Date/Time: 09/08/2020/1:25:09 PM    Final         Scheduled Meds: . aspirin EC  81 mg Oral Daily  . atorvastatin  40 mg Oral Daily  . carvedilol  6.25 mg Oral BID WC  . donepezil  10 mg Oral QHS    Continuous Infusions: . sodium chloride Stopped (09/08/20 1623)     LOS: 0 days    Time spent: 30 mins    Charise Killian, MD [facility] Pager [phone]  If 7PM-7AM, please contact night-coverage [email or web address] 09/09/2020, 8:42 AM

## 2020-09-09 NOTE — Progress Notes (Signed)
Progress Note  Patient Name: Yolanda Moore Date of Encounter: 09/09/2020  [facility] Cardiologist: [facility], Dr. Okey Dupre rounding  Subjective   She denies any chest pain or shortness of breath this morning.    She continues to feel weak, which she states is why her daughters brought her to the hospital.  She has 3 daughters that she is involving in her care.  She consented to cardiology giving them a call any point time.  They will be by later this afternoon.  We discussed possible Lexiscan Myoview this admission or as an outpatient.  Inpatient Medications    Scheduled Meds: . aspirin EC  81 mg Oral Daily  . atorvastatin  40 mg Oral Daily  . carvedilol  6.25 mg Oral BID WC  . donepezil  10 mg Oral QHS   Continuous Infusions: . sodium chloride Stopped (09/08/20 1623)   PRN Meds: acetaminophen, nitroGLYCERIN, ondansetron (ZOFRAN) IV   Vital Signs    Vitals:   09/08/20 1337 09/08/20 1700 09/08/20 1923 09/09/20 0439  BP: (!) 128/59 138/65 138/62 119/61  Pulse: (!) 58 66 64 (!) 53  Resp: 16  17 17   Temp: 98.5 F (36.9 C) 98.1 F (36.7 C) 98.9 F (37.2 C) 98.3 F (36.8 C)  TempSrc: Oral  Oral Oral  SpO2: 100% 100% 100% 100%  Weight:    72.8 kg  Height:        Intake/Output Summary (Last 24 hours) at 09/09/2020 0740 Last data filed at 09/09/2020 0500 Gross per 24 hour  Intake 1769.04 ml  Output 751 ml  Net 1018.04 ml   Last 3 Weights 09/09/2020 09/08/2020 09/08/2020  Weight (lbs) 160 lb 7.9 oz 164 lb 14.5 oz 164 lb 14.5 oz  Weight (kg) 72.8 kg 74.8 kg 74.8 kg      Telemetry    NSR- Personally Reviewed  ECG    No new tracings- Personally Reviewed  Physical Exam   GEN: No acute distress.  Lying in bed.  13/12/2019. Neck: No JVD Cardiac: RRR, no murmurs, rubs, or gallops.  Respiratory: Clear to auscultation bilaterally. GI: Soft, nontender, non-distended  MS: Trace nonpitting lower extremity edema; No deformity. Neuro:  Nonfocal  Psych: Normal affect     Labs    High Sensitivity Troponin:   Recent Labs  Lab 09/07/20 2008 09/07/20 2208 09/08/20 1119  TROPONINIHS 163* 252* 225*      Chemistry Recent Labs  Lab 09/07/20 2008 09/08/20 1119 09/09/20 0613  NA 138 138 138  K 4.2 4.5 3.6  CL 102 104 107  CO2 22 26 23   GLUCOSE 88 106* 107*  BUN 22 18 21   CREATININE 1.76* 1.40* 1.25*  CALCIUM 10.3 9.4 9.1  PROT 8.7*  --   --   ALBUMIN 4.8  --   --   AST 30  --   --   ALT 16  --   --   ALKPHOS 59  --   --   BILITOT 0.6  --   --   GFRNONAA 30* 39* 45*  ANIONGAP 14 8 8      Hematology Recent Labs  Lab 09/07/20 2008 09/09/20 0613  WBC 11.7* 7.1  RBC 4.78 4.12  HGB 13.5 11.7*  HCT 41.4 35.4*  MCV 86.6 85.9  MCH 28.2 28.4  MCHC 32.6 33.1  RDW 15.3 15.5  PLT 401* 385    BNPNo results for input(s): BNP, PROBNP in the last 168 hours.   DDimer No results for input(s):  DDIMER in the last 168 hours.   Radiology    CT Head Wo Contrast  Result Date: 09/07/2020 CLINICAL DATA:  Weakness EXAM: CT HEAD WITHOUT CONTRAST TECHNIQUE: Contiguous axial images were obtained from the base of the skull through the vertex without intravenous contrast. COMPARISON:  None. FINDINGS: Brain: Mild atrophic changes and chronic white matter ischemic changes are seen. No findings to suggest acute hemorrhage, acute infarction or space-occupying mass lesion are noted. Vascular: No hyperdense vessel or unexpected calcification. Skull: Normal. Negative for fracture or focal lesion. Sinuses/Orbits: No acute finding. Other: None. IMPRESSION: Chronic atrophic and ischemic changes without acute abnormality. Electronically Signed   By: Alcide Clever M.D.   On: 09/07/2020 21:04   DG Chest Portable 1 View  Result Date: 09/07/2020 CLINICAL DATA:  Weakness, inability to ambulate, dimension EXAM: PORTABLE CHEST 1 VIEW COMPARISON:  None. FINDINGS: The heart size and mediastinal contours are within normal limits. Both lungs are clear. The visualized skeletal  structures are unremarkable. IMPRESSION: No active disease. Electronically Signed   By: Sharlet Salina M.D.   On: 09/07/2020 22:23   ECHOCARDIOGRAM COMPLETE  Result Date: 09/08/2020    ECHOCARDIOGRAM REPORT   Patient Name:   Yolanda Moore Date of Exam: 09/08/2020 Medical Rec #:  [id number]   Height:       60.0 in Accession #:    [id number]  Weight:       164.9 lb Date of Birth:  1945-11-08  BSA:          1.720 m Patient Age:    74 years    BP:           136/67 mmHg Patient Gender: F           HR:           59 bpm. Exam Location:  [facility] Procedure: 2D Echo, Color Doppler, Cardiac Doppler and Intracardiac            Opacification Agent Indications:     I21.4 NSTEMI  History:         Patient has no prior history of Echocardiogram examinations.                  Risk Factors:Hypertension. Dementia.  Sonographer:     Humphrey Rolls RDCS (AE) Referring Phys:  [id number] CHRISTOPHER END Diagnosing Phys: Cristal Deer End MD  Sonographer Comments: Suboptimal apical window and suboptimal subcostal window. IMPRESSIONS  1. 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 Left ventricular ejection fraction, by estimation, is 65 to 70%. The left ventricle has normal function. The left ventricle has no regional wall motion abnormalities. Left ventricular diastolic parameters are consistent with Grade II diastolic dysfunction (pseudonormalization).  2. Right ventricular systolic function is normal. The right ventricular size is normal. Mildly increased right ventricular wall thickness.  3. The mitral valve is normal in structure. Mild mitral valve regurgitation. No evidence of mitral stenosis.  4. The aortic valve was not well visualized. Aortic valve regurgitation is not visualized. No aortic stenosis is present.  5. The inferior vena cava is normal in size with greater than 50% respiratory variability, suggesting right atrial pressure of  3 mmHg. FINDINGS  Left Ventricle: There is complete obliteration of the apical portion of the left ventricular cavity, which can be seen with apical variant of hypertrophic cardiomyopathy. Left ventricular ejection fraction, by estimation, is 65 to 70%. The left ventricle  has normal function. The left ventricle has no regional wall motion abnormalities. Definity contrast agent was given IV to delineate the left ventricular endocardial borders. The left ventricular internal cavity size was normal in size. There is borderline left ventricular hypertrophy. Left ventricular diastolic parameters are consistent with Grade II diastolic dysfunction (pseudonormalization). Right Ventricle: The right ventricular size is normal. Mildly increased right ventricular wall thickness. Right ventricular systolic function is normal. Left Atrium: Left atrial size was normal in size. Right Atrium: Right atrial size was not well visualized. Pericardium: The pericardium was not well visualized. Presence of pericardial fat pad. Mitral Valve: The mitral valve is normal in structure. Mild mitral valve regurgitation. No evidence of mitral valve stenosis. MV peak gradient, 1.4 mmHg. The mean mitral valve gradient is 1.0 mmHg. Tricuspid Valve: The tricuspid valve is not well visualized. Tricuspid valve regurgitation is not demonstrated. Aortic Valve: The aortic valve was not well visualized. Aortic valve regurgitation is not visualized. No aortic stenosis is present. Aortic valve mean gradient measures 2.0 mmHg. Aortic valve peak gradient measures 4.5 mmHg. Aortic valve area, by VTI measures 2.76 cm. Pulmonic Valve: The pulmonic valve was not well visualized. Pulmonic valve regurgitation is not visualized. No evidence of pulmonic stenosis. Aorta: The aortic root is normal in size and structure. Pulmonary Artery: The pulmonary artery is not well seen. Venous: The inferior vena cava is normal in size with greater than 50% respiratory variability,  suggesting right atrial pressure of 3 mmHg. IAS/Shunts: The interatrial septum was not well visualized.  LEFT VENTRICLE PLAX 2D LVIDd:         4.07 cm  Diastology LVIDs:         2.87 cm  LV e' medial:    6.31 cm/s LV PW:         1.03 cm  LV E/e' medial:  8.9 LV IVS:        0.70 cm  LV e' lateral:   7.94 cm/s LVOT diam:     1.80 cm  LV E/e' lateral: 7.1 LV SV:         55 LV SV Index:   32 LVOT Area:     2.54 cm  LEFT ATRIUM             Index LA diam:        3.00 cm 1.74 cm/m LA Vol (A2C):   43.7 ml 25.41 ml/m LA Vol (A4C):   35.0 ml 20.35 ml/m LA Biplane Vol: 40.1 ml 23.32 ml/m  AORTIC VALVE                   PULMONIC VALVE AV Area (Vmax):    2.42 cm    PV Vmax:       0.75 m/s AV Area (Vmean):   2.40 cm    PV Vmean:      53.200 cm/s AV Area (VTI):     2.76 cm    PV VTI:        0.161 m AV Vmax:           106.00 cm/s PV Peak grad:  2.2 mmHg AV Vmean:          66.700 cm/s PV Mean grad:  1.0 mmHg AV VTI:            0.199 m AV Peak Grad:      4.5 mmHg AV Mean Grad:      2.0 mmHg LVOT Vmax:         101.00  cm/s LVOT Vmean:        62.800 cm/s LVOT VTI:          0.216 m LVOT/AV VTI ratio: 1.09  AORTA Ao Root diam: 3.00 cm MITRAL VALVE MV Area (PHT): 3.42 cm    SHUNTS MV Peak grad:  1.4 mmHg    Systemic VTI:  0.22 m MV Mean grad:  1.0 mmHg    Systemic Diam: 1.80 cm MV Vmax:       0.59 m/s MV Vmean:      32.3 cm/s MV Decel Time: 222 msec MV E velocity: 56.30 cm/s MV A velocity: 40.30 cm/s MV E/A ratio:  1.40 Yvonne Kendall MD Electronically signed by Yvonne Kendall MD Signature Date/Time: 09/08/2020/1:25:09 PM    Final     Cardiac Studies   Echo 09/08/2020  1. 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 Left ventricular ejection fraction, by estimation, is 65  to 70%. The left ventricle has normal  function. The left ventricle has no regional wall motion abnormalities.  Left ventricular diastolic  parameters are consistent with Grade II  diastolic dysfunction (pseudonormalization).   2. Right ventricular systolic function is normal. The right ventricular  size is normal. Mildly increased right ventricular wall thickness.   3. The mitral valve is normal in structure. Mild mitral valve  regurgitation. No evidence of mitral stenosis.   4. The aortic valve was not well visualized. Aortic valve regurgitation  is not visualized. No aortic stenosis is present.   5. The inferior vena cava is normal in size with greater than 50%  respiratory variability, suggesting right atrial pressure of 3 mmHg    Patient Profile     74 y.o. female with history of hypertension, CKD stage III, dementia, and seen today for evaluation of weakness and elevated HS Tn.  Assessment & Plan    Weakness, elevated HS troponin --No chest pain or shortness of breath with consideration of her dementia.  She was brought to the hospital by her daughters due to weakness.  Elevated troponin nonspecific and thought to be a manifestation of myocardial ischemia.  Also considered a supply demand mismatch ischemia in the setting of other systemic process.  --HS Tn peak 252.  --EKG shows biphasic T waves in the anterolateral and inferior leads. --TTE as above shows EF 65-70%, apical variant of HCM, NRWMA, G2DD. --Discussed MPI with patient today with consideration of her dementia.  We will plan to also speak with her daughters to explain the risks and benefits of the study. Further recommendations per MD and s/p discussion with daughters. --Continue IV heparin for at least 48 hours (start time 11/1 22:30 by [facility]). --Continue ASA, carvedilol, and atorvastatin. PRN SL nitro for CP if needed.  Essential HTN --BP well controlled at 119/61. --Continue current Coreg.   HLD --Total cholesterol 207, LDL 112. --Continue current atorvastatin. --Repeat outpatient ALT/LFTs in 6 to 8 weeks to reassess.  Hypokalemia --K  3.6. --Replete with goal 4.0.  AOCKD --Renal function improved. --Cr 1.25, BUN 21. --Baseline Cr 1.1-1.3. --Avoid nephrotoxins. --Daily BMET.  Anemia --Hgb 11.7, hematocrit 35.4. --Suspect anemia of chronic disease. --Continue to monitor for s/sx of bleeding on heparin. --Daily CBC.  For questions or updates, please contact [facility] Please consult [email or web address] for contact info under        Signed, Lennon Alstrom, PA-C  09/09/2020, 7:40 AM

## 2020-09-09 NOTE — Plan of Care (Signed)
  Problem: Education: Goal: Understanding of cardiac disease, CV risk reduction, and recovery process will improve Outcome: Progressing Goal: Understanding of medication regimen will improve Outcome: Progressing Goal: Individualized Educational Video(s) Outcome: Progressing   Problem: Activity: Goal: Ability to tolerate increased activity will improve Outcome: Progressing   Problem: Cardiac: Goal: Ability to achieve and maintain adequate cardiopulmonary perfusion will improve Outcome: Progressing   

## 2020-09-09 NOTE — Progress Notes (Signed)
OT Cancellation Note  Patient Details Name: Yolanda Moore MRN: [id number] DOB: 08-23-1946   Cancelled Treatment:    Reason Eval/Treat Not Completed: Patient at procedure or test/ unavailable. OT order received and chart reviewed. Transport arriving when therapist arrived to room to take pt for stress test. OT will follow up when pt is able to participate.   Jackquline Denmark, MS, OTR/L , CBIS ascom [phone]  09/09/20, 9:46 AM   09/09/2020, 9:45 AM

## 2020-09-09 NOTE — Progress Notes (Signed)
PT Cancellation Note  Patient Details Name: ELKE HOLTRY MRN: [id number] DOB: 1946/07/24   Cancelled Treatment:    Reason Eval/Treat Not Completed: Patient at procedure or test/unavailable, will attempt to see pt at a future date/time as medically appropriate.     Ovidio Hanger PT, DPT 09/09/20, 11:56 AM

## 2020-09-09 NOTE — Progress Notes (Signed)
Mobility Specialist - Progress Note   09/09/20 1600  Mobility  Activity Ambulated in room  Level of Assistance Standby assist, set-up cues, supervision of patient - no hands on  Assistive Device Four wheel walker  Distance Ambulated (ft) 30 ft  Mobility Response Tolerated well  Mobility performed by Mobility specialist  $Mobility charge 1 Mobility    Pre-mobility: 58 HR, 99% SpO2 Post-mobility: 66 HR, 99% SpO2   Pt was sitting in recliner upon arrival. Pt agreed to session. Pt would respond to all questions with "I don't know, you'll have to ask my daughter." Pt denied any pain, nausea, or fatigue. Pt was SBA in all transfers this date, including ambulation. Pt ambulated 30' in room with RW. No LOB noted, no heavy breathing. Pt denied SOB. Overall, pt tolerated session well. Pt was left in recliner with all needs in reach and alarm set.    Yolanda Moore Mobility Specialist 09/09/20, 4:23 PM

## 2020-09-09 NOTE — TOC Transition Note (Addendum)
Transition of Care [facility]) - CM/SW Discharge Note   Patient Details  Name: Yolanda Moore MRN: [id number] Date of Birth: 1946-08-29  [facility] CM/SW Contact:  Eilleen Kempf, LCSW Phone Number: 09/09/2020, 4:02 PM   Clinical Narrative:    CSW received consult for patient being recommended for HH/PT. CSW spoke with the patient about home care agencies and patient does not have a preference. CSW spoke with Rosey Bath with [facility] and they will provide HH/PT for this patient when discharged. CSW has not set up any DME yet.          Patient Goals and CMS Choice        Discharge Placement                       Discharge Plan and Services                                     Social Determinants of Health (SDOH) Interventions     Readmission Risk Interventions No flowsheet data found.

## 2020-09-09 NOTE — Care Management Obs Status (Signed)
MEDICARE OBSERVATION STATUS NOTIFICATION   Patient Details  Name: Yolanda Moore MRN: [id number] Date of Birth: [birth year]   Medicare Observation Status Notification Given:   yes    Eilleen Kempf, LCSW 09/09/2020, 8:50 AM

## 2020-09-10 DIAGNOSIS — F0390 Unspecified dementia without behavioral disturbance: Secondary | ICD-10-CM | POA: Diagnosis not present

## 2020-09-10 DIAGNOSIS — R531 Weakness: Secondary | ICD-10-CM | POA: Diagnosis not present

## 2020-09-10 DIAGNOSIS — R778 Other specified abnormalities of plasma proteins: Secondary | ICD-10-CM | POA: Diagnosis not present

## 2020-09-10 LAB — BASIC METABOLIC PANEL
Anion gap: 9 (ref 5–15)
BUN: 19 mg/dL (ref 8–23)
CO2: 25 mmol/L (ref 22–32)
Calcium: 9 mg/dL (ref 8.9–10.3)
Chloride: 105 mmol/L (ref 98–111)
Creatinine, Ser: 1.3 mg/dL — ABNORMAL HIGH (ref 0.44–1.00)
GFR, Estimated: 43 mL/min — ABNORMAL LOW (ref >60)
Glucose, Bld: 113 mg/dL — ABNORMAL HIGH (ref 70–99)
Potassium: 3.9 mmol/L (ref 3.5–5.1)
Sodium: 139 mmol/L (ref 135–145)

## 2020-09-10 LAB — CBC
HCT: 35.8 % — ABNORMAL LOW (ref 36.0–46.0)
Hemoglobin: 11.4 g/dL — ABNORMAL LOW (ref 12.0–15.0)
MCH: 28.5 pg (ref 26.0–34.0)
MCHC: 31.8 g/dL (ref 30.0–36.0)
MCV: 89.5 fL (ref 80.0–100.0)
Platelets: 363 10*3/uL (ref 150–400)
RBC: 4 MIL/uL (ref 3.87–5.11)
RDW: 15.6 % — ABNORMAL HIGH (ref 11.5–15.5)
WBC: 6.1 10*3/uL (ref 4.0–10.5)
nRBC: 0 % (ref 0.0–0.2)

## 2020-09-10 MED ORDER — ASPIRIN 81 MG PO TBEC: 81.0000 mg | DELAYED_RELEASE_TABLET | Freq: Every day | ORAL | 0 refills | Status: AC

## 2020-09-10 MED ORDER — CARVEDILOL 6.25 MG PO TABS: 6.2500 mg | ORAL_TABLET | Freq: Two times a day (BID) | ORAL | 0 refills | Status: DC

## 2020-09-10 MED ORDER — ATORVASTATIN CALCIUM 40 MG PO TABS: 40.0000 mg | ORAL_TABLET | Freq: Every day | ORAL | 0 refills | Status: AC

## 2020-09-10 NOTE — Hospital Course (Signed)
74 year old female with history of CKD, dementia, hypertension presenting with weakness, found to have elevated troponins.  Patient denies any symptoms of chest pain or shortness of breath.  Echocardiogram showed normal systolic function with no wall motion abnormalities.  Stress test This is a low risk study. The left ventricular ejection fraction is hyperdynamic (>65%). There is no evidence for ischemia

## 2020-09-10 NOTE — Progress Notes (Addendum)
Progress Note  Patient Name: Yolanda Moore Date of Encounter: 09/10/2020  [facility] Cardiologist: [facility], Dr. Okey Dupre   Subjective   She denies any chest pain or shortness of breath this morning.    Discussed NM findings.  Addendum: Daughter now in room. Will discuss findings with daughter.  Inpatient Medications    Scheduled Meds: . aspirin EC  81 mg Oral Daily  . atorvastatin  40 mg Oral Daily  . carvedilol  6.25 mg Oral BID WC  . donepezil  10 mg Oral QHS   Continuous Infusions: . sodium chloride Stopped (09/08/20 1623)   PRN Meds: acetaminophen, nitroGLYCERIN, ondansetron (ZOFRAN) IV   Vital Signs    Vitals:   09/10/20 0359 09/10/20 0816 09/10/20 1001 09/10/20 1154  BP: (!) 111/45 (!) 116/49  132/61  Pulse: (!) 54 (!) 56 60 60  Resp: 17 18  18   Temp: 98.4 F (36.9 C) 98.8 F (37.1 C)  98.3 F (36.8 C)  TempSrc: Oral Oral  Oral  SpO2: 100% 100%  100%  Weight: 72.7 kg     Height:        Intake/Output Summary (Last 24 hours) at 09/10/2020 1338 Last data filed at 09/10/2020 1303 Gross per 24 hour  Intake 120 ml  Output 350 ml  Net -230 ml   Last 3 Weights 09/10/2020 09/09/2020 09/08/2020  Weight (lbs) 160 lb 4.8 oz 160 lb 7.9 oz 164 lb 14.5 oz  Weight (kg) 72.712 kg 72.8 kg 74.8 kg      Telemetry    NSR-SB- Personally Reviewed  ECG    No new tracings- Personally Reviewed  Physical Exam   GEN: No acute distress.  Lying in bed.  Neck: No JVD Cardiac: RRR, no murmurs, rubs, or gallops.  Respiratory: Clear to auscultation bilaterally. GI: Soft, nontender, non-distended  MS: Trace nonpitting lower extremity edema; No deformity. Neuro:  Nonfocal  Psych: Normal affect   Labs    High Sensitivity Troponin:   Recent Labs  Lab 09/07/20 2008 09/07/20 2208 09/08/20 1119  TROPONINIHS 163* 252* 225*      Chemistry Recent Labs  Lab 09/07/20 2008 09/07/20 2008 09/08/20 1119 09/09/20 0613 09/10/20 0604  NA 138   < > 138 138 139  K 4.2   <  > 4.5 3.6 3.9  CL 102   < > 104 107 105  CO2 22   < > 26 23 25   GLUCOSE 88   < > 106* 107* 113*  BUN 22   < > 18 21 19   CREATININE 1.76*   < > 1.40* 1.25* 1.30*  CALCIUM 10.3   < > 9.4 9.1 9.0  PROT 8.7*  --   --   --   --   ALBUMIN 4.8  --   --   --   --   AST 30  --   --   --   --   ALT 16  --   --   --   --   ALKPHOS 59  --   --   --   --   BILITOT 0.6  --   --   --   --   GFRNONAA 30*   < > 39* 45* 43*  ANIONGAP 14   < > 8 8 9    < > = values in this interval not displayed.     Hematology Recent Labs  Lab 09/07/20 2008 09/09/20 0613 09/10/20 0604  WBC 11.7* 7.1 6.1  RBC 4.78 4.12 4.00  HGB 13.5 11.7* 11.4*  HCT 41.4 35.4* 35.8*  MCV 86.6 85.9 89.5  MCH 28.2 28.4 28.5  MCHC 32.6 33.1 31.8  RDW 15.3 15.5 15.6*  PLT 401* 385 363    BNPNo results for input(s): BNP, PROBNP in the last 168 hours.   DDimer No results for input(s): DDIMER in the last 168 hours.   Radiology    NM Myocar Multi W/Spect W/Wall Motion / EF  Result Date: 09/09/2020  T wave inversion was noted during stress in the I, II, III, aVF, V3, V4, V5, V6 leads, unchanged from rest.  Lateral ST segment depression was noted at rest and stress.  The study is normal.  This is a low risk study.  The left ventricular ejection fraction is hyperdynamic (>65%).  There is no evidence for ischemia     Cardiac Studies   Echo 09/08/2020  1. 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 Left ventricular ejection fraction, by estimation, is 65  to 70%. The left ventricle has normal  function. The left ventricle has no regional wall motion abnormalities.  Left ventricular diastolic parameters are consistent with Grade II  diastolic dysfunction (pseudonormalization).   2. Right ventricular systolic function is normal. The right ventricular  size is normal. Mildly increased right ventricular wall  thickness.   3. The mitral valve is normal in structure. Mild mitral valve  regurgitation. No evidence of mitral stenosis.   4. The aortic valve was not well visualized. Aortic valve regurgitation  is not visualized. No aortic stenosis is present.   5. The inferior vena cava is normal in size with greater than 50%  respiratory variability, suggesting right atrial pressure of 3 mmHg    MPI 09/09/20  T wave inversion was noted during stress in the I, II, III, aVF, V3, V4, V5, V6 leads, unchanged from rest.  Lateral ST segment depression was noted at rest and stress.  The study is normal.  This is a low risk study.  The left ventricular ejection fraction is hyperdynamic (>65%).  There is no evidence for ischemia    Patient Profile     74 y.o. female with history of hypertension, CKD stage III, dementia, and seen today for evaluation of weakness and elevated HS Tn.  Assessment & Plan    Weakness, elevated HS troponin --No chest pain or shortness of breath with consideration of her dementia.   --HS Tn peak 252.  --EKG shows biphasic T waves in the anterolateral and inferior leads. --TTE as above shows EF 65-70%, apical variant of HCM, NRWMA, G2DD. --Completed 48 hours of IV heparin. --MPI performed 11/3 and ruled low risk as above. --Given her low risk stress test, she can likely be discharged home and follow-up in the outpatient setting. Continue ASA, carvedilol, and atorvastatin. PRN SL nitro for CP if needed.  Essential HTN --BP well controlled. --Continue current Coreg.   HLD --Total cholesterol 207, LDL 112. --Continue current atorvastatin. --Repeat outpatient ALT/LFTs in 6 to 8 weeks to reassess.  Hypokalemia --K 3.9 and improved. --Replete with goal 4.0.  AOCKD --Renal function bumped overnight but still wnl. --Cr 1.25  1.30, BUN 21  25. --Baseline Cr 1.1-1.3. --Daily BMET.  Anemia --H&H stable. --Suspect anemia of chronic disease. --Daily CBC.  For  questions or updates, please contact [facility] Please consult [email or web address] for contact info under  Signed, Lennon Alstrom, PA-C  09/10/2020, 1:38 PM

## 2020-09-10 NOTE — Progress Notes (Signed)
IV and tele removed/discharge instructions explained to pt's daughter, [name] Poteat/ verbalized understanding. Will transport off unit via wheelchair.

## 2020-09-10 NOTE — Progress Notes (Signed)
Mobility Specialist - Progress Note   09/10/20 1300  Mobility  Activity Ambulated in room  Range of Motion/Exercises Right leg;Left leg (straight leg raises, ankle pumps)  Level of Assistance Standby assist, set-up cues, supervision of patient - no hands on  Assistive Device Front wheel walker  Distance Ambulated (ft) 60 ft  Mobility Response Tolerated well  Mobility performed by Mobility specialist  $Mobility charge 1 Mobility    Pre-mobility: 57 HR, 98% SpO2 Post-mobility: 64 HR, 100% SpO2   Pt was sitting in recliner upon arrival with sister present. Pt agreed to session. Pt denied any pain, nausea, or fatigue. Pt was able to stand to RW and ambulate in the room with supervision. No LOB or heavy breathing noted. Pt denied SOB and weakness. Upon return to recliner, pt performed seated exercises: ankle pumps and straight leg raises. Overall, pt tolerated session well. Pt was left in recliner with all needs in reach and alarm set.    Filiberto Pinks Mobility Specialist 09/10/20, 1:25 PM

## 2020-09-10 NOTE — Discharge Summary (Addendum)
Physician Discharge Summary  Yolanda Moore HDQ:[id number] DOB: [birth year] DOA: 09/07/2020  PCP: Marisue Ivan, MD  Admit date: 09/07/2020 Discharge date: 09/10/2020  Admitted From: home Disposition: home w/ home health   Recommendations for Outpatient Follow-up:  1. Follow up with PCP in 1 week 2. F/u cardio in 2 weeks  Home Health: yes Equipment/Devices:  Discharge Condition: stable  CODE STATUS: full  Diet recommendation: Heart Healthy  Brief/Interim Summary: HPI was taken from Dr. Para March: Yolanda Moore Yolanda Moore is a 74 y.o. female with medical history significant for hypertension and dementia, was brought to the emergency room with a complaint of weakness and diaphoresis that started at around 1500 in the afternoon.  She denied chest pain, nausea or vomiting.  History is limited due to dementia.  She was previously in her usual state of health and had no recent illness, no cough or shortness of breath.  She had no one-sided weakness of the face or extremities. ED Course: On arrival, vitals were within normal limits.  Blood work significant for troponin of 163.  EKG as interpreted by me shows T wave inversion in the lateral leads including D2-D3 and F as well as V4 V5 V6 Other blood work unremarkable.  Patient given chewable aspirin was started on a heparin infusion.  Hospitalist consulted for admission  Hospital Course from Dr. Shela Commons. Mayford Knife 11/3-11/4/21: Pt presented w/ generalized weakness and was found to have elevated troponins. The elevated troponins were secondary to demand ischemia. MPI was done and showed low risk as per cardio. Pt will f/u outpatient w/ cardio in 2 weeks. Also, PT/OT saw the pt and recommended home health. Home health was set up by CM prior to d/c.   Discharge Diagnoses:  Principal Problem:   NSTEMI (non-ST elevated myocardial infarction) (HCC) Active Problems:   Essential hypertension   Chronic kidney disease, stage 3b (HCC)   Dementia without behavioral  disturbance (HCC)   Weakness   Acute kidney injury superimposed on CKD (HCC)   Elevated troponin  Elevated troponins: secondary to demand ischemia as per cardio. NSTEMI r/o. Continue on aspirin, carvedilol, statin. Nitro prn. MPI shows low risk as per cardio . Continue on tele. Cardio following and recs apprec  Generalized weakness: CT head neg for any acute CVA. PT/OT recs home health   HTN: will continue on home dose of carvedilol.  CKDIIIb: Cr is labile. Will continue to monitor   Alzheimer disease: continue on home dose of donepezil   Discharge Instructions  Discharge Instructions    Diet - low sodium heart healthy   Complete by: As directed    Discharge instructions   Complete by: As directed    F/u PCP in 1 week. F/u cardio in 2 weeks   Increase activity slowly   Complete by: As directed      Allergies as of 09/10/2020   No Known Allergies     Medication List    STOP taking these medications   hydrochlorothiazide 25 MG tablet Commonly known as: HYDRODIURIL     TAKE these medications   aspirin 81 MG EC tablet Take 1 tablet (81 mg total) by mouth daily. Swallow whole. Start taking on: September 11, 2020   atorvastatin 40 MG tablet Commonly known as: LIPITOR Take 1 tablet (40 mg total) by mouth daily. Start taking on: September 11, 2020   carvedilol 6.25 MG tablet Commonly known as: COREG Take 1 tablet (6.25 mg total) by mouth 2 (two) times daily with a meal.  donepezil 10 MG tablet Commonly known as: ARICEPT Take 10 mg by mouth at bedtime.   losartan 100 MG tablet Commonly known as: COZAAR Take 100 mg by mouth daily.       No Known Allergies  Consultations:  Cardio    Procedures/Studies: CT Head Wo Contrast  Result Date: 09/07/2020 CLINICAL DATA:  Weakness EXAM: CT HEAD WITHOUT CONTRAST TECHNIQUE: Contiguous axial images were obtained from the base of the skull through the vertex without intravenous contrast. COMPARISON:  None. FINDINGS:  Brain: Mild atrophic changes and chronic white matter ischemic changes are seen. No findings to suggest acute hemorrhage, acute infarction or space-occupying mass lesion are noted. Vascular: No hyperdense vessel or unexpected calcification. Skull: Normal. Negative for fracture or focal lesion. Sinuses/Orbits: No acute finding. Other: None. IMPRESSION: Chronic atrophic and ischemic changes without acute abnormality. Electronically Signed   By: Alcide Clever M.D.   On: 09/07/2020 21:04   NM Myocar Multi W/Spect W/Wall Motion / EF  Result Date: 09/09/2020  T wave inversion was noted during stress in the I, II, III, aVF, V3, V4, V5, V6 leads, unchanged from rest.  Lateral ST segment depression was noted at rest and stress.  The study is normal.  This is a low risk study.  The left ventricular ejection fraction is hyperdynamic (>65%).  There is no evidence for ischemia    DG Chest Portable 1 View  Result Date: 09/07/2020 CLINICAL DATA:  Weakness, inability to ambulate, dimension EXAM: PORTABLE CHEST 1 VIEW COMPARISON:  None. FINDINGS: The heart size and mediastinal contours are within normal limits. Both lungs are clear. The visualized skeletal structures are unremarkable. IMPRESSION: No active disease. Electronically Signed   By: Sharlet Salina M.D.   On: 09/07/2020 22:23   ECHOCARDIOGRAM COMPLETE  Result Date: 09/08/2020    ECHOCARDIOGRAM REPORT   Patient Name:   Yolanda Moore Date of Exam: 09/08/2020 Medical Rec #:  [id number]   Height:       60.0 in Accession #:    [id number]  Weight:       164.9 lb Date of Birth:  27-Jan-1946  BSA:          1.720 m Patient Age:    74 years    BP:           136/67 mmHg Patient Gender: F           HR:           59 bpm. Exam Location:  [facility] Procedure: 2D Echo, Color Doppler, Cardiac Doppler and Intracardiac            Opacification Agent Indications:     I21.4 NSTEMI  History:         Patient has no prior history of Echocardiogram examinations.                  Risk  Factors:Hypertension. Dementia.  Sonographer:     Humphrey Rolls RDCS (AE) Referring Phys:  [id number] CHRISTOPHER END Diagnosing Phys: Cristal Deer End MD  Sonographer Comments: Suboptimal apical window and suboptimal subcostal window. IMPRESSIONS  1. 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 Left ventricular ejection fraction, by estimation, is 65 to 70%. The left ventricle has normal function. The left ventricle has no regional wall motion abnormalities. Left ventricular diastolic parameters are consistent with Grade II diastolic dysfunction (pseudonormalization).  2. Right ventricular systolic function is normal. The  right ventricular size is normal. Mildly increased right ventricular wall thickness.  3. The mitral valve is normal in structure. Mild mitral valve regurgitation. No evidence of mitral stenosis.  4. The aortic valve was not well visualized. Aortic valve regurgitation is not visualized. No aortic stenosis is present.  5. The inferior vena cava is normal in size with greater than 50% respiratory variability, suggesting right atrial pressure of 3 mmHg. FINDINGS  Left Ventricle: There is complete obliteration of the apical portion of the left ventricular cavity, which can be seen with apical variant of hypertrophic cardiomyopathy. Left ventricular ejection fraction, by estimation, is 65 to 70%. The left ventricle has normal function. The left ventricle has no regional wall motion abnormalities. Definity contrast agent was given IV to delineate the left ventricular endocardial borders. The left ventricular internal cavity size was normal in size. There is borderline left ventricular hypertrophy. Left ventricular diastolic parameters are consistent with Grade II diastolic dysfunction (pseudonormalization). Right Ventricle: The right ventricular size is normal. Mildly increased right ventricular wall  thickness. Right ventricular systolic function is normal. Left Atrium: Left atrial size was normal in size. Right Atrium: Right atrial size was not well visualized. Pericardium: The pericardium was not well visualized. Presence of pericardial fat pad. Mitral Valve: The mitral valve is normal in structure. Mild mitral valve regurgitation. No evidence of mitral valve stenosis. MV peak gradient, 1.4 mmHg. The mean mitral valve gradient is 1.0 mmHg. Tricuspid Valve: The tricuspid valve is not well visualized. Tricuspid valve regurgitation is not demonstrated. Aortic Valve: The aortic valve was not well visualized. Aortic valve regurgitation is not visualized. No aortic stenosis is present. Aortic valve mean gradient measures 2.0 mmHg. Aortic valve peak gradient measures 4.5 mmHg. Aortic valve area, by VTI measures 2.76 cm. Pulmonic Valve: The pulmonic valve was not well visualized. Pulmonic valve regurgitation is not visualized. No evidence of pulmonic stenosis. Aorta: The aortic root is normal in size and structure. Pulmonary Artery: The pulmonary artery is not well seen. Venous: The inferior vena cava is normal in size with greater than 50% respiratory variability, suggesting right atrial pressure of 3 mmHg. IAS/Shunts: The interatrial septum was not well visualized.  LEFT VENTRICLE PLAX 2D LVIDd:         4.07 cm  Diastology LVIDs:         2.87 cm  LV e' medial:    6.31 cm/s LV PW:         1.03 cm  LV E/e' medial:  8.9 LV IVS:        0.70 cm  LV e' lateral:   7.94 cm/s LVOT diam:     1.80 cm  LV E/e' lateral: 7.1 LV SV:         55 LV SV Index:   32 LVOT Area:     2.54 cm  LEFT ATRIUM             Index LA diam:        3.00 cm 1.74 cm/m LA Vol (A2C):   43.7 ml 25.41 ml/m LA Vol (A4C):   35.0 ml 20.35 ml/m LA Biplane Vol: 40.1 ml 23.32 ml/m  AORTIC VALVE                   PULMONIC VALVE AV Area (Vmax):    2.42 cm    PV Vmax:       0.75 m/s AV Area (Vmean):   2.40 cm    PV Vmean:  53.200 cm/s AV Area (VTI):      2.76 cm    PV VTI:        0.161 m AV Vmax:           106.00 cm/s PV Peak grad:  2.2 mmHg AV Vmean:          66.700 cm/s PV Mean grad:  1.0 mmHg AV VTI:            0.199 m AV Peak Grad:      4.5 mmHg AV Mean Grad:      2.0 mmHg LVOT Vmax:         101.00 cm/s LVOT Vmean:        62.800 cm/s LVOT VTI:          0.216 m LVOT/AV VTI ratio: 1.09  AORTA Ao Root diam: 3.00 cm MITRAL VALVE MV Area (PHT): 3.42 cm    SHUNTS MV Peak grad:  1.4 mmHg    Systemic VTI:  0.22 m MV Mean grad:  1.0 mmHg    Systemic Diam: 1.80 cm MV Vmax:       0.59 m/s MV Vmean:      32.3 cm/s MV Decel Time: 222 msec MV E velocity: 56.30 cm/s MV A velocity: 40.30 cm/s MV E/A ratio:  1.40 Cristal Deer End MD Electronically signed by Yvonne Kendall MD Signature Date/Time: 09/08/2020/1:25:09 PM    Final      Subjective: pt c/o fatigue    Discharge Exam: Vitals:   09/10/20 1001 09/10/20 1154  BP:  132/61  Pulse: 60 60  Resp:  18  Temp:  98.3 F (36.8 C)  SpO2:  100%   Vitals:   09/10/20 0359 09/10/20 0816 09/10/20 1001 09/10/20 1154  BP: (!) 111/45 (!) 116/49  132/61  Pulse: (!) 54 (!) 56 60 60  Resp: Temp: 98.4 F (36.9 C) 98.8 F (37.1 C)  98.3 F (36.8 C)  TempSrc: Oral Oral  Oral  SpO2: 100% 100%  100%  Weight: 72.7 kg     Height:        General: Pt is alert, awake, not in acute distress Cardiovascular:  S1/S2 +, no rubs, no gallops Respiratory: CTA bilaterally, no wheezing, no rhonchi Abdominal: Soft, NT, ND, bowel sounds + Extremities:no cyanosis    The results of significant diagnostics from this hospitalization (including imaging, microbiology, ancillary and laboratory) are listed below for reference.     Microbiology: Recent Results (from the past 240 hour(s))  Respiratory Panel by RT PCR (Flu A&B, Covid) - Nasopharyngeal Swab     Status: None   Collection Time: 09/07/20 11:49 PM   Specimen: Nasopharyngeal Swab  Result Value Ref Range Status   SARS Coronavirus 2 by RT PCR NEGATIVE  NEGATIVE Final    Comment: (NOTE) SARS-CoV-2 target nucleic acids are NOT DETECTED.  The SARS-CoV-2 RNA is generally detectable in upper respiratoy specimens during the acute phase of infection. The lowest concentration of SARS-CoV-2 viral copies this assay can detect is 131 copies/mL. A negative result does not preclude SARS-Cov-2 infection and should not be used as the sole basis for treatment or other patient management decisions. A negative result may occur with  improper specimen collection/handling, submission of specimen other than nasopharyngeal swab, presence of viral mutation(s) within the areas targeted by this assay, and inadequate number of viral copies (<131 copies/mL). A negative result must be combined with clinical observations, patient history, and epidemiological information. The expected result is Negative.  Fact Sheet for Patients:  https://www.moore.com/  Fact Sheet for Healthcare Providers:  https://www.young.biz/  This test is no t yet approved or cleared by the [place] FDA and  has been authorized for detection and/or diagnosis of SARS-CoV-2 by FDA under an Emergency Use Authorization (EUA). This EUA will remain  in effect (meaning this test can be used) for the duration of the COVID-19 declaration under Section 564(b)(1) of the Act, 21 U.S.C. section 360bbb-3(b)(1), unless the authorization is terminated or revoked sooner.     Influenza A by PCR NEGATIVE NEGATIVE Final   Influenza B by PCR NEGATIVE NEGATIVE Final    Comment: (NOTE) The Xpert Xpress SARS-CoV-2/FLU/RSV assay is intended as an aid in  the diagnosis of influenza from Nasopharyngeal swab specimens and  should not be used as a sole basis for treatment. Nasal washings and  aspirates are unacceptable for Xpert Xpress SARS-CoV-2/FLU/RSV  testing.  Fact Sheet for Patients: https://www.moore.com/  Fact Sheet for Healthcare  Providers: https://www.young.biz/  This test is not yet approved or cleared by the [place] FDA and  has been authorized for detection and/or diagnosis of SARS-CoV-2 by  FDA under an Emergency Use Authorization (EUA). This EUA will remain  in effect (meaning this test can be used) for the duration of the  Covid-19 declaration under Section 564(b)(1) of the Act, 21  U.S.C. section 360bbb-3(b)(1), unless the authorization is  terminated or revoked. Performed at [facility], [address] [place], [place] [address]      Labs: BNP (last 3 results) No results for input(s): BNP in the last 8760 hours. Basic Metabolic Panel: Recent Labs  Lab 09/07/20 2008 09/08/20 1119 09/09/20 0613 09/10/20 0604  NA 138 138 138 139  K 4.2 4.5 3.6 3.9  CL 102 104 107 105  CO2 22 26 23 25   GLUCOSE 88 106* 107* 113*  BUN 22 18 21 19   CREATININE 1.76* 1.40* 1.25* 1.30*  CALCIUM 10.3 9.4 9.1 9.0   Liver Function Tests: Recent Labs  Lab 09/07/20 2008  AST 30  ALT 16  ALKPHOS 59  BILITOT 0.6  PROT 8.7*  ALBUMIN 4.8   No results for input(s): LIPASE, AMYLASE in the last 168 hours. No results for input(s): AMMONIA in the last 168 hours. CBC: Recent Labs  Lab 09/07/20 2008 09/09/20 0613 09/10/20 0604  WBC 11.7* 7.1 6.1  HGB 13.5 11.7* 11.4*  HCT 41.4 35.4* 35.8*  MCV 86.6 85.9 89.5  PLT 401* 385 363   Cardiac Enzymes: No results for input(s): CKTOTAL, CKMB, CKMBINDEX, TROPONINI in the last 168 hours. BNP: Invalid input(s): POCBNP CBG: No results for input(s): GLUCAP in the last 168 hours. D-Dimer No results for input(s): DDIMER in the last 72 hours. Hgb A1c No results for input(s): HGBA1C in the last 72 hours. Lipid Profile Recent Labs    09/09/20 0613  CHOL 207*  HDL 79  LDLCALC 112*  TRIG 80  CHOLHDL 2.6   Thyroid function studies No results for input(s): TSH, T4TOTAL, T3FREE, THYROIDAB in the last 72 hours.  Invalid input(s):  FREET3 Anemia work up No results for input(s): VITAMINB12, FOLATE, FERRITIN, TIBC, IRON, RETICCTPCT in the last 72 hours. Urinalysis    Component Value Date/Time   COLORURINE YELLOW (A) 09/07/2020 2008   APPEARANCEUR HAZY (A) 09/07/2020 2008   LABSPEC 1.021 09/07/2020 2008   PHURINE 5.0 09/07/2020 2008   GLUCOSEU NEGATIVE 09/07/2020 2008   HGBUR NEGATIVE 09/07/2020 2008   BILIRUBINUR NEGATIVE 09/07/2020 2008  KETONESUR NEGATIVE 09/07/2020 2008   PROTEINUR NEGATIVE 09/07/2020 2008   NITRITE NEGATIVE 09/07/2020 2008   LEUKOCYTESUR SMALL (A) 09/07/2020 2008   Sepsis Labs Invalid input(s): PROCALCITONIN,  WBC,  LACTICIDVEN Microbiology Recent Results (from the past 240 hour(s))  Respiratory Panel by RT PCR (Flu A&B, Covid) - Nasopharyngeal Swab     Status: None   Collection Time: 09/07/20 11:49 PM   Specimen: Nasopharyngeal Swab  Result Value Ref Range Status   SARS Coronavirus 2 by RT PCR NEGATIVE NEGATIVE Final    Comment: (NOTE) SARS-CoV-2 target nucleic acids are NOT DETECTED.  The SARS-CoV-2 RNA is generally detectable in upper respiratoy specimens during the acute phase of infection. The lowest concentration of SARS-CoV-2 viral copies this assay can detect is 131 copies/mL. A negative result does not preclude SARS-Cov-2 infection and should not be used as the sole basis for treatment or other patient management decisions. A negative result may occur with  improper specimen collection/handling, submission of specimen other than nasopharyngeal swab, presence of viral mutation(s) within the areas targeted by this assay, and inadequate number of viral copies (<131 copies/mL). A negative result must be combined with clinical observations, patient history, and epidemiological information. The expected result is Negative.  Fact Sheet for Patients:  https://www.moore.com/  Fact Sheet for Healthcare Providers:   https://www.young.biz/  This test is no t yet approved or cleared by the [place] FDA and  has been authorized for detection and/or diagnosis of SARS-CoV-2 by FDA under an Emergency Use Authorization (EUA). This EUA will remain  in effect (meaning this test can be used) for the duration of the COVID-19 declaration under Section 564(b)(1) of the Act, 21 U.S.C. section 360bbb-3(b)(1), unless the authorization is terminated or revoked sooner.     Influenza A by PCR NEGATIVE NEGATIVE Final   Influenza B by PCR NEGATIVE NEGATIVE Final    Comment: (NOTE) The Xpert Xpress SARS-CoV-2/FLU/RSV assay is intended as an aid in  the diagnosis of influenza from Nasopharyngeal swab specimens and  should not be used as a sole basis for treatment. Nasal washings and  aspirates are unacceptable for Xpert Xpress SARS-CoV-2/FLU/RSV  testing.  Fact Sheet for Patients: https://www.moore.com/  Fact Sheet for Healthcare Providers: https://www.young.biz/  This test is not yet approved or cleared by the [place] FDA and  has been authorized for detection and/or diagnosis of SARS-CoV-2 by  FDA under an Emergency Use Authorization (EUA). This EUA will remain  in effect (meaning this test can be used) for the duration of the  Covid-19 declaration under Section 564(b)(1) of the Act, 21  U.S.C. section 360bbb-3(b)(1), unless the authorization is  terminated or revoked. Performed at [facility], [address], [place], [place] [address]      Time coordinating discharge: Over 30 minutes  SIGNED:   Charise Killian, MD  [facility] 09/10/2020, 1:54 PM Pager   If 7PM-7AM, please contact night-coverage [email or web address]

## 2020-09-10 NOTE — Evaluation (Signed)
Occupational Therapy Evaluation Patient Details Name: Yolanda Moore MRN: [id number] DOB: 24-Apr-1946 Today's Date: 09/10/2020    History of Present Illness Pt is a 74 y.o. female with medical history significant for hypertension, CKD, and dementia brought to the emergency room with a complaint of weakness and diaphoresis. MD assessment includes: Elevated troponins, acute kidney injury on chronic kidney disease stage IIIa, and weakness.   Clinical Impression   Patient presenting with decreased I in self care, balance, functional mobility/transfers, endurance, and safety awareness.  Patient lives at independent living facility [facility] and use of RW for mobility. Pt reports not needing assistance with self care prior to admission. Patient currently functioning at supervision - min guard for self care tasks . Patient will benefit from acute OT to increase overall independence in the areas of ADLs, functional mobility, and safety awareness in order to safely discharge home.    Follow Up Recommendations  Home health OT;Supervision - Intermittent    Equipment Recommendations  None recommended by OT       Precautions / Restrictions Precautions Precautions: Fall      Mobility Bed Mobility Overal bed mobility: Independent      General bed mobility comments: from flat bed    Transfers Overall transfer level: Needs assistance Equipment used: None Transfers: Sit to/from Stand Sit to Stand: Supervision         General transfer comment: close supervision. Pt reporting she uses RW in community but not in her home.    Balance Overall balance assessment: Needs assistance   Sitting balance-Leahy Scale: Normal Sitting balance - Comments: no LOB   Standing balance support: Bilateral upper extremity supported;During functional activity Standing balance-Leahy Scale: Good          ADL either performed or assessed with clinical judgement   ADL Overall ADL's : Needs  assistance/impaired Eating/Feeding: Modified independent   Grooming: Wash/dry hands;Wash/dry face;Oral care;Sitting;Set up;Supervision/safety       General ADL Comments: Pt needing total A to don hospital socks this session. Supervision/set up A for self care tasks while seated on EOB     Vision Baseline Vision/History: Wears glasses Wears Glasses: At all times Patient Visual Report: No change from baseline Vision Assessment?: No apparent visual deficits            Pertinent Vitals/Pain Pain Assessment: No/denies pain     Hand Dominance Right   Extremity/Trunk Assessment Upper Extremity Assessment Upper Extremity Assessment: Generalized weakness   Lower Extremity Assessment Lower Extremity Assessment: Generalized weakness       Communication Communication Communication: No difficulties   Cognition Arousal/Alertness: Awake/alert Behavior During Therapy: WFL for tasks assessed/performed Overall Cognitive Status: History of cognitive impairments - at baseline                   Home Living Family/patient expects to be discharged to:: Private residence Living Arrangements: Alone Available Help at Discharge: Family;Available PRN/intermittently Type of Home: Independent living facility Home Access: Level entry     Home Layout: One level     Bathroom Shower/Tub: Producer, television/film/video: Standard Bathroom Accessibility: Yes   Home Equipment: Environmental consultant - 2 wheels;Walker - 4 wheels;Cane - single point   Additional Comments: hx obtained from PT eval (daugher- Wilma assisted with PLOF)      Prior Functioning/Environment Level of Independence: Independent with assistive device(s)        Comments: Mod Ind amb limited community distances with a SPC, two falls in the last week secondary  to weakness, daughters assist with ADLs        OT Problem List: Decreased strength;Decreased cognition;Decreased activity tolerance;Decreased safety  awareness;Decreased knowledge of use of DME or AE;Impaired balance (sitting and/or standing);Decreased knowledge of precautions      OT Treatment/Interventions: Self-care/ADL training;Therapeutic exercise;Therapeutic activities;Energy conservation;DME and/or AE instruction;Patient/family education;Balance training    OT Goals(Current goals can be found in the care plan section) Acute Rehab OT Goals Patient Stated Goal: to go back home and feel better OT Goal Formulation: With patient Time For Goal Achievement: 09/24/20 Potential to Achieve Goals: Good ADL Goals Pt Will Perform Grooming: with modified independence;standing Pt Will Perform Lower Body Dressing: with modified independence;sit to/from stand Pt Will Transfer to Toilet: with modified independence;ambulating Pt Will Perform Toileting - Clothing Manipulation and hygiene: with modified independence;sit to/from stand  OT Frequency: Min 2X/week   Barriers to D/C:    none at this time          AM-PAC OT "6 Clicks" Daily Activity     Outcome Measure Help from another person eating meals?: None Help from another person taking care of personal grooming?: A Little Help from another person toileting, which includes using toliet, bedpan, or urinal?: A Little Help from another person bathing (including washing, rinsing, drying)?: A Little Help from another person to put on and taking off regular upper body clothing?: None Help from another person to put on and taking off regular lower body clothing?: A Little 6 Click Score: 20   End of Session Nurse Communication: Mobility status  Activity Tolerance: Patient tolerated treatment well Patient left: in chair;with chair alarm set  OT Visit Diagnosis: Muscle weakness (generalized) (M62.81);History of falling (Z91.81)                Time: 1779-3903 OT Time Calculation (min): 12 min Charges:  OT General Charges $OT Visit: 1 Visit OT Evaluation $OT Eval Low Complexity: 1  Low  Jackquline Denmark, MS, OTR/L , CBIS ascom [phone]  09/10/20, 11:02 AM

## 2020-09-14 ENCOUNTER — Ambulatory Visit: Payer: Medicare PPO | Attending: Internal Medicine

## 2020-09-14 DIAGNOSIS — Z23 Encounter for immunization: Secondary | ICD-10-CM

## 2020-09-14 NOTE — Progress Notes (Signed)
   Covid-19 Vaccination Clinic  Name:  CAPITOLA LADSON    MRN: [id number] DOB: 1946/04/28  09/14/2020  Ms. Doolan was observed post Covid-19 immunization for 15 minutes without incident. She was provided with Vaccine Information Sheet and instruction to access the V-Safe system.   Ms. Ramakrishnan was instructed to call 911 with any severe reactions post vaccine: Marland Kitchen Difficulty breathing  . Swelling of face and throat  . A fast heartbeat  . A bad rash all over body  . Dizziness and weakness

## 2020-09-19 NOTE — Progress Notes (Deleted)
Office Visit    Patient Name: Yolanda Moore Date of Encounter: 09/19/2020  Primary Care Provider:  Marisue Ivan, MD Primary Cardiologist:  Yvonne Kendall, MD  Chief Complaint    No chief complaint on file.      74 y.o. female with history of hypertension, CKD stage III, dementia, recent admission with weakness and elevated HS Tn who presents for follow-up.  Past Medical History    Past Medical History:  Diagnosis Date  . Chronic kidney disease   . Dementia (HCC)   . HTN (hypertension)    Past Surgical History:  Procedure Laterality Date  . ABDOMINAL HYSTERECTOMY      Allergies  No Known Allergies  History of Present Illness    Yolanda Moore is a 74 y.o. female with PMH as above.   She presented to [facility] 09/08/2020 due to weakness with unclear precipitant.  Much of her history was obtained via her daughter.  In the [facility] emergency department, she was found to have elevated troponin.  High-sensitivity troponin peaked at 252.  EKG showed NSR with biphasic T waves in the anterior lateral leads.  Echo showed EF 65 to 70%, apical variant of HCM, and R WMA, G2 DD.  She completed 48 hours of IV heparin.  She was started on a statin for aggressive risk factor modification.  MPI performed and ruled low risk.  Recommendation was for out patient ALT/LFTs in 6 to 8 weeks.   ---Previous A/P--- Weakness, elevated HS troponin --No chest pain or shortness of breath with consideration of her dementia.   --HS Tn peak 252.  --EKG shows biphasic T waves in the anterolateral and inferior leads. --TTE as above shows EF 65-70%, apical variant of HCM, NRWMA, G2DD. --Completed 48 hours of IV heparin. --MPI performed 11/3 and ruled low risk as above. --Given her low risk stress test, she can likely be discharged home and follow-up in the outpatient setting. Continue ASA, carvedilol, and atorvastatin. PRN SL nitro for CP if needed.  Essential HTN --BP well controlled. --Continue current  Coreg.   HLD --Total cholesterol 207, LDL 112. --Continue current atorvastatin. --Repeat outpatient ALT/LFTs in 6 to 8 weeks to reassess.  Hypokalemia --K 3.9 and improved. --Replete with goal 4.0.  AOCKD --Renal function bumped overnight but still wnl. --Cr 1.25  1.30, BUN 21  25. --Baseline Cr 1.1-1.3. --Daily BMET.  Anemia --H&H stable. --Suspect anemia of chronic disease. --Daily CBC.  Home Medications    Current Outpatient Medications on File Prior to Visit  Medication Sig Dispense Refill  . aspirin EC 81 MG EC tablet Take 1 tablet (81 mg total) by mouth daily. Swallow whole. 30 tablet 0  . atorvastatin (LIPITOR) 40 MG tablet Take 1 tablet (40 mg total) by mouth daily. 30 tablet 0  . carvedilol (COREG) 6.25 MG tablet Take 1 tablet (6.25 mg total) by mouth 2 (two) times daily with a meal. 60 tablet 0  . donepezil (ARICEPT) 10 MG tablet Take 10 mg by mouth at bedtime.    Marland Kitchen losartan (COZAAR) 100 MG tablet Take 100 mg by mouth daily.     No current facility-administered medications on file prior to visit.    Review of Systems    ***.   All other systems reviewed and are otherwise negative except as noted above.  Physical Exam    VS:  There were no vitals taken for this visit. , BMI There is no height or weight on file to calculate BMI.  GEN: Well nourished, well developed, in no acute distress. HEENT: normal. Neck: Supple, no JVD, carotid bruits, or masses. Cardiac: RRR, no murmurs, rubs, or gallops. No clubbing, cyanosis, edema.  Radials/DP/PT 2+ and equal bilaterally.  Respiratory:  Respirations regular and unlabored, clear to auscultation bilaterally. GI: Soft, nontender, nondistended, BS + x 4. MS: no deformity or atrophy. Skin: warm and dry, no rash. Neuro:  Strength and sensation are intact. Psych: Normal affect.  Accessory Clinical Findings    ECG personally reviewed by me today - *** - no acute changes.  VITALS Reviewed today   Temp Readings from  Last 3 Encounters:  09/10/20 98.3 F (36.8 C) (Oral)  04/19/20 98.5 F (36.9 C)   BP Readings from Last 3 Encounters:  09/10/20 132/61  04/19/20 (!) 147/66   Pulse Readings from Last 3 Encounters:  09/10/20 60  04/19/20 67    Wt Readings from Last 3 Encounters:  09/10/20 160 lb 4.8 oz (72.7 kg)  04/19/20 173 lb (78.5 kg)     LABS  reviewed today   Lab Results  Component Value Date   WBC 6.1 09/10/2020   HGB 11.4 (L) 09/10/2020   HCT 35.8 (L) 09/10/2020   MCV 89.5 09/10/2020   PLT 363 09/10/2020   Lab Results  Component Value Date   CREATININE 1.30 (H) 09/10/2020   BUN 19 09/10/2020   NA 139 09/10/2020   K 3.9 09/10/2020   CL 105 09/10/2020   CO2 25 09/10/2020   Lab Results  Component Value Date   ALT 16 09/07/2020   AST 30 09/07/2020   ALKPHOS 59 09/07/2020   BILITOT 0.6 09/07/2020   Lab Results  Component Value Date   CHOL 207 (H) 09/09/2020   HDL 79 09/09/2020   LDLCALC 112 (H) 09/09/2020   TRIG 80 09/09/2020   CHOLHDL 2.6 09/09/2020    No results found for: HGBA1C No results found for: TSH   STUDIES/PROCEDURES reviewed today   *** Echo 09/08/2020 1. 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 Left ventricular ejection fraction, by estimation, is 65  to 70%. The left ventricle has normal  function. The left ventricle has no regional wall motion abnormalities.  Left ventricular diastolic parameters are consistent with Grade II  diastolic dysfunction (pseudonormalization).  2. Right ventricular systolic function is normal. The right ventricular  size is normal. Mildly increased right ventricular wall thickness.  3. The mitral valve is normal in structure. Mild mitral valve  regurgitation. No evidence of mitral stenosis.  4. The aortic valve was not well visualized. Aortic valve regurgitation  is not visualized. No aortic stenosis  is present.  5. The inferior vena cava is normal in size with greater than 50%  respiratory variability, suggesting right atrial pressure of 3 mmHg   MPI 09/09/20  T wave inversion was noted during stress in the I, II, III, aVF, V3, V4, V5, V6 leads, unchanged from rest.  Lateral ST segment depression was noted at rest and stress.  The study is normal.  This is a low risk study.  The left ventricular ejection fraction is hyperdynamic (>65%).  There is no evidence for ischemia     Assessment & Plan    ***  Medication changes: *** Labs ordered: *** Studies / Imaging ordered: *** Future considerations: *** Disposition: ***  Total time spent with patient today *** minutes. This includes reviewing records,  evaluating the patient, and coordinating care. Face-to-face time >50%.    Lennon Alstrom, PA-C 09/19/2020

## 2020-09-24 ENCOUNTER — Ambulatory Visit: Payer: Medicare PPO | Admitting: Physician Assistant

## 2021-02-02 ENCOUNTER — Other Ambulatory Visit: Payer: Self-pay

## 2021-02-02 ENCOUNTER — Encounter: Payer: Self-pay | Admitting: Ophthalmology

## 2021-02-04 ENCOUNTER — Other Ambulatory Visit
Admission: RE | Admit: 2021-02-04 | Discharge: 2021-02-04 | Disposition: A | Discharge status: Home / Self Care | Payer: Medicare PPO | Source: Ambulatory Visit | Attending: Ophthalmology | Admitting: Ophthalmology

## 2021-02-04 ENCOUNTER — Other Ambulatory Visit: Payer: Self-pay

## 2021-02-04 DIAGNOSIS — Z01812 Encounter for preprocedural laboratory examination: Secondary | ICD-10-CM | POA: Insufficient documentation

## 2021-02-04 DIAGNOSIS — Z20822 Contact with and (suspected) exposure to covid-19: Secondary | ICD-10-CM | POA: Diagnosis not present

## 2021-02-04 LAB — SARS CORONAVIRUS 2 (TAT 6-24 HRS): SARS Coronavirus 2: NEGATIVE

## 2021-02-04 NOTE — Discharge Instructions (Signed)

## 2021-02-08 ENCOUNTER — Other Ambulatory Visit: Payer: Self-pay

## 2021-02-08 ENCOUNTER — Encounter
Admission: RE | Disposition: A | Discharge status: Home / Self Care | Payer: Self-pay | Source: Home / Self Care | Attending: Ophthalmology

## 2021-02-08 ENCOUNTER — Ambulatory Visit
Admission: RE | Admit: 2021-02-08 | Discharge: 2021-02-08 | Disposition: A | Discharge status: Home / Self Care | Payer: Medicare PPO | Attending: Ophthalmology | Admitting: Ophthalmology

## 2021-02-08 ENCOUNTER — Encounter: Payer: Self-pay | Admitting: Ophthalmology

## 2021-02-08 ENCOUNTER — Ambulatory Visit: Payer: Medicare PPO | Admitting: Anesthesiology

## 2021-02-08 DIAGNOSIS — Z79899 Other long term (current) drug therapy: Secondary | ICD-10-CM | POA: Insufficient documentation

## 2021-02-08 DIAGNOSIS — Z809 Family history of malignant neoplasm, unspecified: Secondary | ICD-10-CM | POA: Diagnosis not present

## 2021-02-08 DIAGNOSIS — I252 Old myocardial infarction: Secondary | ICD-10-CM | POA: Diagnosis not present

## 2021-02-08 DIAGNOSIS — Z87891 Personal history of nicotine dependence: Secondary | ICD-10-CM | POA: Insufficient documentation

## 2021-02-08 DIAGNOSIS — H2511 Age-related nuclear cataract, right eye: Secondary | ICD-10-CM | POA: Diagnosis present

## 2021-02-08 DIAGNOSIS — I10 Essential (primary) hypertension: Secondary | ICD-10-CM | POA: Diagnosis not present

## 2021-02-08 DIAGNOSIS — Z8041 Family history of malignant neoplasm of ovary: Secondary | ICD-10-CM | POA: Diagnosis not present

## 2021-02-08 HISTORY — PX: CATARACT EXTRACTION W/PHACO: SHX586

## 2021-02-08 HISTORY — DX: Presence of dental prosthetic device (complete) (partial): Z97.2

## 2021-02-08 SURGERY — PHACOEMULSIFICATION, CATARACT, WITH IOL INSERTION
Anesthesia: Monitor Anesthesia Care | Site: Eye | Laterality: Right

## 2021-02-08 MED ORDER — TETRACAINE HCL 0.5 % OP SOLN
1.0000 [drp] | OPHTHALMIC | Status: DC | PRN
  Administered 2021-02-08 (×3): 1 [drp] via OPHTHALMIC

## 2021-02-08 MED ORDER — SODIUM HYALURONATE 10 MG/ML IO SOLN
INTRAOCULAR | Status: DC | PRN
  Administered 2021-02-08: 0.55 mL via INTRAOCULAR

## 2021-02-08 MED ORDER — LIDOCAINE HCL (PF) 2 % IJ SOLN
INTRAOCULAR | Status: DC | PRN
  Administered 2021-02-08: 1 mL via INTRAOCULAR

## 2021-02-08 MED ORDER — EPINEPHRINE PF 1 MG/ML IJ SOLN
INTRAOCULAR | Status: DC | PRN
  Administered 2021-02-08: 109 mL via OPHTHALMIC

## 2021-02-08 MED ORDER — DEXMEDETOMIDINE HCL 200 MCG/2ML IV SOLN
INTRAVENOUS | Status: DC | PRN
  Administered 2021-02-08: 10 ug via INTRAVENOUS

## 2021-02-08 MED ORDER — FENTANYL CITRATE (PF) 100 MCG/2ML IJ SOLN
INTRAMUSCULAR | Status: DC | PRN
  Administered 2021-02-08: 25 ug via INTRAVENOUS

## 2021-02-08 MED ORDER — SODIUM HYALURONATE 23 MG/ML IO SOLN
INTRAOCULAR | Status: DC | PRN
  Administered 2021-02-08: 0.6 mL via INTRAOCULAR

## 2021-02-08 MED ORDER — MOXIFLOXACIN HCL 0.5 % OP SOLN
OPHTHALMIC | Status: DC | PRN
  Administered 2021-02-08: 0.2 mL via OPHTHALMIC

## 2021-02-08 MED ORDER — ARMC OPHTHALMIC DILATING DROPS
1.0000 "application " | OPHTHALMIC | Status: DC | PRN
  Administered 2021-02-08 (×3): 1 via OPHTHALMIC

## 2021-02-08 MED ORDER — TRYPAN BLUE 0.06 % OP SOLN
OPHTHALMIC | Status: DC | PRN
  Administered 2021-02-08: 0.5 mL via INTRAOCULAR

## 2021-02-08 SURGICAL SUPPLY — 17 items
CANNULA ANT/CHMB 27GA (MISCELLANEOUS) ×4 IMPLANT
DISSECTOR HYDRO NUCLEUS 50X22 (MISCELLANEOUS) ×2 IMPLANT
GLOVE PI ULTRA LF STRL 7.5 (GLOVE) ×1 IMPLANT
GLOVE PI ULTRA NON LATEX 7.5 (GLOVE) ×1
GLOVE SURG SYN 8.5  E (GLOVE) ×2
GLOVE SURG SYN 8.5 E (GLOVE) ×1 IMPLANT
GOWN STRL REUS W/ TWL LRG LVL3 (GOWN DISPOSABLE) ×2 IMPLANT
GOWN STRL REUS W/TWL LRG LVL3 (GOWN DISPOSABLE) ×4
LENS IOL TECNIS EYHANCE 16.0 (Intraocular Lens) ×2 IMPLANT
MARKER SKIN DUAL TIP RULER LAB (MISCELLANEOUS) ×2 IMPLANT
PACK DR. KING ARMS (PACKS) ×2 IMPLANT
PACK EYE AFTER SURG (MISCELLANEOUS) ×2 IMPLANT
PACK OPTHALMIC (MISCELLANEOUS) ×2 IMPLANT
SYR 3ML LL SCALE MARK (SYRINGE) ×2 IMPLANT
SYR TB 1ML LUER SLIP (SYRINGE) ×2 IMPLANT
WATER STERILE IRR 250ML POUR (IV SOLUTION) ×2 IMPLANT
WIPE NON LINTING 3.25X3.25 (MISCELLANEOUS) ×2 IMPLANT

## 2021-02-08 NOTE — Anesthesia Postprocedure Evaluation (Signed)
Anesthesia Post Note  Patient: Yolanda Moore  Procedure(s) Performed: CATARACT EXTRACTION PHACO AND INTRAOCULAR LENS PLACEMENT ([place]) RIGHT (Right Eye)     Patient location during evaluation: PACU Anesthesia Type: MAC Level of consciousness: awake and alert Pain management: pain level controlled Vital Signs Assessment: post-procedure vital signs reviewed and stable Respiratory status: spontaneous breathing Cardiovascular status: blood pressure returned to baseline Postop Assessment: no apparent nausea or vomiting, adequate PO intake and no headache Anesthetic complications: no   No complications documented.  Adele Barthel Roxan Yamamoto

## 2021-02-08 NOTE — Op Note (Signed)
OPERATIVE NOTE  Yolanda Moore [id number] 02/08/2021   PREOPERATIVE DIAGNOSIS:  Nuclear sclerotic cataract right eye.  H25.11   POSTOPERATIVE DIAGNOSIS:    Nuclear sclerotic cataract right eye.     PROCEDURE:  Phacoemusification with posterior chamber intraocular lens placement of the right eye   LENS:   Implant Name Type Inv. Item Serial No. Manufacturer Lot No. LRB No. Used Action  LENS IOL TECNIS EYHANCE 16.0 - [id number] Intraocular Lens LENS IOL TECNIS EYHANCE 16.0 [id number] JOHNSON   Right 1 Implanted       Procedure(s) with comments: CATARACT EXTRACTION PHACO AND INTRAOCULAR LENS PLACEMENT ([facility]) RIGHT (Right) - 3.32 0:28.4  DIB00 +16.0   ULTRASOUND TIME: 0 minutes 28 seconds.  CDE 3.32   SURGEON:  Willey Blade, MD, MPH  ANESTHESIOLOGIST: Anesthesiologist: Page, Wille Celeste, MD CRNA: Michaele Offer, CRNA   ANESTHESIA:  Topical with tetracaine drops augmented with 1% preservative-free intracameral lidocaine.  ESTIMATED BLOOD LOSS: less than 1 mL.   COMPLICATIONS:  None.   DESCRIPTION OF PROCEDURE:  The patient was identified in the holding room and transported to the operating room and placed in the supine position under the operating microscope.    The patients head was taped.   The right eye was identified as the operative eye and it was prepped and draped in the usual sterile ophthalmic fashion.   A 1.0 millimeter clear-corneal paracentesis was made at the 10:30 position. 0.5 ml of preservative-free 1% lidocaine with epinephrine was injected into the anterior chamber.  The view was poor, partly from a poor reflex but also a cloudy cornea.  Trypan blue was used in the standard fashion.   The anterior chamber was filled with Healon 5 viscoelastic.  A 2.4 millimeter keratome was used to make a near-clear corneal incision at the 8:00 position.  A curvilinear capsulorrhexis was made with a cystotome and capsulorrhexis forceps.  Balanced salt solution was used to hydrodissect  and hydrodelineate the nucleus.   Phacoemulsification was then used in stop and chop fashion to remove the lens nucleus and epinucleus.  The remaining cortex was then removed using the irrigation and aspiration handpiece. Healon was then placed into the capsular bag to distend it for lens placement.  A lens was then injected into the capsular bag.  The remaining viscoelastic was aspirated.   Wounds were hydrated with balanced salt solution.  The anterior chamber was inflated to a physiologic pressure with balanced salt solution.   Intracameral vigamox 0.1 mL undiluted was injected into the eye and a drop placed onto the ocular surface.  No wound leaks were noted.  The patient was taken to the recovery room in stable condition without complications of anesthesia or surgery  [name] 02/08/2021, 10:48 AM

## 2021-02-08 NOTE — Transfer of Care (Signed)
Immediate Anesthesia Transfer of Care Note  Patient: Yolanda Moore  Procedure(s) Performed: CATARACT EXTRACTION PHACO AND INTRAOCULAR LENS PLACEMENT ([facility]) RIGHT (Right Eye)  Patient Location: PACU  Anesthesia Type: MAC  Level of Consciousness: awake, alert  and patient cooperative  Airway and Oxygen Therapy: Patient Spontanous Breathing and Patient connected to supplemental oxygen  Post-op Assessment: Post-op Vital signs reviewed, Patient's Cardiovascular Status Stable, Respiratory Function Stable, Patent Airway and No signs of Nausea or vomiting  Post-op Vital Signs: Reviewed and stable  Complications: No complications documented.

## 2021-02-08 NOTE — Anesthesia Procedure Notes (Signed)
Procedure Name: MAC Date/Time: 02/08/2021 10:26 AM Performed by: Silvana Newness, CRNA Pre-anesthesia Checklist: Patient identified, Emergency Drugs available, Suction available, Patient being monitored and Timeout performed Patient Re-evaluated:Patient Re-evaluated prior to induction Oxygen Delivery Method: Nasal cannula Placement Confirmation: positive ETCO2

## 2021-02-08 NOTE — Anesthesia Preprocedure Evaluation (Addendum)
Anesthesia Evaluation  Patient identified by MRN, date of birth, ID band Patient confused    History of Anesthesia Complications Negative for: history of anesthetic complications  Airway Mallampati: II  TM Distance: >3 FB Neck ROM: Full    Dental  (+) Edentulous Upper Edentulous upper, only about 3 intact teeth lower:   Pulmonary former smoker,    Pulmonary exam normal        Cardiovascular Exercise Tolerance: Good hypertension, Pt. on medications Normal cardiovascular exam     Neuro/Psych Dementia Alzheimer's dementia, does not know why she is here today. Significant short term memory loss   GI/Hepatic   Endo/Other  negative endocrine ROS  Renal/GU Renal disease (Stage III CKD)     Musculoskeletal   Abdominal   Peds  Hematology negative hematology ROS (+)   Anesthesia Other Findings   Reproductive/Obstetrics                            Anesthesia Physical Anesthesia Plan  ASA: III  Anesthesia Plan: MAC   Post-op Pain Management:    Induction: Intravenous  PONV Risk Score and Plan: 2 and TIVA and Treatment may vary due to age or medical condition  Airway Management Planned: Nasal Cannula and Natural Airway  Additional Equipment: None  Intra-op Plan:   Post-operative Plan:   Informed Consent: I have reviewed the patients History and Physical, chart, labs and discussed the procedure including the risks, benefits and alternatives for the proposed anesthesia with the patient or authorized representative who has indicated his/her understanding and acceptance.       Plan Discussed with: CRNA  Anesthesia Plan Comments:         Anesthesia Quick Evaluation

## 2021-02-08 NOTE — H&P (Signed)
[facility]   Primary Care Physician:  Marisue Ivan, MD Ophthalmologist: Dr. Willey Blade  Pre-Procedure History & Physical: HPI:  Yolanda Moore is a 75 y.o. female here for cataract surgery.   Past Medical History:  Diagnosis Date  . Ankle fracture, right 04/19/2020  . Chronic kidney disease    Was told many yrs ago that 1 kidney doesn't work  . Dementia (HCC)   . HTN (hypertension)   . MI (myocardial infarction) (HCC) 09/07/2020  . Wears dentures    Partial lower    Past Surgical History:  Procedure Laterality Date  . ABDOMINAL HYSTERECTOMY      Prior to Admission medications   Medication Sig Start Date End Date Taking? Authorizing Provider  atorvastatin (LIPITOR) 40 MG tablet Take 1 tablet (40 mg total) by mouth daily. 09/11/20 10/11/20 Yes Charise Killian, MD  carvedilol (COREG) 6.25 MG tablet Take 1 tablet (6.25 mg total) by mouth 2 (two) times daily with a meal. 09/10/20 10/10/20 Yes Charise Killian, MD  donepezil (ARICEPT) 10 MG tablet Take 10 mg by mouth at bedtime. 04/10/20  Yes [provider]  losartan (COZAAR) 100 MG tablet Take 100 mg by mouth daily. 02/14/20  Yes [provider]  nitroGLYCERIN (NITROSTAT) 0.4 MG SL tablet Place 0.4 mg under the tongue every 5 (five) minutes as needed for chest pain.   Yes [provider]    Allergies as of 12/11/2020  . (No Known Allergies)    Family History  Problem Relation Age of Onset  . Cancer Mother   . Cancer Father   . Ovarian cancer Daughter     Social History   Socioeconomic History  . Marital status: Married    Spouse name: Not on file  . Number of children: Not on file  . Years of education: Not on file  . Highest education level: Not on file  Occupational History  . Not on file  Tobacco Use  . Smoking status: Former Smoker    Quit date: 1990    Years since quitting: 32.2  . Smokeless tobacco: Never Used  Vaping Use  . Vaping Use: Never used  Substance and  Sexual Activity  . Alcohol use: Not Currently  . Drug use: Never  . Sexual activity: Not on file  Other Topics Concern  . Not on file  Social History Narrative  . Not on file   Social Determinants of Health   Financial Resource Strain: Not on file  Food Insecurity: Not on file  Transportation Needs: Not on file  Physical Activity: Not on file  Stress: Not on file  Social Connections: Not on file  Intimate Partner Violence: Not on file    Review of Systems: See HPI, otherwise negative ROS  Physical Exam: BP (!) 156/70   Pulse 60   Temp (!) 97.1 F (36.2 C) (Temporal)   Ht 4\' 11"  (1.499 m)   Wt 74.8 kg   SpO2 98%   BMI 33.33 kg/m  General:   Alert,  pleasant and cooperative in NAD Head:  Normocephalic and atraumatic. Respiratory:  Normal work of breathing. Cardiovascular:  RRR  Impression/Plan: Yolanda Moore is here for cataract surgery.  Risks, benefits, limitations, and alternatives regarding cataract surgery have been reviewed with the patient.  Questions have been answered.  All parties agreeable.   Janine Ores, MD  02/08/2021, 10:11 AM

## 2021-02-09 ENCOUNTER — Encounter: Payer: Self-pay | Admitting: Ophthalmology

## 2021-02-11 ENCOUNTER — Encounter: Payer: Self-pay | Admitting: Ophthalmology

## 2021-02-11 ENCOUNTER — Other Ambulatory Visit: Payer: Self-pay

## 2021-02-18 NOTE — Discharge Instructions (Signed)

## 2021-02-22 ENCOUNTER — Encounter
Admission: RE | Disposition: A | Discharge status: Home / Self Care | Payer: Self-pay | Source: Home / Self Care | Attending: Ophthalmology

## 2021-02-22 ENCOUNTER — Ambulatory Visit: Payer: Medicare PPO | Admitting: Anesthesiology

## 2021-02-22 ENCOUNTER — Other Ambulatory Visit: Payer: Self-pay

## 2021-02-22 ENCOUNTER — Ambulatory Visit
Admission: RE | Admit: 2021-02-22 | Discharge: 2021-02-22 | Disposition: A | Discharge status: Home / Self Care | Payer: Medicare PPO | Attending: Ophthalmology | Admitting: Ophthalmology

## 2021-02-22 ENCOUNTER — Encounter: Payer: Self-pay | Admitting: Ophthalmology

## 2021-02-22 DIAGNOSIS — Z87891 Personal history of nicotine dependence: Secondary | ICD-10-CM | POA: Diagnosis not present

## 2021-02-22 DIAGNOSIS — N189 Chronic kidney disease, unspecified: Secondary | ICD-10-CM | POA: Insufficient documentation

## 2021-02-22 DIAGNOSIS — H2512 Age-related nuclear cataract, left eye: Secondary | ICD-10-CM | POA: Insufficient documentation

## 2021-02-22 DIAGNOSIS — Z79899 Other long term (current) drug therapy: Secondary | ICD-10-CM | POA: Insufficient documentation

## 2021-02-22 DIAGNOSIS — I129 Hypertensive chronic kidney disease with stage 1 through stage 4 chronic kidney disease, or unspecified chronic kidney disease: Secondary | ICD-10-CM | POA: Diagnosis not present

## 2021-02-22 HISTORY — PX: CATARACT EXTRACTION W/PHACO: SHX586

## 2021-02-22 SURGERY — PHACOEMULSIFICATION, CATARACT, WITH IOL INSERTION
Anesthesia: Monitor Anesthesia Care | Site: Eye | Laterality: Left

## 2021-02-22 MED ORDER — FENTANYL CITRATE (PF) 100 MCG/2ML IJ SOLN
INTRAMUSCULAR | Status: DC | PRN
  Administered 2021-02-22: 25 ug via INTRAVENOUS

## 2021-02-22 MED ORDER — LACTATED RINGERS IV SOLN: INTRAVENOUS | Status: DC

## 2021-02-22 MED ORDER — MOXIFLOXACIN HCL 0.5 % OP SOLN
OPHTHALMIC | Status: DC | PRN
  Administered 2021-02-22: 0.2 mL via OPHTHALMIC

## 2021-02-22 MED ORDER — OXYCODONE HCL 5 MG PO TABS: 5.0000 mg | ORAL_TABLET | Freq: Once | ORAL | Status: DC | PRN

## 2021-02-22 MED ORDER — SODIUM HYALURONATE 10 MG/ML IO SOLN
INTRAOCULAR | Status: DC | PRN
  Administered 2021-02-22: 0.55 mL via INTRAOCULAR

## 2021-02-22 MED ORDER — TETRACAINE HCL 0.5 % OP SOLN
1.0000 [drp] | OPHTHALMIC | Status: DC | PRN
  Administered 2021-02-22 (×3): 1 [drp] via OPHTHALMIC

## 2021-02-22 MED ORDER — OXYCODONE HCL 5 MG/5ML PO SOLN
5.0000 mg | Freq: Once | ORAL | Status: DC | PRN
Start: 2021-02-22 — End: 2021-02-22

## 2021-02-22 MED ORDER — EPINEPHRINE PF 1 MG/ML IJ SOLN
INTRAOCULAR | Status: DC | PRN
  Administered 2021-02-22: 72 mL via OPHTHALMIC

## 2021-02-22 MED ORDER — DEXMEDETOMIDINE HCL 200 MCG/2ML IV SOLN
INTRAVENOUS | Status: DC | PRN
  Administered 2021-02-22: 10 ug via INTRAVENOUS

## 2021-02-22 MED ORDER — LIDOCAINE HCL (PF) 2 % IJ SOLN
INTRAOCULAR | Status: DC | PRN
  Administered 2021-02-22: 1 mL via INTRAOCULAR

## 2021-02-22 MED ORDER — ARMC OPHTHALMIC DILATING DROPS
1.0000 "application " | OPHTHALMIC | Status: DC | PRN
  Administered 2021-02-22 (×3): 1 via OPHTHALMIC

## 2021-02-22 MED ORDER — SODIUM HYALURONATE 23 MG/ML IO SOLN
INTRAOCULAR | Status: DC | PRN
  Administered 2021-02-22: 0.6 mL via INTRAOCULAR

## 2021-02-22 SURGICAL SUPPLY — 17 items
CANNULA ANT/CHMB 27GA (MISCELLANEOUS) ×4 IMPLANT
DISSECTOR HYDRO NUCLEUS 50X22 (MISCELLANEOUS) ×2 IMPLANT
GLOVE PI ULTRA LF STRL 7.5 (GLOVE) ×1 IMPLANT
GLOVE PI ULTRA NON LATEX 7.5 (GLOVE) ×1
GLOVE SURG SYN 8.5  E (GLOVE) ×4
GLOVE SURG SYN 8.5 E (GLOVE) ×2 IMPLANT
GOWN STRL REUS W/ TWL LRG LVL3 (GOWN DISPOSABLE) ×2 IMPLANT
GOWN STRL REUS W/TWL LRG LVL3 (GOWN DISPOSABLE) ×4
LENS IOL TECNIS EYHANCE 17.5 (Intraocular Lens) ×2 IMPLANT
MARKER SKIN DUAL TIP RULER LAB (MISCELLANEOUS) ×2 IMPLANT
PACK DR. KING ARMS (PACKS) ×2 IMPLANT
PACK EYE AFTER SURG (MISCELLANEOUS) ×2 IMPLANT
PACK OPTHALMIC (MISCELLANEOUS) ×2 IMPLANT
SYR 3ML LL SCALE MARK (SYRINGE) ×2 IMPLANT
SYR TB 1ML LUER SLIP (SYRINGE) ×2 IMPLANT
WATER STERILE IRR 250ML POUR (IV SOLUTION) ×2 IMPLANT
WIPE NON LINTING 3.25X3.25 (MISCELLANEOUS) ×2 IMPLANT

## 2021-02-22 NOTE — Transfer of Care (Signed)
Immediate Anesthesia Transfer of Care Note  Patient: Yolanda Moore  Procedure(s) Performed: CATARACT EXTRACTION PHACO AND INTRAOCULAR LENS PLACEMENT ([place]) LEFT (Left Eye)  Patient Location: PACU  Anesthesia Type: MAC  Level of Consciousness: awake, alert  and patient cooperative  Airway and Oxygen Therapy: Patient Spontanous Breathing and Patient connected to supplemental oxygen  Post-op Assessment: Post-op Vital signs reviewed, Patient's Cardiovascular Status Stable, Respiratory Function Stable, Patent Airway and No signs of Nausea or vomiting  Post-op Vital Signs: Reviewed and stable  Complications: No complications documented.

## 2021-02-22 NOTE — Anesthesia Postprocedure Evaluation (Signed)
Anesthesia Post Note  Patient: Yolanda Moore  Procedure(s) Performed: CATARACT EXTRACTION PHACO AND INTRAOCULAR LENS PLACEMENT ([place]) LEFT (Left Eye)     Patient location during evaluation: PACU Anesthesia Type: MAC Level of consciousness: awake and alert Pain management: pain level controlled Vital Signs Assessment: post-procedure vital signs reviewed and stable Respiratory status: spontaneous breathing, nonlabored ventilation, respiratory function stable and patient connected to nasal cannula oxygen Cardiovascular status: stable and blood pressure returned to baseline Postop Assessment: no apparent nausea or vomiting Anesthetic complications: no   No complications documented.  Fidel Levy

## 2021-02-22 NOTE — Anesthesia Procedure Notes (Signed)
Procedure Name: MAC Date/Time: 02/22/2021 9:30 AM Performed by: Silvana Newness, CRNA Pre-anesthesia Checklist: Patient identified, Emergency Drugs available, Suction available, Patient being monitored and Timeout performed Patient Re-evaluated:Patient Re-evaluated prior to induction Oxygen Delivery Method: Nasal cannula Placement Confirmation: positive ETCO2

## 2021-02-22 NOTE — Op Note (Signed)
OPERATIVE NOTE  Yolanda Moore [id number] 02/22/2021   PREOPERATIVE DIAGNOSIS:  Nuclear sclerotic cataract left eye.  H25.12   POSTOPERATIVE DIAGNOSIS:    Nuclear sclerotic cataract left eye.     PROCEDURE:  Phacoemusification with posterior chamber intraocular lens placement of the left eye   LENS:   Implant Name Type Inv. Item Serial No. Manufacturer Lot No. LRB No. Used Action  LENS IOL TECNIS EYHANCE 17.5 - [id number] Intraocular Lens LENS IOL TECNIS EYHANCE 17.5 [id number] JOHNSON   Left 1 Implanted      Procedure(s) with comments: CATARACT EXTRACTION PHACO AND INTRAOCULAR LENS PLACEMENT (IOC) LEFT (Left) - 2.17 0:21.2  DIB00 +17.5   ULTRASOUND TIME: 0 minutes 21 seconds.  CDE 2.17   SURGEON:  Willey Blade, MD, MPH   ANESTHESIA:  Topical with tetracaine drops augmented with 1% preservative-free intracameral lidocaine.  ESTIMATED BLOOD LOSS: <1 mL   COMPLICATIONS:  None.   DESCRIPTION OF PROCEDURE:  The patient was identified in the holding room and transported to the operating room and placed in the supine position under the operating microscope.  The left eye was identified as the operative eye and it was prepped and draped in the usual sterile ophthalmic fashion.   A 1.0 millimeter clear-corneal paracentesis was made at the 5:00 position. 0.5 ml of preservative-free 1% lidocaine with epinephrine was injected into the anterior chamber.  The anterior chamber was filled with Healon 5 viscoelastic.  A 2.4 millimeter keratome was used to make a near-clear corneal incision at the 2:00 position.  A curvilinear capsulorrhexis was made with a cystotome and capsulorrhexis forceps.  Balanced salt solution was used to hydrodissect and hydrodelineate the nucleus.   Phacoemulsification was then used in stop and chop fashion to remove the lens nucleus and epinucleus.  The remaining cortex was then removed using the irrigation and aspiration handpiece. Healon was then placed into the  capsular bag to distend it for lens placement.  A lens was then injected into the capsular bag.  The remaining viscoelastic was aspirated.   Wounds were hydrated with balanced salt solution.  The anterior chamber was inflated to a physiologic pressure with balanced salt solution.  Intracameral vigamox 0.1 mL undiltued was injected into the eye and a drop placed onto the ocular surface.  No wound leaks were noted.  The patient was taken to the recovery room in stable condition without complications of anesthesia or surgery  Yolanda Moore 02/22/2021, 9:47 AM

## 2021-02-22 NOTE — Anesthesia Preprocedure Evaluation (Signed)
Anesthesia Evaluation  Patient identified by MRN, date of birth, ID band Patient awake and Patient confused    Reviewed: NPO status   History of Anesthesia Complications Negative for: history of anesthetic complications  Airway Mallampati: II  TM Distance: >3 FB Neck ROM: Full    Dental  (+) Lower Dentures, Missing Edentulous upper, only about 3 intact teeth lower:   Pulmonary neg pulmonary ROS, former smoker,    Pulmonary exam normal        Cardiovascular Exercise Tolerance: Good hypertension, Pt. on medications + Past MI (nov 2021)  Normal cardiovascular exam  echocardiogram 09/08/2020 revealed normal left ventricular function, with LVEF 65 to 70% without significant valvular abnormalities.  Lexiscan Myoview 09/09/2020 did not reveal evidence for scar or ischemia.     Neuro/Psych Dementia Alzheimer's dementia, does not know why she is here today. Significant short term memory lossnegative neurological ROS     GI/Hepatic negative GI ROS, Neg liver ROS,   Endo/Other  Morbid obesity (bmi 34)  Renal/GU CRFRenal disease (Stage III CKD)  negative genitourinary   Musculoskeletal   Abdominal   Peds  Hematology negative hematology ROS (+)   Anesthesia Other Findings Poor historian d/t dementia; hx from daughter.  cards cleared:  [name], MD at 02/01/2021 ;   had E[facility]E 02/08/2021;  Reproductive/Obstetrics                             Anesthesia Physical  Anesthesia Plan  ASA: III  Anesthesia Plan: MAC   Post-op Pain Management:    Induction: Intravenous  PONV Risk Score and Plan: 2 and TIVA and Treatment may vary due to age or medical condition  Airway Management Planned: Nasal Cannula and Natural Airway  Additional Equipment: None  Intra-op Plan:   Post-operative Plan:   Informed Consent: I have reviewed the patients History and Physical, chart, labs and discussed the  procedure including the risks, benefits and alternatives for the proposed anesthesia with the patient or authorized representative who has indicated his/her understanding and acceptance.       Plan Discussed with: CRNA  Anesthesia Plan Comments:         Anesthesia Quick Evaluation

## 2021-02-22 NOTE — H&P (Signed)
[facility]   Primary Care Physician:  Marisue Ivan, MD Ophthalmologist: Dr. Willey Blade  Pre-Procedure History & Physical: HPI:  Yolanda Moore is a 75 y.o. female here for cataract surgery.   Past Medical History:  Diagnosis Date  . Ankle fracture, right 04/19/2020  . Chronic kidney disease    Was told many yrs ago that 1 kidney doesn't work  . Dementia (HCC)   . HTN (hypertension)   . MI (myocardial infarction) (HCC) 09/07/2020  . Wears dentures    Partial lower    Past Surgical History:  Procedure Laterality Date  . ABDOMINAL HYSTERECTOMY    . CATARACT EXTRACTION W/PHACO Right 02/08/2021   Procedure: CATARACT EXTRACTION PHACO AND INTRAOCULAR LENS PLACEMENT (IOC) RIGHT;  Surgeon: Nevada Crane, MD;  Location: [facility];  Service: Ophthalmology;  Laterality: Right;  3.32 0:28.4    Prior to Admission medications   Medication Sig Start Date End Date Taking? Authorizing Provider  atorvastatin (LIPITOR) 40 MG tablet Take 1 tablet (40 mg total) by mouth daily. 09/11/20 10/11/20 Yes Charise Killian, MD  carvedilol (COREG) 6.25 MG tablet Take 1 tablet (6.25 mg total) by mouth 2 (two) times daily with a meal. 09/10/20 10/10/20 Yes Charise Killian, MD  donepezil (ARICEPT) 10 MG tablet Take 10 mg by mouth at bedtime. 04/10/20  Yes [provider]  losartan (COZAAR) 100 MG tablet Take 100 mg by mouth daily. 02/14/20  Yes [provider]  nitroGLYCERIN (NITROSTAT) 0.4 MG SL tablet Place 0.4 mg under the tongue every 5 (five) minutes as needed for chest pain.    [provider]    Allergies as of 12/11/2020  . (No Known Allergies)    Family History  Problem Relation Age of Onset  . Cancer Mother   . Cancer Father   . Ovarian cancer Daughter     Social History   Socioeconomic History  . Marital status: Married    Spouse name: Not on file  . Number of children: Not on file  . Years of education: Not on file  . Highest  education level: Not on file  Occupational History  . Not on file  Tobacco Use  . Smoking status: Former Smoker    Quit date: 1990    Years since quitting: 32.3  . Smokeless tobacco: Never Used  Vaping Use  . Vaping Use: Never used  Substance and Sexual Activity  . Alcohol use: Not Currently  . Drug use: Never  . Sexual activity: Not on file  Other Topics Concern  . Not on file  Social History Narrative  . Not on file   Social Determinants of Health   Financial Resource Strain: Not on file  Food Insecurity: Not on file  Transportation Needs: Not on file  Physical Activity: Not on file  Stress: Not on file  Social Connections: Not on file  Intimate Partner Violence: Not on file    Review of Systems: See HPI, otherwise negative ROS  Physical Exam: BP (!) 146/69   Pulse (!) 58   Temp 97.8 F (36.6 C) (Temporal)   Wt 75.3 kg   SpO2 100%   BMI 33.53 kg/m  General:   Alert,  pleasant and cooperative in NAD Head:  Normocephalic and atraumatic. Respiratory:  Normal work of breathing. Cardiovascular:  RRR  Impression/Plan: Yolanda Moore is here for cataract surgery.  Risks, benefits, limitations, and alternatives regarding cataract surgery have been reviewed with the patient.  Questions have  been answered.  All parties agreeable.   Willey Blade, MD  02/22/2021, 9:18 AM

## 2021-02-23 ENCOUNTER — Encounter: Payer: Self-pay | Admitting: Ophthalmology

## 2022-01-03 ENCOUNTER — Emergency Department
Admission: EM | Admit: 2022-01-03 | Discharge: 2022-01-04 | Disposition: A | Discharge status: Home / Self Care | Payer: Medicare PPO | Attending: Emergency Medicine | Admitting: Emergency Medicine

## 2022-01-03 ENCOUNTER — Other Ambulatory Visit: Payer: Self-pay

## 2022-01-03 ENCOUNTER — Emergency Department: Payer: Medicare PPO

## 2022-01-03 DIAGNOSIS — F0390 Unspecified dementia without behavioral disturbance: Secondary | ICD-10-CM | POA: Diagnosis not present

## 2022-01-03 DIAGNOSIS — I129 Hypertensive chronic kidney disease with stage 1 through stage 4 chronic kidney disease, or unspecified chronic kidney disease: Secondary | ICD-10-CM | POA: Diagnosis not present

## 2022-01-03 DIAGNOSIS — N189 Chronic kidney disease, unspecified: Secondary | ICD-10-CM | POA: Diagnosis not present

## 2022-01-03 DIAGNOSIS — E860 Dehydration: Secondary | ICD-10-CM | POA: Diagnosis not present

## 2022-01-03 DIAGNOSIS — W19XXXA Unspecified fall, initial encounter: Secondary | ICD-10-CM | POA: Diagnosis not present

## 2022-01-03 DIAGNOSIS — N3090 Cystitis, unspecified without hematuria: Secondary | ICD-10-CM

## 2022-01-03 MED ORDER — SODIUM CHLORIDE 0.9 % IV BOLUS
1000.0000 mL | Freq: Once | INTRAVENOUS | Status: AC
  Administered 2022-01-04: 1000 mL via INTRAVENOUS

## 2022-01-03 NOTE — ED Triage Notes (Addendum)
Pt comes via [facility] from [facility] with complaints of a fall. Pt was found laying outside. Pt does not recall falling. Pt has abrasion above r eyebrow but EMS states that was there before. Pt A&O x1, to person only. Hx of dementia.

## 2022-01-04 ENCOUNTER — Emergency Department: Payer: Medicare PPO

## 2022-01-04 LAB — CBC WITH DIFFERENTIAL/PLATELET
Abs Immature Granulocytes: 0.03 10*3/uL (ref 0.00–0.07)
Basophils Absolute: 0 10*3/uL (ref 0.0–0.1)
Basophils Relative: 1 %
Eosinophils Absolute: 0.3 10*3/uL (ref 0.0–0.5)
Eosinophils Relative: 3 %
HCT: 40 % (ref 36.0–46.0)
Hemoglobin: 12.2 g/dL (ref 12.0–15.0)
Immature Granulocytes: 0 %
Lymphocytes Relative: 34 %
Lymphs Abs: 2.8 10*3/uL (ref 0.7–4.0)
MCH: 27.6 pg (ref 26.0–34.0)
MCHC: 30.5 g/dL (ref 30.0–36.0)
MCV: 90.5 fL (ref 80.0–100.0)
Monocytes Absolute: 0.6 10*3/uL (ref 0.1–1.0)
Monocytes Relative: 7 %
Neutro Abs: 4.6 10*3/uL (ref 1.7–7.7)
Neutrophils Relative %: 55 %
Platelets: 335 10*3/uL (ref 150–400)
RBC: 4.42 MIL/uL (ref 3.87–5.11)
RDW: 15.5 % (ref 11.5–15.5)
WBC: 8.3 10*3/uL (ref 4.0–10.5)
nRBC: 0 % (ref 0.0–0.2)

## 2022-01-04 LAB — URINALYSIS, ROUTINE W REFLEX MICROSCOPIC
Bacteria, UA: NONE SEEN
Bilirubin Urine: NEGATIVE
Glucose, UA: NEGATIVE mg/dL
Ketones, ur: NEGATIVE mg/dL
Nitrite: NEGATIVE
Protein, ur: 100 mg/dL — AB
RBC / HPF: >50 RBC/hpf — ABNORMAL HIGH (ref 0–5)
Specific Gravity, Urine: 1.017 (ref 1.005–1.030)
WBC, UA: >50 WBC/hpf — ABNORMAL HIGH (ref 0–5)
pH: 7 (ref 5.0–8.0)

## 2022-01-04 LAB — COMPREHENSIVE METABOLIC PANEL
ALT: 35 U/L (ref 0–44)
AST: 30 U/L (ref 15–41)
Albumin: 3.6 g/dL (ref 3.5–5.0)
Alkaline Phosphatase: 54 U/L (ref 38–126)
Anion gap: 4 — ABNORMAL LOW (ref 5–15)
BUN: 31 mg/dL — ABNORMAL HIGH (ref 8–23)
CO2: 27 mmol/L (ref 22–32)
Calcium: 9.4 mg/dL (ref 8.9–10.3)
Chloride: 116 mmol/L — ABNORMAL HIGH (ref 98–111)
Creatinine, Ser: 1.62 mg/dL — ABNORMAL HIGH (ref 0.44–1.00)
GFR, Estimated: 33 mL/min — ABNORMAL LOW (ref >60)
Glucose, Bld: 101 mg/dL — ABNORMAL HIGH (ref 70–99)
Potassium: 3.7 mmol/L (ref 3.5–5.1)
Sodium: 147 mmol/L — ABNORMAL HIGH (ref 135–145)
Total Bilirubin: 0.5 mg/dL (ref 0.3–1.2)
Total Protein: 6.7 g/dL (ref 6.5–8.1)

## 2022-01-04 MED ORDER — SODIUM CHLORIDE 0.9 % IV SOLN
1.0000 g | Freq: Once | INTRAVENOUS | Status: AC
  Administered 2022-01-04: 1 g via INTRAVENOUS
  Filled 2022-01-04: qty 10

## 2022-01-04 MED ORDER — ONDANSETRON 4 MG PO TBDP
4.0000 mg | ORAL_TABLET | Freq: Three times a day (TID) | ORAL | 0 refills | Status: DC | PRN
Start: 2022-01-04 — End: 2022-05-28

## 2022-01-04 MED ORDER — CEPHALEXIN 500 MG PO CAPS: 500.0000 mg | ORAL_CAPSULE | Freq: Three times a day (TID) | ORAL | 0 refills | Status: DC

## 2022-01-04 NOTE — ED Notes (Signed)
Patient being taken home by POA, [name] Capers (daughter).

## 2022-01-04 NOTE — ED Notes (Signed)
Patient transported to CT 

## 2022-01-04 NOTE — ED Provider Notes (Signed)
[facility] Provider Note    Event Date/Time   First MD Initiated Contact with Patient 01/03/22 2258     (approximate)   History   Fall   HPI  Yolanda Moore is a 76 y.o. female with a history of hypertension, CKD, dementia who comes to the ED due to a fall.  Patient was found outside of her residence on the ground.  Patient does not remember falling, denies any pain or other acute symptoms.  Denies hitting her head or losing consciousness.  Alert to self only at baseline due to history of dementia.  EMS also reports that patient has a previous abrasion over the right forehead which is not new today.     Physical Exam   Triage Vital Signs: ED Triage Vitals  Enc Vitals Group     BP 01/03/22 2257 130/66     Pulse Rate 01/03/22 2257 (!) 58     Resp 01/03/22 2257 17     Temp 01/03/22 2257 98.2 F (36.8 C)     Temp Source 01/03/22 2257 Oral     SpO2 01/03/22 2257 98 %     Weight 01/03/22 2259 153 lb 6.4 oz (69.6 kg)     Height 01/03/22 2259 5' (1.524 m)     Head Circumference --      Peak Flow --      Pain Score 01/03/22 2258 0     Pain Loc --      Pain Edu? --      Excl. in GC? --     Most recent vital signs: Vitals:   01/04/22 0200 01/04/22 0300  BP: 118/90 132/70  Pulse: (!) 52 (!) 52  Resp: 15 17  Temp:    SpO2: 100% 100%     General: Awake, no distress.  Oriented x1 CV:  Good peripheral perfusion.  Regular rate and rhythm Resp:  Normal effort.  Clear to auscultation bilaterally Abd:  No distention.  Soft and nontender Other:  Full range of motion all extremities, no long bone tenderness, no deformities.  No wounds, no soft tissue inflammatory changes.  Moist oral mucosa   ED Results / Procedures / Treatments   Labs (all labs ordered are listed, but only abnormal results are displayed) Labs Reviewed  URINALYSIS, ROUTINE W REFLEX MICROSCOPIC - Abnormal; Notable for the following components:      Result Value   Color, Urine YELLOW  (*)    APPearance CLOUDY (*)    Hgb urine dipstick LARGE (*)    Protein, ur 100 (*)    Leukocytes,Ua SMALL (*)    RBC / HPF >50 (*)    WBC, UA >50 (*)    All other components within normal limits  COMPREHENSIVE METABOLIC PANEL - Abnormal; Notable for the following components:   Sodium 147 (*)    Chloride 116 (*)    Glucose, Bld 101 (*)    BUN 31 (*)    Creatinine, Ser 1.62 (*)    GFR, Estimated 33 (*)    Anion gap 4 (*)    All other components within normal limits  URINE CULTURE  CBC WITH DIFFERENTIAL/PLATELET  CBC WITH DIFFERENTIAL/PLATELET     EKG  Interpreted by me Sinus rhythm rate of 58.  Normal axis, normal QRS.  Normal ST segments.  Diffuse T wave inversions in all leads.  Unchanged from previous EKG on September 11, 2020.   RADIOLOGY Chest x-ray viewed and interpreted by me, appears normal.  Radiology report reviewed.  CT head pending    PROCEDURES:  Critical Care performed: No  Procedures   MEDICATIONS ORDERED IN ED: Medications  sodium chloride 0.9 % bolus 1,000 mL (1,000 mLs Intravenous New Bag/Given 01/04/22 0249)  cefTRIAXone (ROCEPHIN) 1 g in sodium chloride 0.9 % 100 mL IVPB (1 g Intravenous New Bag/Given 01/04/22 0332)     IMPRESSION / MDM / ASSESSMENT AND PLAN / ED COURSE  I reviewed the triage vital signs and the nursing notes.                              Differential diagnosis includes, but is not limited to, intracranial hemorrhage, pneumonia, UTI, dehydration, electrolyte abnormality, mechanical fall    Patient with dementia brought to the ED due to a fall.  She denies any acute symptoms.  No significant findings on exam.  Vital signs are normal.  Due to the limitations of history, evidence of recent head trauma raising possibility of subdural hematoma, will obtain CT scan of the head, labs.  Clinical Course as of 01/04/22 0440  Tue Jan 04, 2022  0106 CT head unremarkable [PS]    Clinical Course User Index [PS] Sharman Cheek, MD     ----------------------------------------- 4:40 AM on 01/04/2022 ----------------------------------------- Serum labs show stable baseline CKD.  There is elevated BUN consistent with degree of dehydration.  Urinalysis reveals UTI.  Patient received IV fluids and Rocephin in the ED.  Condition discussed with 2 family members at bedside who notes that she is little bit more weak and confused than usual, but they feel that she is suitable for outpatient management with available support at home.  I did offer admission but they feel comfortable trying oral antibiotics and following up with primary care.  She is not septic  FINAL CLINICAL IMPRESSION(S) / ED DIAGNOSES   Final diagnoses:  Fall, initial encounter  Chronic dementia (HCC)  Cystitis  Dehydration     Rx / DC Orders   ED Discharge Orders          Ordered    cephALEXin (KEFLEX) 500 MG capsule  3 times daily        01/04/22 0439    ondansetron (ZOFRAN-ODT) 4 MG disintegrating tablet  Every 8 hours PRN        01/04/22 0439             Note:  This document was prepared using Dragon voice recognition software and may include unintentional dictation errors.   Sharman Cheek, MD 01/04/22 [phone]

## 2022-01-05 LAB — URINE CULTURE: Culture: >=100000 — AB

## 2022-03-28 ENCOUNTER — Ambulatory Visit
Admission: RE | Admit: 2022-03-28 | Discharge: 2022-03-28 | Disposition: A | Discharge status: Home / Self Care | Payer: Medicare PPO | Source: Ambulatory Visit | Attending: Family Medicine | Admitting: Family Medicine

## 2022-03-28 ENCOUNTER — Other Ambulatory Visit: Payer: Self-pay | Admitting: Family Medicine

## 2022-03-28 DIAGNOSIS — G3189 Other specified degenerative diseases of nervous system: Secondary | ICD-10-CM | POA: Diagnosis not present

## 2022-03-28 DIAGNOSIS — W19XXXA Unspecified fall, initial encounter: Secondary | ICD-10-CM | POA: Diagnosis not present

## 2022-03-28 DIAGNOSIS — Y92009 Unspecified place in unspecified non-institutional (private) residence as the place of occurrence of the external cause: Secondary | ICD-10-CM | POA: Diagnosis not present

## 2022-03-28 DIAGNOSIS — S0993XA Unspecified injury of face, initial encounter: Secondary | ICD-10-CM

## 2022-05-23 ENCOUNTER — Inpatient Hospital Stay
Admission: EM | Admit: 2022-05-23 | Discharge: 2022-05-28 | DRG: 871 | Disposition: A | Discharge status: Home Health | Payer: Medicare PPO | Attending: Internal Medicine | Admitting: Internal Medicine

## 2022-05-23 ENCOUNTER — Emergency Department: Payer: Medicare PPO

## 2022-05-23 ENCOUNTER — Encounter: Payer: Self-pay | Admitting: Emergency Medicine

## 2022-05-23 ENCOUNTER — Other Ambulatory Visit: Payer: Self-pay

## 2022-05-23 DIAGNOSIS — I2510 Atherosclerotic heart disease of native coronary artery without angina pectoris: Secondary | ICD-10-CM | POA: Diagnosis present

## 2022-05-23 DIAGNOSIS — N189 Chronic kidney disease, unspecified: Secondary | ICD-10-CM

## 2022-05-23 DIAGNOSIS — I129 Hypertensive chronic kidney disease with stage 1 through stage 4 chronic kidney disease, or unspecified chronic kidney disease: Secondary | ICD-10-CM | POA: Diagnosis present

## 2022-05-23 DIAGNOSIS — N1832 Chronic kidney disease, stage 3b: Secondary | ICD-10-CM | POA: Diagnosis present

## 2022-05-23 DIAGNOSIS — N3000 Acute cystitis without hematuria: Secondary | ICD-10-CM

## 2022-05-23 DIAGNOSIS — A419 Sepsis, unspecified organism: Principal | ICD-10-CM

## 2022-05-23 DIAGNOSIS — N179 Acute kidney failure, unspecified: Secondary | ICD-10-CM | POA: Diagnosis present

## 2022-05-23 DIAGNOSIS — R4182 Altered mental status, unspecified: Secondary | ICD-10-CM | POA: Diagnosis present

## 2022-05-23 DIAGNOSIS — I5A Non-ischemic myocardial injury (non-traumatic): Secondary | ICD-10-CM | POA: Diagnosis present

## 2022-05-23 DIAGNOSIS — N390 Urinary tract infection, site not specified: Secondary | ICD-10-CM

## 2022-05-23 DIAGNOSIS — Z87891 Personal history of nicotine dependence: Secondary | ICD-10-CM

## 2022-05-23 DIAGNOSIS — I252 Old myocardial infarction: Secondary | ICD-10-CM | POA: Diagnosis not present

## 2022-05-23 DIAGNOSIS — Z90710 Acquired absence of both cervix and uterus: Secondary | ICD-10-CM | POA: Diagnosis not present

## 2022-05-23 DIAGNOSIS — Z79899 Other long term (current) drug therapy: Secondary | ICD-10-CM

## 2022-05-23 DIAGNOSIS — F0390 Unspecified dementia without behavioral disturbance: Secondary | ICD-10-CM | POA: Diagnosis present

## 2022-05-23 DIAGNOSIS — A4151 Sepsis due to Escherichia coli [E. coli]: Principal | ICD-10-CM | POA: Diagnosis present

## 2022-05-23 DIAGNOSIS — G9341 Metabolic encephalopathy: Secondary | ICD-10-CM | POA: Diagnosis present

## 2022-05-23 DIAGNOSIS — N3001 Acute cystitis with hematuria: Secondary | ICD-10-CM | POA: Diagnosis present

## 2022-05-23 DIAGNOSIS — R6520 Severe sepsis without septic shock: Secondary | ICD-10-CM | POA: Diagnosis present

## 2022-05-23 DIAGNOSIS — R531 Weakness: Secondary | ICD-10-CM | POA: Diagnosis present

## 2022-05-23 DIAGNOSIS — Z972 Presence of dental prosthetic device (complete) (partial): Secondary | ICD-10-CM

## 2022-05-23 DIAGNOSIS — G4700 Insomnia, unspecified: Secondary | ICD-10-CM | POA: Diagnosis present

## 2022-05-23 DIAGNOSIS — E785 Hyperlipidemia, unspecified: Secondary | ICD-10-CM | POA: Diagnosis present

## 2022-05-23 DIAGNOSIS — I10 Essential (primary) hypertension: Secondary | ICD-10-CM | POA: Diagnosis not present

## 2022-05-23 DIAGNOSIS — R778 Other specified abnormalities of plasma proteins: Secondary | ICD-10-CM | POA: Diagnosis present

## 2022-05-23 LAB — URINALYSIS, ROUTINE W REFLEX MICROSCOPIC
Bilirubin Urine: NEGATIVE
Glucose, UA: NEGATIVE mg/dL
Ketones, ur: NEGATIVE mg/dL
Nitrite: POSITIVE — AB
Protein, ur: >300 mg/dL — AB
Specific Gravity, Urine: 1.02 (ref 1.005–1.030)
pH: 6.5 (ref 5.0–8.0)

## 2022-05-23 LAB — CBC WITH DIFFERENTIAL/PLATELET
Abs Immature Granulocytes: 0.13 10*3/uL — ABNORMAL HIGH (ref 0.00–0.07)
Basophils Absolute: 0.1 10*3/uL (ref 0.0–0.1)
Basophils Relative: 0 %
Eosinophils Absolute: 0 10*3/uL (ref 0.0–0.5)
Eosinophils Relative: 0 %
HCT: 42.5 % (ref 36.0–46.0)
Hemoglobin: 13.1 g/dL (ref 12.0–15.0)
Immature Granulocytes: 1 %
Lymphocytes Relative: 8 %
Lymphs Abs: 1 10*3/uL (ref 0.7–4.0)
MCH: 27.2 pg (ref 26.0–34.0)
MCHC: 30.8 g/dL (ref 30.0–36.0)
MCV: 88.2 fL (ref 80.0–100.0)
Monocytes Absolute: 1.4 10*3/uL — ABNORMAL HIGH (ref 0.1–1.0)
Monocytes Relative: 11 %
Neutro Abs: 10.4 10*3/uL — ABNORMAL HIGH (ref 1.7–7.7)
Neutrophils Relative %: 80 %
Platelets: 461 10*3/uL — ABNORMAL HIGH (ref 150–400)
RBC: 4.82 MIL/uL (ref 3.87–5.11)
RDW: 14.3 % (ref 11.5–15.5)
WBC: 13.1 10*3/uL — ABNORMAL HIGH (ref 4.0–10.5)
nRBC: 0 % (ref 0.0–0.2)

## 2022-05-23 LAB — URINALYSIS, MICROSCOPIC (REFLEX): WBC, UA: >50 WBC/hpf (ref 0–5)

## 2022-05-23 LAB — COMPREHENSIVE METABOLIC PANEL
ALT: 30 U/L (ref 0–44)
AST: 27 U/L (ref 15–41)
Albumin: 3.7 g/dL (ref 3.5–5.0)
Alkaline Phosphatase: 96 U/L (ref 38–126)
Anion gap: 9 (ref 5–15)
BUN: 21 mg/dL (ref 8–23)
CO2: 24 mmol/L (ref 22–32)
Calcium: 9.7 mg/dL (ref 8.9–10.3)
Chloride: 110 mmol/L (ref 98–111)
Creatinine, Ser: 1.61 mg/dL — ABNORMAL HIGH (ref 0.44–1.00)
GFR, Estimated: 33 mL/min — ABNORMAL LOW (ref >60)
Glucose, Bld: 139 mg/dL — ABNORMAL HIGH (ref 70–99)
Potassium: 4.1 mmol/L (ref 3.5–5.1)
Sodium: 143 mmol/L (ref 135–145)
Total Bilirubin: 0.7 mg/dL (ref 0.3–1.2)
Total Protein: 7.8 g/dL (ref 6.5–8.1)

## 2022-05-23 LAB — PHOSPHORUS: Phosphorus: 2.3 mg/dL — ABNORMAL LOW (ref 2.5–4.6)

## 2022-05-23 LAB — CBG MONITORING, ED: Glucose-Capillary: 129 mg/dL — ABNORMAL HIGH (ref 70–99)

## 2022-05-23 LAB — MAGNESIUM
Magnesium: 1.9 mg/dL (ref 1.7–2.4)
Magnesium: 2.2 mg/dL (ref 1.7–2.4)

## 2022-05-23 LAB — TROPONIN I (HIGH SENSITIVITY)
Troponin I (High Sensitivity): 27 ng/L — ABNORMAL HIGH (ref <18)
Troponin I (High Sensitivity): 31 ng/L — ABNORMAL HIGH (ref <18)

## 2022-05-23 LAB — PROCALCITONIN: Procalcitonin: 1.71 ng/mL

## 2022-05-23 LAB — LACTIC ACID, PLASMA: Lactic Acid, Venous: 1.4 mmol/L (ref 0.5–1.9)

## 2022-05-23 MED ORDER — NITROGLYCERIN 0.4 MG SL SUBL: 0.4000 mg | SUBLINGUAL_TABLET | SUBLINGUAL | Status: DC | PRN

## 2022-05-23 MED ORDER — TRAZODONE HCL 50 MG PO TABS
50.0000 mg | ORAL_TABLET | Freq: Every evening | ORAL | Status: DC | PRN
  Filled 2022-05-23: qty 1

## 2022-05-23 MED ORDER — CARVEDILOL 6.25 MG PO TABS
6.2500 mg | ORAL_TABLET | Freq: Two times a day (BID) | ORAL | Status: DC
  Administered 2022-05-23 – 2022-05-28 (×10): 6.25 mg via ORAL
  Filled 2022-05-23 (×10): qty 1

## 2022-05-23 MED ORDER — HYDRALAZINE HCL 20 MG/ML IJ SOLN: 5.0000 mg | Freq: Four times a day (QID) | INTRAMUSCULAR | Status: AC | PRN

## 2022-05-23 MED ORDER — ONDANSETRON HCL 4 MG/2ML IJ SOLN: 4.0000 mg | Freq: Four times a day (QID) | INTRAMUSCULAR | Status: DC | PRN

## 2022-05-23 MED ORDER — SODIUM CHLORIDE 0.9 % IV BOLUS (SEPSIS)
1000.0000 mL | Freq: Once | INTRAVENOUS | Status: AC
  Administered 2022-05-23: 1000 mL via INTRAVENOUS

## 2022-05-23 MED ORDER — METRONIDAZOLE 500 MG/100ML IV SOLN
500.0000 mg | Freq: Two times a day (BID) | INTRAVENOUS | Status: DC
  Administered 2022-05-23: 500 mg via INTRAVENOUS
  Filled 2022-05-23: qty 100

## 2022-05-23 MED ORDER — ACETAMINOPHEN 650 MG RE SUPP: 650.0000 mg | Freq: Four times a day (QID) | RECTAL | Status: DC | PRN

## 2022-05-23 MED ORDER — ACETAMINOPHEN 325 MG PO TABS
650.0000 mg | ORAL_TABLET | Freq: Four times a day (QID) | ORAL | Status: DC | PRN
  Administered 2022-05-23: 650 mg via ORAL
  Filled 2022-05-23: qty 2

## 2022-05-23 MED ORDER — SENNOSIDES-DOCUSATE SODIUM 8.6-50 MG PO TABS: 1.0000 | ORAL_TABLET | Freq: Every evening | ORAL | Status: DC | PRN

## 2022-05-23 MED ORDER — VANCOMYCIN HCL IN DEXTROSE 1-5 GM/200ML-% IV SOLN
1000.0000 mg | Freq: Once | INTRAVENOUS | Status: AC
  Administered 2022-05-23: 1000 mg via INTRAVENOUS
  Filled 2022-05-23: qty 200

## 2022-05-23 MED ORDER — METOPROLOL TARTRATE 5 MG/5ML IV SOLN: 5.0000 mg | INTRAVENOUS | Status: DC | PRN

## 2022-05-23 MED ORDER — SODIUM CHLORIDE 0.9 % IV SOLN
2.0000 g | Freq: Once | INTRAVENOUS | Status: AC
  Administered 2022-05-23: 2 g via INTRAVENOUS
  Filled 2022-05-23: qty 12.5

## 2022-05-23 MED ORDER — ENOXAPARIN SODIUM 40 MG/0.4ML IJ SOSY
40.0000 mg | PREFILLED_SYRINGE | Freq: Every day | INTRAMUSCULAR | Status: DC
Start: 2022-05-23 — End: 2022-05-24
  Administered 2022-05-23: 40 mg via SUBCUTANEOUS
  Filled 2022-05-23: qty 0.4

## 2022-05-23 MED ORDER — ASPIRIN 81 MG PO TBEC
81.0000 mg | DELAYED_RELEASE_TABLET | Freq: Every day | ORAL | Status: DC
  Administered 2022-05-23 – 2022-05-28 (×6): 81 mg via ORAL
  Filled 2022-05-23 (×6): qty 1

## 2022-05-23 MED ORDER — LACTATED RINGERS IV BOLUS
1000.0000 mL | Freq: Once | INTRAVENOUS | Status: AC
  Administered 2022-05-23: 1000 mL via INTRAVENOUS

## 2022-05-23 MED ORDER — VANCOMYCIN HCL 750 MG/150ML IV SOLN: 750.0000 mg | INTRAVENOUS | Status: DC

## 2022-05-23 MED ORDER — POLYETHYLENE GLYCOL 3350 17 G PO PACK: 17.0000 g | PACK | Freq: Every day | ORAL | Status: DC | PRN

## 2022-05-23 MED ORDER — ONDANSETRON HCL 4 MG PO TABS: 4.0000 mg | ORAL_TABLET | Freq: Four times a day (QID) | ORAL | Status: DC | PRN

## 2022-05-23 MED ORDER — DONEPEZIL HCL 5 MG PO TABS
10.0000 mg | ORAL_TABLET | Freq: Every day | ORAL | Status: DC
  Administered 2022-05-23 – 2022-05-27 (×5): 10 mg via ORAL
  Filled 2022-05-23 (×5): qty 2

## 2022-05-23 MED ORDER — ATORVASTATIN CALCIUM 20 MG PO TABS
40.0000 mg | ORAL_TABLET | Freq: Every day | ORAL | Status: DC
  Administered 2022-05-23 – 2022-05-27 (×5): 40 mg via ORAL
  Filled 2022-05-23 (×5): qty 2

## 2022-05-23 MED ORDER — SODIUM CHLORIDE 0.9 % IV SOLN
2.0000 g | INTRAVENOUS | Status: AC
  Administered 2022-05-24 – 2022-05-27 (×4): 2 g via INTRAVENOUS
  Filled 2022-05-23 (×3): qty 20
  Filled 2022-05-23: qty 2

## 2022-05-23 MED ORDER — METRONIDAZOLE 500 MG/100ML IV SOLN
500.0000 mg | Freq: Once | INTRAVENOUS | Status: AC
  Administered 2022-05-23: 500 mg via INTRAVENOUS
  Filled 2022-05-23: qty 100

## 2022-05-23 NOTE — Progress Notes (Signed)
Pharmacy Antibiotic Note  Yolanda Moore is a 76 y.o. female admitted on 05/23/2022 with sepsis.  Pharmacy has been consulted for Vancomcyin dosing. Patient is also ordered Ceftriaxone.  Plan: Vancomycin 1g IV loading dose Vancomycin 750 mg IV Q 48 hrs. Goal AUC 400-550. Expected AUC: 479 SCr used: 1.61 Expected Cmin: 11.4   Weight: 69.9 kg (154 lb 1.6 oz)  Temp (24hrs), Avg:100.8 F (38.2 C), Min:98.7 F (37.1 C), Max:102.9 F (39.4 C)  Recent Labs  Lab 05/23/22 1015  WBC 13.1*  CREATININE 1.61*  LATICACIDVEN 1.4    Estimated Creatinine Clearance: 26.4 mL/min (A) (by C-G formula based on SCr of 1.61 mg/dL (H)).    No Known Allergies  Antimicrobials this admission: Cefepime 7/17 x 1 Ceftriaxone 7/17 >>  Vancomcyin 7/17 >>  Dose adjustments this admission:  Microbiology results: 7/17 BCx: pending 7/17 UCx: pending   Thank you for allowing pharmacy to be a part of this patient's care.  Clovia Cuff, PharmD, BCPS 05/23/2022 1:12 PM

## 2022-05-23 NOTE — H&P (Signed)
History and Physical   Yolanda Moore EPP:[id number] DOB: [birth year] DOA: 05/23/2022  PCP: Marisue Ivan, MD  Outpatient Specialists: Dr. Gillermo Murdoch clinic cardiology Patient coming from: Home via EMS  I have personally briefly reviewed patient's old medical records in [facility] EMR.  Chief Concern: Altered mental status, fever  HPI: Ms. 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  Initial vitals in the emergency department showed temperature of 102, respiration rate of 20, heart rate 81, blood pressure 133/65, SPO2 of 98% on room air.  Serum sodium is 143, potassium 4.3, chloride 110, bicarb 24, BUN of 21, serum creatinine 1.61, GFR of 33, nonfasting blood glucose 139, WBC 13.1, hemoglobin 13.1, platelets of 461.  High since troponin was 28 7, lactic acid 1.4, UA was positive for large leukocytes and positive for nitrates.  1 blood culture have been collected and are pending.  Second blood culture is pending collection.  Urine culture is in process.  Portable chest x-ray ordered by EDP was read as no active disease.  CT of the head without contrast was read as: No acute intracranial abnormality  ED treatment: Cefepime 2 g IV, LR 1 L bolus, metronidazole 500 mg one-time dose, vancomycin 1 g IV one-time dose.  At bedside, she is able to tell me her full name.  She was not able to tell me her age.  She states that she is 76 years old.  She was not able to tell me her current location, current calendar year.  Family states that this would be baseline for her.  Family reports that they visit her every day.  Yolanda Moore visits her in the morning at least once per day and assist her in waking up at [facility].  Yolanda Moore states that she usually she will say hello and  wakes up right away and goes about her regular morning activity with her daughter.  Her daughter Yolanda Moore visits her every afternoon and evening.  Per Yolanda Moore, patient was her normal self on evening of 05/22/2022.    This morning, Yolanda Moore states that patient was non responsive like usual, instead patient was looking up at her daughter without answering. Finally, after about 1 minute, patient she responded with 1 answer questions.  She states she is okay.  Family denies fever, reported vomiting, passing out or lost of consciounness.   Social history: She lives at [facility].  Family reports that she is a former tobacco user quitting more than 40 years ago.  Family states that patient does not use EtOH or recreational drug use.  She is retired and formerly worked in [employer].  ROS: Unable to complete as patient has moderate to advanced dementia  ED Course: Discussed with emergency medicine provider, patient requiring hospitalization for chief concerns of meeting sepsis criteria.  Assessment/Plan  Principal Problem:   Sepsis secondary to UTI [facility]) Active Problems:   Essential hypertension   Myocardial injury   Chronic kidney disease, stage 3b (HCC)   Dementia without behavioral disturbance (HCC)   Weakness   Insomnia   Hyperlipidemia   Assessment and Plan:  * Sepsis secondary to UTI (HCC) - Presumptive diagnosis secondary to UTI - Urine culture is in process, 1 blood culture culture is in process at the time of this dictation - Second blood culture is pending collection -  Continue with metronidazole 500 mg IV every 12 hours, vancomycin per pharmacy, and ceftriaxone 2 g daily starting on 05/24/2022 - Given that patient does not have a history of diabetes mellitus, I do not believe the patient is at risk for Pseudomonas infection at this time, therefore I have not resumed patient on cefepime - Maintain MAP greater than 65, patient is maintaining this - First lactic acid was  negative and patient does not appear septic on clinical presentation - Admit to telemetry cardiac, inpatient  Myocardial injury - With elevated high sensitivity troponin, presumed secondary to sepsis - Low clinical suspicion for ACS given that patient denies chest pain and there is no ischemic EKG changes - We will follow with second high sensitivity troponin - No indication for heparin at this time  Essential hypertension - Coreg 6.25 mg p.o. twice daily resumed - I have held resuming losartan 100 mg daily on admission due to sepsis presentation - AM team to resume when appropriate - Hydralazine 5 mg IV every 6 hours as needed for SBP greater than 180, 2 days ordered  Hyperlipidemia - Atorvastatin 40 mg nightly resumed  Insomnia - Resumed trazodone 50 mg nightly as needed for sleep  Dementia without behavioral disturbance ([place]) - Resumed home donepezil 10 mg nightly  Chronic kidney disease, stage 3b ([place]) - At baseline - Serum creatinine range has been 1.25-1.76/GFR 33-45 over the last year - BMP in the a.m.  Chart reviewed.   DVT prophylaxis: Enoxaparin Code Status: Limited; family does not want patient to have chest compressions or be resuscitated if her heart were to stop beating.  Family is okay with intubation if needed. Diet: Heart healthy Family Communication: Updated daughters at bedside, Yolanda Moore and Yolanda Moore Disposition Plan: Pending clinical course Consults called: Pharmacy Admission status: Telemetry cardiac, inpatient  Past Medical History:  Diagnosis Date   Ankle fracture, right 04/19/2020   Chronic kidney disease    Was told many yrs ago that 1 kidney doesn't work   Dementia ([place])    HTN (hypertension)    MI (myocardial infarction) ([place]) 09/07/2020   Wears dentures    Partial lower   Past Surgical History:  Procedure Laterality Date   ABDOMINAL HYSTERECTOMY     CATARACT EXTRACTION W/PHACO Right 02/08/2021   Procedure: CATARACT EXTRACTION PHACO AND  INTRAOCULAR LENS PLACEMENT ([place]) RIGHT;  Surgeon: Eulogio Bear, MD;  Location: [place];  Service: Ophthalmology;  Laterality: Right;  3.32 0:28.4   CATARACT EXTRACTION W/PHACO Left 02/22/2021   Procedure: CATARACT EXTRACTION PHACO AND INTRAOCULAR LENS PLACEMENT ([facility]) LEFT;  Surgeon: Eulogio Bear, MD;  Location: [place];  Service: Ophthalmology;  Laterality: Left;  2.17 0:21.2   Social History:  reports that she quit smoking about 33 years ago. She has never used smokeless tobacco. She reports that she does not currently use alcohol. She reports that she does not use drugs.  No Known Allergies Family History  Problem Relation Age of Onset   Cancer Mother    Cancer Father    Ovarian cancer Daughter    Family history: Family history reviewed and not pertinent.  Prior to Admission medications   Medication Sig Start Date End Date Taking? Authorizing Provider  aspirin 81 MG EC tablet Take by mouth.   Yes [provider]  atorvastatin (LIPITOR) 40 MG tablet Take 1 tablet (40 mg total) by mouth daily. 09/11/20 05/23/22 Yes Wyvonnia Dusky, MD  carvedilol (COREG) 6.25 MG tablet Take 1 tablet (6.25 mg  total) by mouth 2 (two) times daily with a meal. 09/10/20 05/23/22 Yes Charise Killian, MD  donepezil (ARICEPT) 10 MG tablet Take 10 mg by mouth at bedtime. 04/10/20  Yes [provider]  losartan (COZAAR) 100 MG tablet Take 100 mg by mouth daily. 02/14/20  Yes [provider]  traZODone (DESYREL) 50 MG tablet Take 50 mg by mouth at bedtime as needed. 12/30/21  Yes [provider]  cephALEXin (KEFLEX) 500 MG capsule Take 1 capsule (500 mg total) by mouth 3 (three) times daily. Patient not taking: Reported on 05/23/2022 01/04/22   Sharman Cheek, MD  nitroGLYCERIN (NITROSTAT) 0.4 MG SL tablet Place 0.4 mg under the tongue every 5 (five) minutes as needed for chest pain.    [provider]  ondansetron (ZOFRAN-ODT) 4 MG  disintegrating tablet Take 1 tablet (4 mg total) by mouth every 8 (eight) hours as needed for nausea or vomiting. Patient not taking: Reported on 05/23/2022 01/04/22   Sharman Cheek, MD   Physical Exam: Vitals:   05/23/22 1032 05/23/22 1200 05/23/22 1247 05/23/22 1334  BP:  129/61    Pulse:  88    Resp:  (!) 21    Temp: (!) 102.9 F (39.4 C)   98.7 F (37.1 C)  TempSrc: Rectal   Oral  SpO2:  98%    Weight:   69.9 kg    Constitutional: appears age appropriate, frail, NAD, calm, comfortable Eyes: PERRL, lids and conjunctivae normal ENMT: Mucous membranes are moist. Posterior pharynx clear of any exudate or lesions. Age-appropriate dentition. Hearing appropriate Neck: normal, supple, no masses, no thyromegaly Respiratory: clear to auscultation bilaterally, no wheezing, no crackles. Normal respiratory effort. No accessory muscle use.  Cardiovascular: Regular rate and rhythm, no murmurs / rubs / gallops. No extremity edema. 2+ pedal pulses. No carotid bruits.  Abdomen: no tenderness, no masses palpated, no hepatosplenomegaly. Bowel sounds positive.  Musculoskeletal: no clubbing / cyanosis. No joint deformity upper and lower extremities. Good ROM, no contractures, no atrophy. Normal muscle tone.  Skin: no rashes, lesions, ulcers. No induration Neurologic: Sensation intact. Strength 5/5 in all 4.  Psychiatric: Lacks judgment and insight in setting of dementia. Alert and oriented x self.  Flat affect.   EKG: independently reviewed, showing sinus rhythm with rate of 94, QTc 446, evidence of LVH  Chest x-ray on Admission: I personally reviewed and I agree with radiologist reading as below.  CT Head Wo Contrast  Result Date: 05/23/2022 CLINICAL DATA:  Mental status change EXAM: CT HEAD WITHOUT CONTRAST TECHNIQUE: Contiguous axial images were obtained from the base of the skull through the vertex without intravenous contrast. RADIATION DOSE REDUCTION: This exam was performed according to the  departmental dose-optimization program which includes automated exposure control, adjustment of the mA and/or kV according to patient size and/or use of iterative reconstruction technique. COMPARISON:  Head CT dated Mar 28, 2022 FINDINGS: Brain: Chronic white matter ischemic change and cerebral atrophy, similar to prior. No evidence of acute infarction, hemorrhage, hydrocephalus, extra-axial collection or mass lesion/mass effect. Vascular: No hyperdense vessel or unexpected calcification. Skull: Normal. Negative for fracture or focal lesion. Sinuses/Orbits: No acute finding. Other: None. IMPRESSION: No acute intracranial abnormality. Electronically Signed   By: Allegra Lai M.D.   On: 05/23/2022 10:44   DG Chest Portable 1 View  Result Date: 05/23/2022 CLINICAL DATA:  Sepsis. EXAM: PORTABLE CHEST 1 VIEW COMPARISON:  January 03, 2022. FINDINGS: The heart size and mediastinal contours are within normal limits. Both lungs  are clear. The visualized skeletal structures are unremarkable. IMPRESSION: No active disease. Aortic Atherosclerosis (ICD10-I70.0). Electronically Signed   By: Lupita Raider M.D.   On: 05/23/2022 10:38    Labs on Admission: I have personally reviewed following labs  CBC: Recent Labs  Lab 05/23/22 1015  WBC 13.1*  NEUTROABS 10.4*  HGB 13.1  HCT 42.5  MCV 88.2  PLT 461*   Basic Metabolic Panel: Recent Labs  Lab 05/23/22 1015  NA 143  K 4.1  CL 110  CO2 24  GLUCOSE 139*  BUN 21  CREATININE 1.61*  CALCIUM 9.7   GFR: Estimated Creatinine Clearance: 26.4 mL/min (A) (by C-G formula based on SCr of 1.61 mg/dL (H)).  Liver Function Tests: Recent Labs  Lab 05/23/22 1015  AST 27  ALT 30  ALKPHOS 96  BILITOT 0.7  PROT 7.8  ALBUMIN 3.7   Urine analysis:    Component Value Date/Time   COLORURINE YELLOW 05/23/2022 1015   APPEARANCEUR CLOUDY (A) 05/23/2022 1015   LABSPEC 1.020 05/23/2022 1015   PHURINE 6.5 05/23/2022 1015   GLUCOSEU NEGATIVE 05/23/2022 1015    HGBUR LARGE (A) 05/23/2022 1015   BILIRUBINUR NEGATIVE 05/23/2022 1015   KETONESUR NEGATIVE 05/23/2022 1015   PROTEINUR >300 (A) 05/23/2022 1015   NITRITE POSITIVE (A) 05/23/2022 1015   LEUKOCYTESUR LARGE (A) 05/23/2022 1015   CRITICAL CARE Performed by: Nadyne Coombes Roger Fasnacht  Total critical care time: 35 minutes  Critical care time was exclusive of separately billable procedures and treating other patients.  Critical care was necessary to treat or prevent imminent or life-threatening deterioration.  Critical care was time spent personally by me on the following activities: development of treatment plan with patient and/or surrogate as well as nursing, discussions with consultants, evaluation of patient's response to treatment, examination of patient, obtaining history from patient or surrogate, ordering and performing treatments and interventions, ordering and review of laboratory studies, ordering and review of radiographic studies, pulse oximetry and re-evaluation of patient's condition.  Dr. Sedalia Muta [facility]  If 7PM-7AM, please contact overnight-coverage provider If 7AM-7PM, please contact day coverage provider [email or web address]  05/23/2022, 1:53 PM

## 2022-05-23 NOTE — ED Triage Notes (Signed)
Pt BIB [facility] with reports of AMS, fever, and 2 runs on [facility] per EMS.

## 2022-05-23 NOTE — Assessment & Plan Note (Addendum)
Continue home donepezil 10 mg nightly

## 2022-05-23 NOTE — Progress Notes (Signed)
Elink monitoring code sepsis 

## 2022-05-23 NOTE — ED Provider Notes (Signed)
[facility] Provider Note    Event Date/Time   First MD Initiated Contact with Patient 05/23/22 1008     (approximate)   History   Chief Complaint Altered Mental Status  HPI  Yolanda Moore is a 76 y.o. female with past medical history of hypertension, CAD, CKD, and dementia who presents to the ED for altered mental status.  History is limited due to patient's baseline dementia and majority of history is obtained from EMS.  They state that patient resides at home and family found her this morning more confused than usual with strong smell of urine.  When they arrived, they found patient to have axillary temperature of 101.6 with associated tachycardia.  Patient currently denies any complaints, states that she "feels fine."  EMS provider states that she has cared for patient previously and that she seems much more somnolent than usual.  EMS reports that patient typically is interactive and able to answer questions appropriately, but had difficulty answering questions in the ambulance.  EMS also concerned that patient had 2 separate runs of ventricular tachycardia, each lasting 10-12 beats.     Physical Exam   Triage Vital Signs: ED Triage Vitals  Enc Vitals Group     BP      Pulse      Resp      Temp      Temp src      SpO2      Weight      Height      Head Circumference      Peak Flow      Pain Score      Pain Loc      Pain Edu?      Excl. in GC?     Most recent vital signs: Vitals:   05/23/22 1032 05/23/22 1200  BP:  129/61  Pulse:  88  Resp:  (!) 21  Temp: (!) 102.9 F (39.4 C)   SpO2:  98%    Constitutional: Alert and oriented to person, but not place, time, or situation. Eyes: Conjunctivae are normal. Head: Atraumatic. Nose: No congestion/rhinnorhea. Mouth/Throat: Mucous membranes are moist.  Cardiovascular: Tachycardic, regular rhythm. Grossly normal heart sounds.  2+ radial pulses bilaterally. Respiratory: Normal respiratory  effort.  No retractions. Lungs CTAB. Gastrointestinal: Soft and nontender. No distention. Musculoskeletal: No lower extremity tenderness nor edema.  Neurologic:  Normal speech and language.  Global weakness noted with no gross focal neurologic deficits appreciated.    ED Results / Procedures / Treatments   Labs (all labs ordered are listed, but only abnormal results are displayed) Labs Reviewed  CBC WITH DIFFERENTIAL/PLATELET - Abnormal; Notable for the following components:      Result Value   WBC 13.1 (*)    Platelets 461 (*)    Neutro Abs 10.4 (*)    Monocytes Absolute 1.4 (*)    Abs Immature Granulocytes 0.13 (*)    All other components within normal limits  COMPREHENSIVE METABOLIC PANEL - Abnormal; Notable for the following components:   Glucose, Bld 139 (*)    Creatinine, Ser 1.61 (*)    GFR, Estimated 33 (*)    All other components within normal limits  URINALYSIS, ROUTINE W REFLEX MICROSCOPIC - Abnormal; Notable for the following components:   APPearance CLOUDY (*)    Hgb urine dipstick LARGE (*)    Protein, ur >300 (*)    Nitrite POSITIVE (*)    Leukocytes,Ua LARGE (*)    All  other components within normal limits  URINALYSIS, MICROSCOPIC (REFLEX) - Abnormal; Notable for the following components:   Bacteria, UA MANY (*)    All other components within normal limits  TROPONIN I (HIGH SENSITIVITY) - Abnormal; Notable for the following components:   Troponin I (High Sensitivity) 27 (*)    All other components within normal limits  URINE CULTURE  CULTURE, BLOOD (ROUTINE X 2)  CULTURE, BLOOD (ROUTINE X 2)  LACTIC ACID, PLASMA  PROCALCITONIN  MAGNESIUM  PHOSPHORUS  MAGNESIUM  TROPONIN I (HIGH SENSITIVITY)     EKG  ED ECG REPORT I, Chesley Noon, the attending physician, personally viewed and interpreted this ECG.   Date: 05/23/2022  EKG Time: 10:14  Rate: 94  Rhythm: normal sinus rhythm  Axis: Normal  Intervals:none  ST&T Change: Anterolateral T wave  inversions, similar to previous  RADIOLOGY CT head reviewed and interpreted by me with no hemorrhage or midline shift.  PROCEDURES:  Critical Care performed: Yes, see critical care procedure note(s)  .Critical Care  Performed by: Chesley Noon, MD Authorized by: Chesley Noon, MD   Critical care provider statement:    Critical care time (minutes):  30   Critical care time was exclusive of:  Separately billable procedures and treating other patients and teaching time   Critical care was necessary to treat or prevent imminent or life-threatening deterioration of the following conditions:  Sepsis   Critical care was time spent personally by me on the following activities:  Development of treatment plan with patient or surrogate, discussions with consultants, evaluation of patient's response to treatment, examination of patient, ordering and review of laboratory studies, ordering and review of radiographic studies, ordering and performing treatments and interventions, pulse oximetry, re-evaluation of patient's condition and review of old charts   I assumed direction of critical care for this patient from another provider in my specialty: no     Care discussed with: admitting provider      MEDICATIONS ORDERED IN ED: Medications  atorvastatin (LIPITOR) tablet 40 mg (has no administration in time range)  nitroGLYCERIN (NITROSTAT) SL tablet 0.4 mg (has no administration in time range)  carvedilol (COREG) tablet 6.25 mg (has no administration in time range)  traZODone (DESYREL) tablet 50 mg (has no administration in time range)  donepezil (ARICEPT) tablet 10 mg (has no administration in time range)  acetaminophen (TYLENOL) tablet 650 mg (650 mg Oral Given 05/23/22 1257)    Or  acetaminophen (TYLENOL) suppository 650 mg ( Rectal See Alternative 05/23/22 1257)  ondansetron (ZOFRAN) tablet 4 mg (has no administration in time range)    Or  ondansetron (ZOFRAN) injection 4 mg (has no  administration in time range)  enoxaparin (LOVENOX) injection 40 mg (has no administration in time range)  sodium chloride 0.9 % bolus 1,000 mL (1,000 mLs Intravenous New Bag/Given 05/23/22 1302)  cefTRIAXone (ROCEPHIN) 2 g in sodium chloride 0.9 % 100 mL IVPB (has no administration in time range)  senna-docusate (Senokot-S) tablet 1 tablet (has no administration in time range)  metroNIDAZOLE (FLAGYL) IVPB 500 mg (has no administration in time range)  hydrALAZINE (APRESOLINE) injection 5 mg (has no administration in time range)  aspirin EC tablet 81 mg (has no administration in time range)  metoprolol tartrate (LOPRESSOR) injection 5 mg (has no administration in time range)  polyethylene glycol (MIRALAX / GLYCOLAX) packet 17 g (has no administration in time range)  vancomycin (VANCOREADY) IVPB 750 mg/150 mL (has no administration in time range)  lactated ringers bolus 1,000 mL (  0 mLs Intravenous Stopped 05/23/22 1244)  ceFEPIme (MAXIPIME) 2 g in sodium chloride 0.9 % 100 mL IVPB (0 g Intravenous Stopped 05/23/22 1135)  metroNIDAZOLE (FLAGYL) IVPB 500 mg (0 mg Intravenous Stopped 05/23/22 1235)  vancomycin (VANCOCIN) IVPB 1000 mg/200 mL premix (0 mg Intravenous Stopped 05/23/22 1243)     IMPRESSION / MDM / ASSESSMENT AND PLAN / ED COURSE  I reviewed the triage vital signs and the nursing notes.                              76 y.o. female with past medical history of hypertension, CAD, CKD, and dementia who presents to the ED for altered mental status noted by family this morning with concern for fever and tachycardia per EMS.  Patient's presentation is most consistent with acute presentation with potential threat to life or bodily function.  Differential diagnosis includes, but is not limited to, sepsis, UTI, pneumonia, intra-abdominal process, intracranial process, arrhythmia, ACS, PE, electrolyte abnormality, AKI.  Patient chronically ill-appearing but in no acute distress, vital signs  remarkable for mild tachycardia but otherwise reassuring with normal oral temperature.  We will check rectal temperature given fever reported by EMS, initiate sepsis work-up given her altered mental status.  She has no focal neurologic deficits on exam, will check CT head for alternative explanation for her altered mental status.  Labs, chest x-ray, and urinalysis are pending.  Rectal temperature significantly elevated consistent with sepsis, patient subsequently started on broad-spectrum antibiotics.  Labs thus far remarkable for mild leukocytosis with no anemia, lactic acid within normal limits.  Patient has mildly elevated troponin but this is likely related to her chronic kidney disease that is unchanged today, no significant electrolyte abnormality.  Urinalysis appears concerning for infection, which is the likely source of her sepsis.  Case discussed with hospitalist for admission.      FINAL CLINICAL IMPRESSION(S) / ED DIAGNOSES   Final diagnoses:  Sepsis without acute organ dysfunction, due to unspecified organism (HCC)  Acute cystitis without hematuria  Altered mental status, unspecified altered mental status type     Rx / DC Orders   ED Discharge Orders     None        Note:  This document was prepared using Dragon voice recognition software and may include unintentional dictation errors.   Chesley Noon, MD 05/23/22 1315

## 2022-05-23 NOTE — Assessment & Plan Note (Signed)
Continue trazodone 50 mg nightly as needed for sleep

## 2022-05-23 NOTE — Assessment & Plan Note (Signed)
-   Presumptive diagnosis secondary to UTI - Urine culture is in process, 1 blood culture culture is in process at the time of this dictation - Second blood culture is pending collection - Continue with metronidazole 500 mg IV every 12 hours, vancomycin per pharmacy, and ceftriaxone 2 g daily starting on 05/24/2022 - Given that patient does not have a history of diabetes mellitus, I do not believe the patient is at risk for Pseudomonas infection at this time, therefore I have not resumed patient on cefepime - Maintain MAP greater than 65, patient is maintaining this - First lactic acid was negative and patient does not appear septic on clinical presentation - Admit to telemetry cardiac, inpatient

## 2022-05-23 NOTE — Assessment & Plan Note (Deleted)
With creatinine 1.44 with a GFR of 38

## 2022-05-23 NOTE — Progress Notes (Addendum)
I rounded on this patient with the day nurse and patient seemed tired but had eyes open and was able to communicate although she did not remember her age or where she was. However, upon completing her admission, she kept falling asleep and had to be awakened by touch and voice. I took her vitals and they are stable. Concerned about her sudden change in mental status. Contacted Dr. Arville Care.   Update: Dr. Arville Care added Dr. Sedalia Muta to the notification chat and Dr. Sedalia Muta stated that this is not a change from patient's baseline.

## 2022-05-23 NOTE — Progress Notes (Signed)
PHARMACY - PHYSICIAN COMMUNICATION CRITICAL VALUE ALERT - BLOOD CULTURE IDENTIFICATION (BCID)  Yolanda Moore is an 76 y.o. female who presented to [facility] on 05/23/2022 with a chief complaint of AMS, fever  Assessment:   1/4 GNR - E. coli with no resistance detected. Presumed 2/2 urinary source  Name of physician (or Provider) Contacted: Dr. Arville Care  Current antibiotics: ceftriaxone, Vancomycin, metronidazole  Changes to prescribed antibiotics recommended: First line therapy is ceftriaxone 2 gram Q24H. Recommend continuing ceftriaxone and discontinuing Vancomycin and metronidazole at this time Recommendations accepted by provider  Results for orders placed or performed during the hospital encounter of 05/23/22  Blood Culture ID Panel (Reflexed) (Collected: 05/23/2022 10:15 AM)  Result Value Ref Range   Enterococcus faecalis NOT DETECTED NOT DETECTED   Enterococcus Faecium NOT DETECTED NOT DETECTED   Listeria monocytogenes NOT DETECTED NOT DETECTED   Staphylococcus species NOT DETECTED NOT DETECTED   Staphylococcus aureus (BCID) NOT DETECTED NOT DETECTED   Staphylococcus epidermidis NOT DETECTED NOT DETECTED   Staphylococcus lugdunensis NOT DETECTED NOT DETECTED   Streptococcus species NOT DETECTED NOT DETECTED   Streptococcus agalactiae NOT DETECTED NOT DETECTED   Streptococcus pneumoniae NOT DETECTED NOT DETECTED   Streptococcus pyogenes NOT DETECTED NOT DETECTED   A.calcoaceticus-baumannii NOT DETECTED NOT DETECTED   Bacteroides fragilis NOT DETECTED NOT DETECTED   Enterobacterales DETECTED (A) NOT DETECTED   Enterobacter cloacae complex NOT DETECTED NOT DETECTED   Escherichia coli DETECTED (A) NOT DETECTED   Klebsiella aerogenes NOT DETECTED NOT DETECTED   Klebsiella oxytoca NOT DETECTED NOT DETECTED   Klebsiella pneumoniae NOT DETECTED NOT DETECTED   Proteus species NOT DETECTED NOT DETECTED   Salmonella species NOT DETECTED NOT DETECTED   Serratia marcescens NOT DETECTED NOT  DETECTED   Haemophilus influenzae NOT DETECTED NOT DETECTED   Neisseria meningitidis NOT DETECTED NOT DETECTED   Pseudomonas aeruginosa NOT DETECTED NOT DETECTED   Stenotrophomonas maltophilia NOT DETECTED NOT DETECTED   Candida albicans NOT DETECTED NOT DETECTED   Candida auris NOT DETECTED NOT DETECTED   Candida glabrata NOT DETECTED NOT DETECTED   Candida krusei NOT DETECTED NOT DETECTED   Candida parapsilosis NOT DETECTED NOT DETECTED   Candida tropicalis NOT DETECTED NOT DETECTED   Cryptococcus neoformans/gattii NOT DETECTED NOT DETECTED   CTX-M ESBL NOT DETECTED NOT DETECTED   Carbapenem resistance IMP NOT DETECTED NOT DETECTED   Carbapenem resistance KPC NOT DETECTED NOT DETECTED   Carbapenem resistance NDM NOT DETECTED NOT DETECTED   Carbapenem resist OXA 48 LIKE NOT DETECTED NOT DETECTED   Carbapenem resistance VIM NOT DETECTED NOT DETECTED    Sharen Hones, PharmD, BCPS Clinical Pharmacist   05/23/2022  11:38 PM

## 2022-05-23 NOTE — Assessment & Plan Note (Addendum)
Continue Coreg 

## 2022-05-23 NOTE — Progress Notes (Signed)
CODE SEPSIS - PHARMACY COMMUNICATION  **Broad Spectrum Antibiotics should be administered within 1 hour of Sepsis diagnosis**  Time Code Sepsis Called/Page Received: 10:32  Antibiotics Ordered: Cefepime, Vancomycin, and Metronidazole  Time of 1st antibiotic administration: Cefepime given at 11:02  Additional action taken by pharmacy: n/a  If necessary, Name of Provider/Nurse Contacted: n/a    Foye Deer ,PharmD Clinical Pharmacist  05/23/2022  10:56 AM

## 2022-05-23 NOTE — Assessment & Plan Note (Deleted)
-   With elevated high sensitivity troponin, presumed secondary to sepsis - Low clinical suspicion for ACS given that patient denies chest pain and there is no ischemic EKG changes - We will follow with second high sensitivity troponin - No indication for heparin at this time

## 2022-05-23 NOTE — Hospital Course (Addendum)
Ms. 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 Urine study consistent with UTI, blood culture came back with E. coli.  Currently treated with Rocephin.

## 2022-05-23 NOTE — Progress Notes (Signed)
PHARMACY -  BRIEF ANTIBIOTIC NOTE   Pharmacy has received consult(s) for Cefepime and Vancomycin from an ED provider.  The patient's profile has been reviewed for ht/wt/allergies/indication/available labs.    One time order(s) placed for Cefepime 2g and Vancomcyin 1g by ED provider.  Further antibiotics/pharmacy consults should be ordered by admitting physician if indicated.                       Thank you, Foye Deer 05/23/2022  10:55 AM

## 2022-05-23 NOTE — Assessment & Plan Note (Signed)
Continue atorvastatin 40 mg nightly resumed

## 2022-05-24 DIAGNOSIS — A4151 Sepsis due to Escherichia coli [E. coli]: Secondary | ICD-10-CM | POA: Diagnosis not present

## 2022-05-24 DIAGNOSIS — I5A Non-ischemic myocardial injury (non-traumatic): Secondary | ICD-10-CM

## 2022-05-24 DIAGNOSIS — N390 Urinary tract infection, site not specified: Secondary | ICD-10-CM | POA: Diagnosis not present

## 2022-05-24 DIAGNOSIS — A419 Sepsis, unspecified organism: Secondary | ICD-10-CM | POA: Diagnosis not present

## 2022-05-24 LAB — BLOOD CULTURE ID PANEL (REFLEXED) - BCID2

## 2022-05-24 LAB — BASIC METABOLIC PANEL
Anion gap: 5 (ref 5–15)
BUN: 22 mg/dL (ref 8–23)
CO2: 27 mmol/L (ref 22–32)
Calcium: 9.1 mg/dL (ref 8.9–10.3)
Chloride: 112 mmol/L — ABNORMAL HIGH (ref 98–111)
Creatinine, Ser: 1.44 mg/dL — ABNORMAL HIGH (ref 0.44–1.00)
GFR, Estimated: 38 mL/min — ABNORMAL LOW (ref >60)
Glucose, Bld: 116 mg/dL — ABNORMAL HIGH (ref 70–99)
Potassium: 3.9 mmol/L (ref 3.5–5.1)
Sodium: 144 mmol/L (ref 135–145)

## 2022-05-24 LAB — CBC
HCT: 40.4 % (ref 36.0–46.0)
Hemoglobin: 12.5 g/dL (ref 12.0–15.0)
MCH: 27.2 pg (ref 26.0–34.0)
MCHC: 30.9 g/dL (ref 30.0–36.0)
MCV: 87.8 fL (ref 80.0–100.0)
Platelets: 411 10*3/uL — ABNORMAL HIGH (ref 150–400)
RBC: 4.6 MIL/uL (ref 3.87–5.11)
RDW: 14.5 % (ref 11.5–15.5)
WBC: 10.8 10*3/uL — ABNORMAL HIGH (ref 4.0–10.5)
nRBC: 0 % (ref 0.0–0.2)

## 2022-05-24 LAB — PROTIME-INR
INR: 1.5 — ABNORMAL HIGH (ref 0.8–1.2)
Prothrombin Time: 18.1 seconds — ABNORMAL HIGH (ref 11.4–15.2)

## 2022-05-24 LAB — PROCALCITONIN: Procalcitonin: 1.81 ng/mL

## 2022-05-24 LAB — CORTISOL-AM, BLOOD: Cortisol - AM: 20.7 ug/dL (ref 6.7–22.6)

## 2022-05-24 MED ORDER — ENOXAPARIN SODIUM 30 MG/0.3ML IJ SOSY
30.0000 mg | PREFILLED_SYRINGE | Freq: Every day | INTRAMUSCULAR | Status: DC
  Administered 2022-05-24 – 2022-05-25 (×2): 30 mg via SUBCUTANEOUS
  Filled 2022-05-24 (×2): qty 0.3

## 2022-05-24 NOTE — Progress Notes (Signed)
  Progress Note   Patient: Yolanda Moore ZOX:[id number] DOB: 30-Dec-1945 DOA: 05/23/2022     1 DOS: the patient was seen and examined on 05/24/2022   Brief hospital course: Ms. 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 Urine study consistent with UTI, blood culture came back with E. coli.  Currently treated with Rocephin.  Assessment and Plan: E. coli septicemia secondary to UTI. E. coli urinary tract infection. Myocardial injury with elevated troponin secondary to sepsis. Patient condition is improving, she is hemodynamically stable. Blood culture came back with E. coli, urine culture still pending. Continue Rocephin 2 g every 24 hours.  Chronic kidney disease stage IIIb. Condition stable.  Essential hypertension. Continue Coreg, losartan is on hold, may restart if blood pressure running high again.  Dementia without behavioral disturbance. Continue home treatment.     Subjective:  Patient doing well today, does not have significant short of breath or cough. No abdominal pain nausea vomiting.  Physical Exam: Vitals:   05/23/22 2025 05/23/22 2326 05/24/22 0513 05/24/22 0812  BP: (!) 110/45 122/63 (!) 127/56 136/60  Pulse: 65 70 64 65  Resp: 16 15 17 18   Temp: 98.2 F (36.8 C) 97.9 F (36.6 C) 98 F (36.7 C) 98.4 F (36.9 C)  TempSrc:    Oral  SpO2: 100% 98% 100% 100%  Weight:       General exam: Appears calm and comfortable  Respiratory system: Clear to auscultation. Respiratory effort normal. Cardiovascular system: S1 & S2 heard, RRR. No JVD, murmurs, rubs, gallops or clicks. No pedal edema. Gastrointestinal system: Abdomen is nondistended, soft and nontender. No organomegaly or masses felt. Normal bowel sounds heard. Central nervous system: Alert and  oriented x2. No focal neurological deficits. Extremities: Symmetric 5 x 5 power. Skin: No rashes, lesions or ulcers Psychiatry: Judgement and insight appear normal. Mood & affect appropriate.   Data Reviewed:  Lab results reviewed,  Family Communication: Daughter updated  Disposition: Status is: Inpatient Remains inpatient appropriate because: Severity of disease, IV treatment.  Planned Discharge Destination: Home with Home Health    Time spent: 35 minutes  Author: , MD 05/24/2022 10:17 AM  For on call review [email or web address].

## 2022-05-25 ENCOUNTER — Inpatient Hospital Stay: Payer: Medicare PPO

## 2022-05-25 DIAGNOSIS — A4151 Sepsis due to Escherichia coli [E. coli]: Secondary | ICD-10-CM | POA: Diagnosis not present

## 2022-05-25 DIAGNOSIS — I10 Essential (primary) hypertension: Secondary | ICD-10-CM

## 2022-05-25 DIAGNOSIS — G4700 Insomnia, unspecified: Secondary | ICD-10-CM

## 2022-05-25 DIAGNOSIS — E785 Hyperlipidemia, unspecified: Secondary | ICD-10-CM

## 2022-05-25 DIAGNOSIS — N1832 Chronic kidney disease, stage 3b: Secondary | ICD-10-CM | POA: Diagnosis not present

## 2022-05-25 DIAGNOSIS — N3001 Acute cystitis with hematuria: Secondary | ICD-10-CM | POA: Diagnosis not present

## 2022-05-25 DIAGNOSIS — R531 Weakness: Secondary | ICD-10-CM

## 2022-05-25 DIAGNOSIS — G9341 Metabolic encephalopathy: Secondary | ICD-10-CM

## 2022-05-25 DIAGNOSIS — F0390 Unspecified dementia without behavioral disturbance: Secondary | ICD-10-CM

## 2022-05-25 DIAGNOSIS — R778 Other specified abnormalities of plasma proteins: Secondary | ICD-10-CM

## 2022-05-25 LAB — PROCALCITONIN: Procalcitonin: 1.11 ng/mL

## 2022-05-25 LAB — URINE CULTURE: Culture: >=100000 — AB

## 2022-05-25 NOTE — Assessment & Plan Note (Addendum)
E. coli growing out of culture.  Continue Rocephin.

## 2022-05-25 NOTE — Assessment & Plan Note (Signed)
Secondary to sepsis.  Troponin only mild elevation.

## 2022-05-25 NOTE — Progress Notes (Addendum)
Progress Note   Patient: Yolanda Moore:[id number] DOB: 1946-03-01 DOA: 05/23/2022     2 DOS: the patient was seen and examined on 05/25/2022   Brief hospital course: Ms. 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 Urine study consistent with UTI, blood culture came back with E. coli.  Currently treated with Rocephin.  Assessment and Plan: * Sepsis due to Escherichia coli (E. coli) (HCC) Continue IV Rocephin.  We will get a CT scan of the abdomen to see if there is any other source of E. coli sepsis.  Acute cystitis with hematuria E. coli growing out of culture.  Await sensitivities.  Essential hypertension Continue Coreg  Acute metabolic encephalopathy Secondary to sepsis.  Patient also has underlying dementia.  Improved from admission.  Hyperlipidemia Continue atorvastatin 40 mg nightly resumed  Insomnia Continue trazodone 50 mg nightly as needed for sleep  Elevated troponin Secondary to sepsis.  Troponin only mild elevation.  Weakness Physical therapy recommending home with home health  Dementia without behavioral disturbance (HCC) Continue home donepezil 10 mg nightly  Chronic kidney disease, stage 3b (HCC) With creatinine 1.44 with a GFR of 38        Subjective: Patient admitted with sepsis.  Has E. coli growing in the blood cultures and Enterococcus in the urine culture.  Had some coughing while eating breakfast this morning.  We had to take out her dentures because food was on top of her denture.  Physical Exam: Vitals:   05/25/22 0759 05/25/22 0800 05/25/22 1145 05/25/22 1536  BP: (!) 149/69 (!) 149/69  (!) 153/79  Pulse: 62 62  67  Resp: 17 17  18   Temp: 98 F (36.7 C) 98 F (36.7 C)  98.2 F (36.8 C)  TempSrc: Oral Oral    SpO2:   100%  100%  Weight: 69.9 kg 69.9 kg    Height:  5' (1.524 m)     Physical Exam HENT:     Head: Normocephalic.  Eyes:     General: Lids are normal.     Conjunctiva/sclera: Conjunctivae normal.  Cardiovascular:     Rate and Rhythm: Normal rate and regular rhythm.     Heart sounds: Normal heart sounds, S1 normal and S2 normal.  Pulmonary:     Breath sounds: No decreased breath sounds, wheezing, rhonchi or rales.  Abdominal:     Palpations: Abdomen is soft.     Tenderness: There is no abdominal tenderness.  Musculoskeletal:     Right lower leg: Swelling present.     Left lower leg: Swelling present.  Skin:    General: Skin is warm.     Findings: No rash.  Neurological:     Mental Status: She is alert and oriented to person, place, and time.     Data Reviewed: Creatinine 1.44 with a GFR of 38, white blood cell count 10.8, platelet count 411, hemoglobin 12.5  Family Communication: Spoke with patient's daughter on the phone  Disposition: Status is: Inpatient Remains inpatient appropriate because: Being treated for sepsis.  Awaiting urine culture sensitivities.  We will continue IV antibiotics today.  We will get a CT scan to see if there is a source of E. coli.  Planned Discharge Destination: Home with home health    Time spent: 28 minutes  Author: 04-04-1993, MD 05/25/2022  5:27 PM  For on call review [email or web address].

## 2022-05-25 NOTE — TOC Initial Note (Addendum)
Transition of Care [facility]) - Initial/Assessment Note    Patient Details  Name: Yolanda Moore MRN: [id number] Date of Birth: May 02, 1946  Transition of Care [facility]) CM/SW Contact:    Truddie Hidden, RN Phone Number: 05/25/2022, 1:43 PM  Clinical Narrative:                 Patient admitted for 7/17 for sepsis related to E.coli.  Patient is from home where she is supported by her sisters. Spoke with [employer] by phone. One of patient's two sisters will be available to pick her up at discharge. Patient uses [facility]. Denied any issues with obtaining medications. Patient has a WC. Discussed HH recommendation . Wilma did not voice a preference of HH agencies. Will make [facility] referral  . 1:50pm Referral made and accepted by [place] at [facility].           Patient Goals and CMS Choice        Expected Discharge Plan and Services                                                Prior Living Arrangements/Services                       Activities of Daily Living Home Assistive Devices/Equipment: None ADL Screening (condition at time of admission) Patient's cognitive ability adequate to safely complete daily activities?: Yes Is the patient deaf or have difficulty hearing?: No Does the patient have difficulty seeing, even when wearing glasses/contacts?: No Does the patient have difficulty concentrating, remembering, or making decisions?: Yes Patient able to express need for assistance with ADLs?: Yes Does the patient have difficulty dressing or bathing?: Yes Independently performs ADLs?: No Communication: Independent Dressing (OT): Needs assistance Is this a change from baseline?: Change from baseline, expected to last <3days Grooming: Independent Feeding: Independent Bathing: Needs assistance Is this a change from baseline?: Change from baseline, expected to last <3 days Toileting: Needs assistance Is this a change from baseline?: Change from  baseline, expected to last <3 days In/Out Bed: Needs assistance Is this a change from baseline?: Change from baseline, expected to last <3 days Walks in Home: Needs assistance Is this a change from baseline?: Change from baseline, expected to last <3 days Does the patient have difficulty walking or climbing stairs?: Yes Weakness of Legs: Both Weakness of Arms/Hands: None  Permission Sought/Granted                  Emotional Assessment              Admission diagnosis:  Acute cystitis without hematuria [N30.00] Sepsis secondary to UTI (HCC) [A41.9, N39.0] Altered mental status, unspecified altered mental status type [R41.82] Sepsis without acute organ dysfunction, due to unspecified organism [facility]) [A41.9] Patient Active Problem List   Diagnosis Date Noted   Acute cystitis with hematuria 05/25/2022   Acute metabolic encephalopathy 05/25/2022   Sepsis due to Escherichia coli (E. coli) (HCC) 05/24/2022   Insomnia 05/23/2022   Hyperlipidemia 05/23/2022   Acute kidney injury superimposed on CKD (HCC)    Elevated troponin    Chronic kidney disease, stage 3b (HCC) 09/07/2020   Dementia without behavioral disturbance (HCC) 09/07/2020   NSTEMI (non-ST elevated myocardial infarction) (HCC) 09/07/2020   Weakness 09/07/2020   Essential hypertension 04/23/2018   PCP:  Marisue Ivan, MD Pharmacy:   [facility] [address] ([address]), [place] - [address] [place]) [place] [address] Phone: [phone] Fax: [phone]     Social Determinants of Health (SDOH) Interventions    Readmission Risk Interventions     No data to display

## 2022-05-25 NOTE — Evaluation (Signed)
Occupational Therapy Evaluation Patient Details Name: Yolanda Moore MRN: [id number] DOB: 09/21/1946 Today's Date: 05/25/2022   History of Present Illness Pt is a 76 year old female presenting to the ED concerns of altered mental status and fever due to UTI,Myocardial injury with elevated troponin secondary to sepsis; PMH significant for  hypertension, hyperlipidemia, CKD stage IIIb, CAD, dementia   Clinical Impression   Chart reviewed, RN cleared pt for participation in OT evaluation. Pt is oriented to self and place. Pt appears to be pocketing food with dentures not in place, SLP present to assess after OT evaluation. Pt daughter Yolanda Moore reports pt requires assist for ADLs at baseline and is able to amb household distances around facility with RW. Pt is not at baseline cognitively however does have cognitive impairments at baseline. Pt presents with deficits in strength, endurance affecting safe and optimal ADL completion. Recommend HHOT following discharge.       Recommendations for follow up therapy are one component of a multi-disciplinary discharge planning process, led by the attending physician.  Recommendations may be updated based on patient status, additional functional criteria and insurance authorization.   Follow Up Recommendations  Home health OT    Assistance Recommended at Discharge Frequent or constant Supervision/Assistance  Patient can return home with the following A little help with walking and/or transfers;A little help with bathing/dressing/bathroom;Direct supervision/assist for medications management    Functional Status Assessment  Patient has had a recent decline in their functional status and demonstrates the ability to make significant improvements in function in a reasonable and predictable amount of time.  Equipment Recommendations  None recommended by OT    Recommendations for Other Services       Precautions / Restrictions Precautions Precautions:  Fall Restrictions Weight Bearing Restrictions: No      Mobility Bed Mobility Overal bed mobility: Needs Assistance Bed Mobility: Supine to Sit     Supine to sit: Mod assist, HOB elevated          Transfers   Equipment used: Rolling walker (2 wheels) Transfers: Sit to/from Stand Sit to Stand: Min guard                  Balance Overall balance assessment: Needs assistance Sitting-balance support: Feet supported Sitting balance-Leahy Scale: Good     Standing balance support: Bilateral upper extremity supported Standing balance-Leahy Scale: Fair                             ADL either performed or assessed with clinical judgement   ADL Overall ADL's : Needs assistance/impaired Eating/Feeding: Sitting;Set up Eating/Feeding Details (indicate cue type and reason): pt with noted food in dentures and dentures falling out, this therapist removed and SLP present at end of session to assess; Grooming: Wash/dry hands;Sitting;Set up           Upper Body Dressing : Minimal assistance;Sitting Upper Body Dressing Details (indicate cue type and reason): gown Lower Body Dressing: Minimal assistance Lower Body Dressing Details (indicate cue type and reason): socks bed level Toilet Transfer: Supervision/safety;Min guard Toilet Transfer Details (indicate cue type and reason): simulated to bedside chair with RW Toileting- Clothing Manipulation and Hygiene: Moderate assistance;Sit to/from stand       Functional mobility during ADLs: Supervision/safety;Min guard;Rolling walker (2 wheels) (household distances)       Vision Patient Visual Report: No change from baseline       Perception     Praxis  Pertinent Vitals/Pain Pain Assessment Pain Assessment: No/denies pain     Hand Dominance     Extremity/Trunk Assessment Upper Extremity Assessment Upper Extremity Assessment: Generalized weakness   Lower Extremity Assessment Lower Extremity  Assessment: Generalized weakness   Cervical / Trunk Assessment Cervical / Trunk Assessment: Normal   Communication Communication Communication: No difficulties   Cognition Arousal/Alertness: Awake/alert Behavior During Therapy: WFL for tasks assessed/performed Overall Cognitive Status: Impaired/Different from baseline Area of Impairment: Orientation, Problem solving, Safety/judgement, Awareness, Memory, Following commands                 Orientation Level: Disoriented to, Time, Situation (pt daugther reports pt is frequently not oriented to time)   Memory: Decreased short-term memory (STM deficits at baseline) Following Commands: Follows one step commands with increased time Safety/Judgement: Decreased awareness of safety, Decreased awareness of deficits Awareness: Emergent Problem Solving: Slow processing, Requires verbal cues, Requires tactile cues       General Comments       Exercises Other Exercises Other Exercises: edu re pt and daugther via phone re: discharge recommendations   Shoulder Instructions      Home Living Family/patient expects to be discharged to:: Assisted living ([facility]) Living Arrangements: Alone                           Home Equipment: Agricultural consultant (2 wheels);Shower seat   Additional Comments: history obtainend from [employer] via phone      Prior Functioning/Environment Prior Level of Function : Independent/Modified Independent             Mobility Comments: amb with RW ADLs Comments: Daugther reporst she and her sister assist with all ADLs and IADLs, pt resides at [facility].        OT Problem List: Decreased strength;Decreased activity tolerance;Impaired balance (sitting and/or standing)      OT Treatment/Interventions: Self-care/ADL training;Therapeutic exercise;Patient/family education;DME and/or AE instruction;Therapeutic activities;Energy conservation    OT Goals(Current goals can be found  in the care plan section) Acute Rehab OT Goals Patient Stated Goal: get into chair OT Goal Formulation: With patient Time For Goal Achievement: 06/08/22 Potential to Achieve Goals: Good ADL Goals Pt Will Perform Grooming: with supervision;sitting;standing Pt Will Transfer to Toilet: ambulating;with supervision Pt Will Perform Toileting - Clothing Manipulation and hygiene: with supervision;sit to/from stand  OT Frequency: Min 2X/week    Co-evaluation PT/OT/SLP Co-Evaluation/Treatment: Yes Reason for Co-Treatment: Necessary to address cognition/behavior during functional activity;To address functional/ADL transfers   OT goals addressed during session: ADL's and self-care      AM-PAC OT "6 Clicks" Daily Activity     Outcome Measure Help from another person eating meals?: A Little Help from another person taking care of personal grooming?: A Little Help from another person toileting, which includes using toliet, bedpan, or urinal?: A Lot Help from another person bathing (including washing, rinsing, drying)?: A Little Help from another person to put on and taking off regular upper body clothing?: A Little   6 Click Score: 14   End of Session Equipment Utilized During Treatment: Gait belt;Rolling walker (2 wheels) Nurse Communication: Mobility status  Activity Tolerance: Patient tolerated treatment well Patient left: in chair;with chair alarm set;with call bell/phone within reach  OT Visit Diagnosis: Unsteadiness on feet (R26.81)                Time: 1448-1856 OT Time Calculation (min): 23 min Charges:  OT General Charges $OT Visit: 1  Visit OT Evaluation $OT Eval Low Complexity: 1 Low  Shanon Payor, OTD OTR/L  05/25/22, 12:01 PM

## 2022-05-25 NOTE — Progress Notes (Signed)
Patient's daughter who is POA called this morning and expresses that she does not want any medical or health information given to any other family members, except for her (Yolanda Moore) and [name] (Yolanda's sister)

## 2022-05-25 NOTE — Evaluation (Addendum)
Clinical/Bedside Swallow Evaluation Patient Details  Name: Yolanda Moore MRN: [id number] Date of Birth: [birth year]  Today's Date: 05/25/2022 Time: SLP Start Time (ACUTE ONLY): 1010 SLP Stop Time (ACUTE ONLY): 1035 SLP Time Calculation (min) (ACUTE ONLY): 25 min  Past Medical History:  Past Medical History:  Diagnosis Date   Ankle fracture, right 04/19/2020   Chronic kidney disease    Was told many yrs ago that 1 kidney doesn't work   Dementia (HCC)    HTN (hypertension)    MI (myocardial infarction) (HCC) 09/07/2020   Wears dentures    Partial lower   Past Surgical History:  Past Surgical History:  Procedure Laterality Date   ABDOMINAL HYSTERECTOMY     CATARACT EXTRACTION W/PHACO Right 02/08/2021   Procedure: CATARACT EXTRACTION PHACO AND INTRAOCULAR LENS PLACEMENT (IOC) RIGHT;  Surgeon: Nevada Crane, MD;  Location: [facility];  Service: Ophthalmology;  Laterality: Right;  3.32 0:28.4   CATARACT EXTRACTION W/PHACO Left 02/22/2021   Procedure: CATARACT EXTRACTION PHACO AND INTRAOCULAR LENS PLACEMENT (IOC) LEFT;  Surgeon: Nevada Crane, MD;  Location: [facility];  Service: Ophthalmology;  Laterality: Left;  2.17 0:21.2   HPI:  Yolanda Moore is a 76 year old female with history of hypertension, hyperlipidemia, CKD stage IIIb, CAD, dementia without behavioral disturbances, insomnia, who presents emergency department from home via EMS for chief concerns of altered mental status and fever. Pt currently on regular solids and thin liquids. RN reporting  challenge with eating lunch and dinner yesterday. Pt not previously seen by speech therapy service. CT Head: No acute intracranial abnormality. CXR: No active disease.    Assessment / Plan / Recommendation  Clinical Impression  Pt presents with suspected oropharyngeal dysphagia in the setting of AMS. Nursing reporting  coughing with intake yesterday, leading to evaluation order. Pt seen sitting upright in recliner  with trials from breakfast tray. Single cough noted with large trial of regular solids (self fed). No further s/sx of aspiration noted across trials. Oral phase notable for slowed mastication/A-P transit and oral residue at completion of swallow. Oral phase impacted by cognition/baseline dementia (reduced attention to oral cavity) and dentition (dentures placed with adhesive to secure placement). Verbal cues and assistance with loading spoon provided for compliance with aspiration precautions (especially slow rate and small bites).   Recommend continued regular solids and thin liquids with aspiration precautions (slow rate, small bites, elevated HOB, and alert for PO intake). Tray set up and supervision for meals recommended for safety and monitoring. Ensure proper denture placement prior to intake. RN and MD aware of recommendations. SLP Visit Diagnosis: Dysphagia, oropharyngeal phase (R13.12)    Aspiration Risk  Mild aspiration risk    Diet Recommendation  Regular solids and thin liquids  Medication Administration: Whole meds with liquid (as tolerated)    Other  Recommendations Oral Care Recommendations: Oral care BID    Recommendations for follow up therapy are one component of a multi-disciplinary discharge planning process, led by the attending physician.  Recommendations may be updated based on patient status, additional functional criteria and insurance authorization.  Follow up Recommendations  (to be determined)      Assistance Recommended at Discharge Frequent or constant Supervision/Assistance  Functional Status Assessment Patient has had a recent decline in their functional status and demonstrates the ability to make significant improvements in function in a reasonable and predictable amount of time.  Frequency and Duration min 2x/week  1 week       Prognosis Prognosis for Safe  Diet Advancement: Good Barriers to Reach Goals: Cognitive deficits      Swallow Study   General  Date of Onset: 05/25/22 HPI: Yolanda Moore is a 76 year old female with history of hypertension, hyperlipidemia, CKD stage IIIb, CAD, dementia without behavioral disturbances, insomnia, who presents emergency department from home via EMS for chief concerns of altered mental status and fever. Pt currently on regular solids and thin liquids. RN reporting  challenge with eating lunch and dinner yesterday. Pt not previously seen by speech therapy service. CT Head: No acute intracranial abnormality. CXR: No active disease. Type of Study: Bedside Swallow Evaluation Previous Swallow Assessment: none in chart Diet Prior to this Study: Regular;Thin liquids Temperature Spikes Noted: Yes (WBC 10.8 trending down; temp 98.7 (down from admission)) Respiratory Status: Room air History of Recent Intubation: No Behavior/Cognition: Alert;Cooperative;Confused Oral Cavity Assessment: Within Functional Limits Oral Care Completed by SLP: Other (Comment) (denture care completed) Oral Cavity - Dentition: Dentures, top;Poor condition;Missing dentition (partial nartual dentition on bottom) Vision: Functional for self-feeding Self-Feeding Abilities: Needs set up;Able to feed self Patient Positioning: Upright in chair Baseline Vocal Quality: Normal Volitional Cough: Cognitively unable to elicit Volitional Swallow: Unable to elicit    Oral/Motor/Sensory Function Overall Oral Motor/Sensory Function: Within functional limits   Ice Chips Ice chips: Not tested   Thin Liquid Thin Liquid: Within functional limits Presentation: Straw;Cup    Nectar Thick Nectar Thick Liquid: Not tested   Honey Thick Honey Thick Liquid: Not tested   Puree Puree: Within functional limits   Solid      Yolanda Jolonda Gomm Clapp MS CCC-SLP Solid: Impaired Presentation: Self Fed Oral Phase Functional Implications: Oral residue;Prolonged oral transit;Impaired mastication Pharyngeal Phase Impairments: Cough - Immediate      Yolanda J  Moore 05/25/2022,10:54 AM

## 2022-05-25 NOTE — Assessment & Plan Note (Addendum)
Continue IV Rocephin today and switch over to oral Levaquin for tomorrow.

## 2022-05-25 NOTE — Evaluation (Signed)
Physical Therapy Evaluation Patient Details Name: Yolanda Moore MRN: [id number] DOB: 1946-09-19 Today's Date: 05/25/2022  History of Present Illness  Yolanda Moore is a 76 year old female with history of hypertension, hyperlipidemia, CKD stage IIIb, CAD, dementia without behavioral disturbances, insomnia, who presents emergency department from home via EMS for chief concerns of altered mental status and fever. Sepsis due to UTI.   Clinical Impression  Patient received in bed with OT present. She has dementia and is unable to provide prior living/function information. Patient required mod A for supine to sit, min guard for sit to stand and ambulation with RW 25'. Patient is likely close to baseline mobility. She will continue to benefit from skilled PT to ensure safety and functional independence to return to facility at discharge.          Recommendations for follow up therapy are one component of a multi-disciplinary discharge planning process, led by the attending physician.  Recommendations may be updated based on patient status, additional functional criteria and insurance authorization.  Follow Up Recommendations Home health PT      Assistance Recommended at Discharge Intermittent Supervision/Assistance  Patient can return home with the following  A little help with walking and/or transfers;A little help with bathing/dressing/bathroom;Assist for transportation;Help with stairs or ramp for entrance;Assistance with cooking/housework    Equipment Recommendations None recommended by PT  Recommendations for Other Services       Functional Status Assessment Patient has had a recent decline in their functional status and demonstrates the ability to make significant improvements in function in a reasonable and predictable amount of time.     Precautions / Restrictions Precautions Precautions: Fall Restrictions Weight Bearing Restrictions: No      Mobility  Bed Mobility Overal bed  mobility: Needs Assistance Bed Mobility: Supine to Sit     Supine to sit: Mod assist, HOB elevated          Transfers Overall transfer level: Needs assistance Equipment used: Rolling walker (2 wheels) Transfers: Sit to/from Stand Sit to Stand: Min guard                Ambulation/Gait Ambulation/Gait assistance: Min guard Gait Distance (Feet): 25 Feet Assistive device: Rolling walker (2 wheels) Gait Pattern/deviations: Step-through pattern Gait velocity: WNL     General Gait Details: patient ambulated well with RW, min guard.  Stairs            Wheelchair Mobility    Modified Rankin (Stroke Patients Only)       Balance Overall balance assessment: Modified Independent                                           Pertinent Vitals/Pain Pain Assessment Pain Assessment: No/denies pain    Home Living Family/patient expects to be discharged to:: Assisted living                 Home Equipment: Agricultural consultant (2 wheels)      Prior Function Prior Level of Function : Independent/Modified Independent             Mobility Comments: ambulates with RW at baseline ADLs Comments: daughter assists her daily, she lives in facility. [facility] per chart. Patient unable to provide accurate info, no family present     Hand Dominance        Extremity/Trunk Assessment   Upper Extremity Assessment Upper  Extremity Assessment: Defer to OT evaluation    Lower Extremity Assessment Lower Extremity Assessment: Overall WFL for tasks assessed    Cervical / Trunk Assessment Cervical / Trunk Assessment: Normal  Communication   Communication: No difficulties  Cognition Arousal/Alertness: Awake/alert Behavior During Therapy: WFL for tasks assessed/performed Overall Cognitive Status: History of cognitive impairments - at baseline                                          General Comments      Exercises      Assessment/Plan    PT Assessment Patient needs continued PT services  PT Problem List Decreased strength;Decreased mobility;Decreased cognition;Decreased safety awareness       PT Treatment Interventions DME instruction;Therapeutic exercise;Gait training;Functional mobility training;Therapeutic activities;Patient/family education    PT Goals (Current goals can be found in the Care Plan section)  Acute Rehab PT Goals Patient Stated Goal: none stated PT Goal Formulation: Patient unable to participate in goal setting Time For Goal Achievement: 06/08/22    Frequency Min 2X/week     Co-evaluation               AM-PAC PT "6 Clicks" Mobility  Outcome Measure Help needed turning from your back to your side while in a flat bed without using bedrails?: A Little Help needed moving from lying on your back to sitting on the side of a flat bed without using bedrails?: A Lot Help needed moving to and from a bed to a chair (including a wheelchair)?: A Little Help needed standing up from a chair using your arms (e.g., wheelchair or bedside chair)?: A Little Help needed to walk in hospital room?: A Little Help needed climbing 3-5 steps with a railing? : A Little 6 Click Score: 17    End of Session Equipment Utilized During Treatment: Gait belt Activity Tolerance: Patient tolerated treatment well Patient left: in chair;with chair alarm set Nurse Communication: Mobility status PT Visit Diagnosis: Other abnormalities of gait and mobility (R26.89);Difficulty in walking, not elsewhere classified (R26.2)    Time: 1000-1015 PT Time Calculation (min) (ACUTE ONLY): 15 min   Charges:   PT Evaluation $PT Eval Low Complexity: 1 Low          Giavonni Cizek, PT, GCS 05/25/22,10:27 AM

## 2022-05-25 NOTE — Assessment & Plan Note (Signed)
Secondary to sepsis.  Patient also has underlying dementia.  Improved from admission.

## 2022-05-25 NOTE — Assessment & Plan Note (Addendum)
Physical therapy recommending home with home health.  Repeat CT scan does not show any stroke.

## 2022-05-26 DIAGNOSIS — N189 Chronic kidney disease, unspecified: Secondary | ICD-10-CM

## 2022-05-26 DIAGNOSIS — A4151 Sepsis due to Escherichia coli [E. coli]: Secondary | ICD-10-CM | POA: Diagnosis not present

## 2022-05-26 DIAGNOSIS — N179 Acute kidney failure, unspecified: Secondary | ICD-10-CM | POA: Diagnosis not present

## 2022-05-26 DIAGNOSIS — G9341 Metabolic encephalopathy: Secondary | ICD-10-CM

## 2022-05-26 DIAGNOSIS — N3001 Acute cystitis with hematuria: Secondary | ICD-10-CM | POA: Diagnosis not present

## 2022-05-26 DIAGNOSIS — R6520 Severe sepsis without septic shock: Secondary | ICD-10-CM

## 2022-05-26 LAB — CULTURE, BLOOD (ROUTINE X 2): Special Requests: ADEQUATE

## 2022-05-26 LAB — BASIC METABOLIC PANEL
Anion gap: 3 — ABNORMAL LOW (ref 5–15)
BUN: 20 mg/dL (ref 8–23)
CO2: 27 mmol/L (ref 22–32)
Calcium: 9.2 mg/dL (ref 8.9–10.3)
Chloride: 109 mmol/L (ref 98–111)
Creatinine, Ser: 1.17 mg/dL — ABNORMAL HIGH (ref 0.44–1.00)
GFR, Estimated: 49 mL/min — ABNORMAL LOW (ref >60)
Glucose, Bld: 109 mg/dL — ABNORMAL HIGH (ref 70–99)
Potassium: 4 mmol/L (ref 3.5–5.1)
Sodium: 139 mmol/L (ref 135–145)

## 2022-05-26 LAB — CBC
HCT: 41.5 % (ref 36.0–46.0)
Hemoglobin: 12.8 g/dL (ref 12.0–15.0)
MCH: 27.1 pg (ref 26.0–34.0)
MCHC: 30.8 g/dL (ref 30.0–36.0)
MCV: 87.9 fL (ref 80.0–100.0)
Platelets: 523 10*3/uL — ABNORMAL HIGH (ref 150–400)
RBC: 4.72 MIL/uL (ref 3.87–5.11)
RDW: 14.4 % (ref 11.5–15.5)
WBC: 7.6 10*3/uL (ref 4.0–10.5)
nRBC: 0 % (ref 0.0–0.2)

## 2022-05-26 MED ORDER — ENOXAPARIN SODIUM 40 MG/0.4ML IJ SOSY
40.0000 mg | PREFILLED_SYRINGE | Freq: Every day | INTRAMUSCULAR | Status: DC
  Administered 2022-05-26 – 2022-05-27 (×2): 40 mg via SUBCUTANEOUS
  Filled 2022-05-26 (×2): qty 0.4

## 2022-05-26 NOTE — Progress Notes (Signed)
Progress Note   Patient: Yolanda Moore DOB: 02-Jan-1946 DOA: 05/23/2022     3 DOS: the patient was seen and examined on 05/26/2022   Brief hospital course: Ms. 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 Urine study consistent with UTI, blood culture came back with E. coli.  Currently treated with Rocephin.  Assessment and Plan: * Sepsis due to Escherichia coli (E. coli) (HCC) Continue IV Rocephin.  CT scan results noted and case discussed with urology yesterday and just recommended antibiotics and nothing further to do.  Acute cystitis with hematuria E. coli growing out of culture.  Continue Rocephin.  Acute kidney injury superimposed on chronic kidney disease (HCC) Underlying chronic kidney disease stage IIIb.  Creatinine 1.62 on presentation and came down to 1.17.  Essential hypertension Continue Coreg  Acute metabolic encephalopathy Secondary to sepsis.  Patient also has underlying dementia.  Improved from admission.  Hyperlipidemia Continue atorvastatin 40 mg nightly resumed  Insomnia Continue trazodone 50 mg nightly as needed for sleep  Elevated troponin Secondary to sepsis.  Troponin only mild elevation.  Weakness Physical therapy recommending home with home health  Dementia without behavioral disturbance (HCC) Continue home donepezil 10 mg nightly        Subjective: Patient's daughter came in to visit this afternoon and states that she did not look good today.  I saw the patient this morning and the patient was more coordinated with eating today.  The patient stated she felt good and offers no complaints.  The patient only gives short answers.  I will set up a family meeting with the daughter tomorrow morning.  Physical Exam: Vitals:    05/26/22 0315 05/26/22 0600 05/26/22 0750 05/26/22 1125  BP: (!) 151/61  (!) 149/67 (!) 140/57  Pulse: 64  65 77  Resp:  (!) 21 16   Temp: 98.5 F (36.9 C)  97.7 F (36.5 C) 98.5 F (36.9 C)  TempSrc: Oral  Oral Oral  SpO2: 100%  100% 100%  Weight:      Height:       Physical Exam HENT:     Head: Normocephalic.  Eyes:     General: Lids are normal.     Conjunctiva/sclera: Conjunctivae normal.  Cardiovascular:     Rate and Rhythm: Normal rate and regular rhythm.     Heart sounds: Normal heart sounds, S1 normal and S2 normal.  Pulmonary:     Breath sounds: No decreased breath sounds, wheezing, rhonchi or rales.  Abdominal:     Palpations: Abdomen is soft.     Tenderness: There is no abdominal tenderness.  Musculoskeletal:     Right lower leg: Swelling present.     Left lower leg: Swelling present.  Skin:    General: Skin is warm.     Findings: No rash.  Neurological:     Mental Status: She is alert.     Comments: Answers some simple questions     Data Reviewed: Creatinine 1.17, GFR 49, white blood cell count 7.6, hemoglobin 12.8, platelet count 523  Family Communication: Spoke with the patient's daughter on the phone this morning and again this afternoon.  We will set up a family meeting at the bedside tomorrow morning  Disposition: Status is: Inpatient Remains inpatient appropriate because: Patient's daughter states that she does not look good.  I will meet the patient's daughter at the bedside tomorrow morning.  Continue IV antibiotics today for sepsis. Planned Discharge Destination: Home with Home Health    Time spent: 28 minutes  Author: Alford Highland, MD 05/26/2022 3:25 PM  For on call review [email or web address].

## 2022-05-26 NOTE — Care Management Important Message (Signed)
Important Message  Patient Details  Name: Yolanda Moore MRN: [id number] Date of Birth: Aug 16, 1946   Medicare Important Message Given:  Yes  Reviewed Medicare IM with Guido Sander, daughter, at [id number].  Copy of Medicare IM left in patient's room for reference.   Johnell Comings 05/26/2022, 2:00 PM

## 2022-05-26 NOTE — Assessment & Plan Note (Signed)
Underlying chronic kidney disease stage IIIb.  Creatinine 1.62 on presentation and came down to 1.17.

## 2022-05-26 NOTE — Progress Notes (Addendum)
Speech Language Pathology Treatment:    Patient Details Name: Yolanda Moore MRN: [id number] DOB: 11/30/1945 Today's Date: 05/26/2022 Time: 0845-0900 SLP Time Calculation (min) (ACUTE ONLY): 15 min  Assessment / Plan / Recommendation Clinical Impression  Pt seen for diet tolerance. Pt alert and pleasantly confused. Denture donned for oral intake.   Per chart review, temp and WBC WNL. No updated chest imaging since admission.   Pt observed with soft baked cookie and water (via cup and straw) from bedside table. Pt presents with s/sx mild oral dysphagia c/b prolonged mastication and mild oral residual/pocketing on R with solid. Pt benefited from cued liquid wash to clear oral cavity. Pt tending to "overstuff" oral cavity with solids when not cued for small bites. Oral deficits likely due to dental status and exacerbated by AMS. Pt without any observed overt s/sx pharyngeal dysphagia noted with POs this date.   Recommend continuation of current diet with safe swallowing strategies/aspiration precautions as outlined below.   Pt remains at increased risk for aspiration/aspiration PNA given dental status, mental status/cognitive decline in setting of dementia, and medical comorbidities. Risk is likely reduced with diet modifications and safe swallowing strategies/aspiration precautions.   Pt and RN made aware of results, diet recommendations, safe swallowing strategies/aspiration precautions, and SLP POC. Likely reduced understanding by pt given AMS. White board in pt's room updated for reinforcement of content by medical team.  SLP to sign off as pt has no acute SLP needs. Recommend consideration for [facility] SLP services for ongoing dysphagia management and cognitive-linguistic evaluation in home/functional setting given AMS.     HPI HPI: Ms. Yolanda Moore is a 76 year old female with history of hypertension, hyperlipidemia, CKD stage IIIb, CAD, dementia without behavioral disturbances, insomnia, who presents  emergency department from home via EMS for chief concerns of altered mental status and fever. Pt currently on regular solids and thin liquids. RN reporting  challenge with eating lunch and dinner yesterday. Pt not previously seen by speech therapy service. CT Head, 05/23/22: No acute intracranial abnormality. CXR, 05/23/22: No active disease.      SLP Plan  All goals met      Recommendations for follow up therapy are one component of a multi-disciplinary discharge planning process, led by the attending physician.  Recommendations may be updated based on patient status, additional functional criteria and insurance authorization.    Recommendations  Diet recommendations: Regular;Thin liquid (chopped solids) Medication Administration: Whole meds with liquid (as tolerated) Supervision: Patient able to self feed;Full supervision/cueing for compensatory strategies (set up assistance) Compensations: Minimize environmental distractions;Slow rate;Small sips/bites;Follow solids with liquid Postural Changes and/or Swallow Maneuvers: Out of bed for meals;Seated upright 90 degrees;Upright 30-60 min after meal                Oral Care Recommendations: Oral care BID;Staff/trained caregiver to provide oral care Follow Up Recommendations: Home health SLP (for cognitive-lingusitic evaluation in home/functional setting given AMS as well as dysphagia management) Assistance recommended at discharge: Frequent or constant Supervision/Assistance SLP Visit Diagnosis: Dysphagia, oropharyngeal phase (R13.12) Plan: All goals met          Cherrie Gauze, M.S., [facility] [phone] ([place])  Quintella Baton  05/26/2022, 10:10 AM

## 2022-05-26 NOTE — Progress Notes (Signed)
PT Cancellation Note  Patient Details Name: Yolanda Moore MRN: [id number] DOB: 18-Nov-1945   Cancelled Treatment:     PT attempt. Pt reports she just returned to bed requesting author return tomorrow. PT will return tomorrow and continue to follow per current POC.    Rushie Chestnut 05/26/2022, 4:06 PM

## 2022-05-26 NOTE — Progress Notes (Signed)
PHARMACIST - PHYSICIAN COMMUNICATION  CONCERNING:  Enoxaparin (Lovenox) for DVT Prophylaxis    RECOMMENDATION: Patient was prescribed enoxaparin 30mg  q24 hours for VTE prophylaxis.   Filed Weights   05/23/22 1247 05/25/22 0759 05/25/22 0800  Weight: 69.9 kg (154 lb 1.6 oz) 69.9 kg (154 lb 1.6 oz) 69.9 kg (154 lb 1.6 oz)    Body mass index is 30.1 kg/m.  Estimated Creatinine Clearance: 36.3 mL/min (A) (by C-G formula based on SCr of 1.17 mg/dL (H)).  Patient is candidate for enoxaparin 40mg  every 24 hours based on CrCl >23ml/min and Weight >45kg  DESCRIPTION: Pharmacy has adjusted enoxaparin dose per [facility] policy.  Patient is now receiving enoxaparin 40 mg every 24 hours   31m 05/26/2022 2:44 PM

## 2022-05-27 ENCOUNTER — Inpatient Hospital Stay: Payer: Medicare PPO

## 2022-05-27 DIAGNOSIS — N179 Acute kidney failure, unspecified: Secondary | ICD-10-CM | POA: Diagnosis not present

## 2022-05-27 DIAGNOSIS — N3001 Acute cystitis with hematuria: Secondary | ICD-10-CM | POA: Diagnosis not present

## 2022-05-27 DIAGNOSIS — I10 Essential (primary) hypertension: Secondary | ICD-10-CM | POA: Diagnosis not present

## 2022-05-27 DIAGNOSIS — A4151 Sepsis due to Escherichia coli [E. coli]: Secondary | ICD-10-CM | POA: Diagnosis not present

## 2022-05-27 MED ORDER — ORAL CARE MOUTH RINSE: 15.0000 mL | OROMUCOSAL | Status: DC | PRN

## 2022-05-27 MED ORDER — CEPHALEXIN 500 MG PO CAPS: 1000.0000 mg | ORAL_CAPSULE | Freq: Three times a day (TID) | ORAL | Status: DC

## 2022-05-27 MED ORDER — LEVOFLOXACIN 500 MG PO TABS
500.0000 mg | ORAL_TABLET | Freq: Every day | ORAL | Status: DC
Start: 2022-05-28 — End: 2022-05-28
  Administered 2022-05-28: 500 mg via ORAL
  Filled 2022-05-27: qty 1

## 2022-05-27 NOTE — Progress Notes (Signed)
PT Cancellation Note  Patient Details Name: Yolanda Moore MRN: [id number] DOB: 16-Dec-1945   Cancelled Treatment:     PT attempt. Pt had recently finished working with OT and is currently lethargic in recliner. Requested Thereasa Parkin return later. PT will continue efforts to progress pt per current POC.     Rushie Chestnut 05/27/2022, 3:11 PM

## 2022-05-27 NOTE — Progress Notes (Signed)
Occupational Therapy Treatment Patient Details Name: Yolanda Moore MRN: [id number] DOB: Mar 11, 1946 Today's Date: 05/27/2022   History of present illness Pt is a 76 year old female presenting to the ED concerns of altered mental status and fever due to UTI,Myocardial injury with elevated troponin secondary to sepsis; PMH significant for  hypertension, hyperlipidemia, CKD stage IIIb, CAD, dementia   OT comments  Chart reviewed, pt greeted in bed, soiled. Pt required MAX A for peri care with MIN A for STS with RW. Pt appears with increased confusion on this date as compared to evaluation. Please see below. Pt required CGA-MIN A for short ambulatory transfer to bedside chair, two posterior LOB with correction provided by this therapist. Pt is left in bedside chair, NAD, all needs met. OT will continue to follow acutely.    Recommendations for follow up therapy are one component of a multi-disciplinary discharge planning process, led by the attending physician.  Recommendations may be updated based on patient status, additional functional criteria and insurance authorization.    Follow Up Recommendations  Home health OT    Assistance Recommended at Discharge Frequent or constant Supervision/Assistance  Patient can return home with the following  A little help with walking and/or transfers;A little help with bathing/dressing/bathroom;Direct supervision/assist for medications management   Equipment Recommendations  None recommended by OT    Recommendations for Other Services      Precautions / Restrictions Precautions Precautions: Fall Restrictions Weight Bearing Restrictions: No       Mobility Bed Mobility Overal bed mobility: Needs Assistance Bed Mobility: Supine to Sit     Supine to sit: Mod assist, HOB elevated     General bed mobility comments: step by step vcs    Transfers Overall transfer level: Needs assistance Equipment used: Rolling walker (2 wheels) Transfers: Sit  to/from Stand Sit to Stand: Min assist           General transfer comment: step by step vcs     Balance Overall balance assessment: Needs assistance Sitting-balance support: Feet supported Sitting balance-Leahy Scale: Fair     Standing balance support: Bilateral upper extremity supported Standing balance-Leahy Scale: Poor Standing balance comment: pt with two posterior LOB prior to tranfserrign into bedside chair, required therapist MIN A to correct                           ADL either performed or assessed with clinical judgement   ADL Overall ADL's : Needs assistance/impaired                         Toilet Transfer: Min guard;Minimal assistance;Cueing for safety;Cueing for sequencing Toilet Transfer Details (indicate cue type and reason): simulated to bedside chair with RW Toileting- Clothing Manipulation and Hygiene: Maximal assistance;Sit to/from stand              Extremity/Trunk Assessment              Vision       Perception     Praxis      Cognition Arousal/Alertness: Awake/alert Behavior During Therapy: Flat affect Overall Cognitive Status: Impaired/Different from baseline Area of Impairment: Orientation, Problem solving, Safety/judgement, Awareness, Memory, Following commands                 Orientation Level: Disoriented to, Place, Time, Situation   Memory: Decreased short-term memory Following Commands: Follows one step commands with increased time Safety/Judgement: Decreased awareness of  safety, Decreased awareness of deficits   Problem Solving: Slow processing, Requires verbal cues, Requires tactile cues General Comments: Pt appears with increased confusion as compared to evaluation, RN notified        Exercises      Shoulder Instructions       General Comments      Pertinent Vitals/ Pain       Pain Assessment Pain Assessment: No/denies pain  Home Living                                           Prior Functioning/Environment              Frequency  Min 2X/week        Progress Toward Goals  OT Goals(current goals can now be found in the care plan section)  Progress towards OT goals: Progressing toward goals     Plan Discharge plan remains appropriate    Co-evaluation                 AM-PAC OT "6 Clicks" Daily Activity     Outcome Measure   Help from another person eating meals?: A Little Help from another person taking care of personal grooming?: A Little Help from another person toileting, which includes using toliet, bedpan, or urinal?: A Lot Help from another person bathing (including washing, rinsing, drying)?: A Little Help from another person to put on and taking off regular upper body clothing?: A Little Help from another person to put on and taking off regular lower body clothing?: A Lot 6 Click Score: 16    End of Session Equipment Utilized During Treatment: Rolling walker (2 wheels)  OT Visit Diagnosis: Unsteadiness on feet (R26.81)   Activity Tolerance Patient tolerated treatment well   Patient Left in chair;with chair alarm set;with call bell/phone within reach   Nurse Communication Mobility status        Time: 6742-5525 OT Time Calculation (min): 14 min  Charges: OT General Charges $OT Visit: 1 Visit OT Treatments $Self Care/Home Management : 8-22 mins  Shanon Payor, OTD OTR/L  05/27/22, 3:46 PM

## 2022-05-27 NOTE — Progress Notes (Signed)
  Progress Note   Patient: Yolanda Moore UMP:[id number] DOB: 05/08/1946 DOA: 05/23/2022     4 DOS: the patient was seen and examined on 05/27/2022    Assessment and Plan: * Sepsis due to Escherichia coli (E. coli) (HCC) Continue IV Rocephin today and switch over to oral Levaquin for tomorrow.  Acute cystitis with hematuria E. coli growing out of culture.  Continue Rocephin.  Acute kidney injury superimposed on chronic kidney disease (HCC) Underlying chronic kidney disease stage IIIb.  Creatinine 1.62 on presentation and came down to 1.17.  Essential hypertension Continue Coreg  Acute metabolic encephalopathy Secondary to sepsis.  Patient also has underlying dementia.  Improved from admission.  Hyperlipidemia Continue atorvastatin 40 mg nightly resumed  Insomnia Continue trazodone 50 mg nightly as needed for sleep  Elevated troponin Secondary to sepsis.  Troponin only mild elevation.  Weakness Physical therapy recommending home with home health.  Repeat CT scan does not show any stroke.  Dementia without behavioral disturbance (HCC) Continue home donepezil 10 mg nightly        Subjective: Patient feels okay.  Offers no complaints but does not elaborate much.  Physical Exam: Vitals:   05/27/22 0458 05/27/22 0500 05/27/22 0747 05/27/22 1131  BP: (!) 166/74  (!) 144/78 135/63  Pulse: 65  65 64  Resp:    16  Temp: (!) 97.4 F (36.3 C) 97.6 F (36.4 C) 97.7 F (36.5 C) (!) 97.4 F (36.3 C)  TempSrc: Oral  Oral Oral  SpO2: 100%  100% 100%  Weight:      Height:       Physical Exam HENT:     Head: Normocephalic.  Eyes:     General: Lids are normal.     Conjunctiva/sclera: Conjunctivae normal.  Cardiovascular:     Rate and Rhythm: Normal rate and regular rhythm.     Heart sounds: Normal heart sounds, S1 normal and S2 normal.  Pulmonary:     Breath sounds: No decreased breath sounds, wheezing, rhonchi or rales.  Abdominal:     Palpations: Abdomen is soft.      Tenderness: There is no abdominal tenderness.  Musculoskeletal:     Right lower leg: Swelling present.     Left lower leg: Swelling present.  Skin:    General: Skin is warm.     Findings: No rash.  Neurological:     Mental Status: She is alert.     Comments: Answers some simple questions.  Power 4+ out of 5 bilateral upper and lower extremities.  Able to feed herself with both arms.  Does have a slight tremor when trying to feed herself.     Data Reviewed: Last creatinine 1.17  Family Communication: Spoke with patient's daughter at the bedside and another daughter on the phone  Disposition: Status is: Inpatient Remains inpatient appropriate because: Family was concerned that she may have had a stroke.  A repeat CT scan today was negative.  Long discussion with the family that I can treat sepsis as outpatient with antibiotics to complete the course.  They are agreeable to take the patient home tomorrow. Planned Discharge Destination: Home with Home Health    Time spent: 28 minutes  Author: Alford Highland, MD 05/27/2022 2:27 PM  For on call review [email or web address].

## 2022-05-28 DIAGNOSIS — A4151 Sepsis due to Escherichia coli [E. coli]: Secondary | ICD-10-CM | POA: Diagnosis not present

## 2022-05-28 DIAGNOSIS — I10 Essential (primary) hypertension: Secondary | ICD-10-CM | POA: Diagnosis not present

## 2022-05-28 DIAGNOSIS — N3001 Acute cystitis with hematuria: Secondary | ICD-10-CM | POA: Diagnosis not present

## 2022-05-28 DIAGNOSIS — N179 Acute kidney failure, unspecified: Secondary | ICD-10-CM | POA: Diagnosis not present

## 2022-05-28 LAB — CULTURE, BLOOD (ROUTINE X 2)
Culture: NO GROWTH
Special Requests: ADEQUATE

## 2022-05-28 LAB — CBC
HCT: 39.6 % (ref 36.0–46.0)
Hemoglobin: 12.3 g/dL (ref 12.0–15.0)
MCH: 26.9 pg (ref 26.0–34.0)
MCHC: 31.1 g/dL (ref 30.0–36.0)
MCV: 86.5 fL (ref 80.0–100.0)
Platelets: 542 10*3/uL — ABNORMAL HIGH (ref 150–400)
RBC: 4.58 MIL/uL (ref 3.87–5.11)
RDW: 14.3 % (ref 11.5–15.5)
WBC: 9.6 10*3/uL (ref 4.0–10.5)
nRBC: 0 % (ref 0.0–0.2)

## 2022-05-28 LAB — BASIC METABOLIC PANEL
Anion gap: 7 (ref 5–15)
BUN: 16 mg/dL (ref 8–23)
CO2: 27 mmol/L (ref 22–32)
Calcium: 9.3 mg/dL (ref 8.9–10.3)
Chloride: 106 mmol/L (ref 98–111)
Creatinine, Ser: 1.18 mg/dL — ABNORMAL HIGH (ref 0.44–1.00)
GFR, Estimated: 48 mL/min — ABNORMAL LOW (ref >60)
Glucose, Bld: 106 mg/dL — ABNORMAL HIGH (ref 70–99)
Potassium: 4.3 mmol/L (ref 3.5–5.1)
Sodium: 140 mmol/L (ref 135–145)

## 2022-05-28 MED ORDER — LEVOFLOXACIN 500 MG PO TABS: 500.0000 mg | ORAL_TABLET | Freq: Every day | ORAL | 0 refills | Status: AC

## 2022-05-28 NOTE — Discharge Summary (Signed)
Physician Discharge Summary   Patient: Yolanda Moore MRN: [id number] DOB: 1945-12-12  Admit date:     05/23/2022  Discharge date: 05/28/22  Discharge Physician: Loletha Grayer   PCP: Dion Body, MD   Recommendations at discharge:   Follow-up PCP 5 days  Discharge Diagnoses: Principal Problem:   Sepsis due to Escherichia coli (E. coli) (HCC) Active Problems:   Acute cystitis with hematuria   Acute kidney injury superimposed on chronic kidney disease ([place])   Essential hypertension   Chronic kidney disease, stage 3b (HCC)   Dementia without behavioral disturbance (HCC)   Weakness   Elevated troponin   Insomnia   Hyperlipidemia   Acute metabolic encephalopathy    Hospital Course: Ms. 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 Urine study consistent with UTI.  Blood and urine culture positive for E. coli.  Patient was given IV Rocephin in the hospital.  Given Levaquin on the day of discharge and postdischarge to treat infection.  Patient had improved during the hospital course.  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  Case discussed with urology and they recommended antibiotic treatment.  Assessment and Plan: * Sepsis due to Escherichia coli (E. coli) ([place]) The patient was given IV Rocephin in the hospital and switched over to oral Levaquin on 05/28/2022 prior to discharge.  Will complete oral Levaquin course upon discharge.  Acute cystitis with hematuria E. coli growing out of culture.  Treated with Rocephin and will treat with Levaquin to complete course.  Acute kidney injury superimposed on chronic kidney disease  ([place]) Underlying chronic kidney disease stage IIIb.  Creatinine 1.62 on presentation and came down to 1.18 Upon discharge.  Essential hypertension Continue Coreg  Acute metabolic encephalopathy Secondary to sepsis.  Patient also has underlying dementia.  Improved from admission.  Hyperlipidemia Continue atorvastatin 40 mg nightly resumed  Insomnia Continue trazodone 50 mg nightly as needed for sleep  Elevated troponin Secondary to sepsis.  Troponin only mild elevation.  Weakness Physical therapy recommending home with home health.  Repeat CT scan of the head on 05/27/2022 does not show any signs of stroke.  Seen by PT and OT during the hospital course and recommended home health PT and OT.  Dementia without behavioral disturbance ([place]) Continue home donepezil 10 mg nightly         Consultants: None Procedures performed: None Disposition: Home health Diet recommendation:  Cardiac diet DISCHARGE MEDICATION: Allergies as of 05/28/2022   No Known Allergies      Medication List     STOP taking these medications    cephALEXin 500 MG capsule Commonly known as: KEFLEX   losartan 100 MG tablet Commonly known as: COZAAR   ondansetron 4 MG disintegrating tablet Commonly known as: ZOFRAN-ODT       TAKE these medications    aspirin EC 81 MG tablet Take 81 mg by mouth daily.   atorvastatin 40 MG tablet Commonly known as: LIPITOR Take 1 tablet (40 mg total) by mouth daily.   carvedilol 6.25 MG tablet Commonly known as: COREG Take 1 tablet (6.25 mg total) by mouth 2 (two) times daily with a meal.   donepezil 10 MG tablet Commonly known as: ARICEPT Take  10 mg by mouth at bedtime.   levofloxacin 500 MG tablet Commonly known as: LEVAQUIN Take 1 tablet (500 mg total) by mouth daily for 5 days.   nitroGLYCERIN 0.4 MG SL tablet Commonly known as: NITROSTAT Place 0.4 mg under the tongue every 5 (five) minutes as needed for chest pain.   traZODone 50 MG  tablet Commonly known as: DESYREL Take 50 mg by mouth at bedtime as needed.        Follow-up Information     Marisue Ivan, MD Follow up in 5 day(s).   Specialty: Family Medicine Contact information: [address] [facility] [place] [address] [phone]                Discharge Exam: Ceasar Mons Weights   05/23/22 1247 05/25/22 0759 05/25/22 0800  Weight: 69.9 kg 69.9 kg 69.9 kg   Physical Exam HENT:     Head: Normocephalic.  Eyes:     General: Lids are normal.     Conjunctiva/sclera: Conjunctivae normal.  Cardiovascular:     Rate and Rhythm: Normal rate and regular rhythm.     Heart sounds: Normal heart sounds, S1 normal and S2 normal.  Pulmonary:     Breath sounds: No decreased breath sounds, wheezing, rhonchi or rales.  Abdominal:     Palpations: Abdomen is soft.     Tenderness: There is no abdominal tenderness.  Musculoskeletal:     Right lower leg: Swelling present.     Left lower leg: Swelling present.  Skin:    General: Skin is warm.     Findings: No rash.  Neurological:     Mental Status: She is alert.     Comments: Answers some simple questions.  Power 4+ out of 5 bilateral upper and lower extremities.  Able to feed herself with both arms.  Does have a slight tremor when trying to feed herself.  Patient able to straight leg raise bilaterally.      Condition at discharge: stable  The results of significant diagnostics from this hospitalization (including imaging, microbiology, ancillary and laboratory) are listed below for reference.   Imaging Studies: CT HEAD WO CONTRAST ( )  Result Date: 05/27/2022 CLINICAL DATA:  Behavioral disturbances, insomnia and altered mental status. Fever. EXAM: CT HEAD WITHOUT CONTRAST TECHNIQUE: Contiguous axial images were obtained from the base of the skull through the vertex without intravenous contrast. RADIATION DOSE REDUCTION: This exam was performed according to the departmental  dose-optimization program which includes automated exposure control, adjustment of the mA and/or kV according to patient size and/or use of iterative reconstruction technique. COMPARISON:  CT head 05/23/2022 FINDINGS: Brain: There is no evidence of acute intracranial hemorrhage, extra-axial fluid collection, or acute infarct. Global parenchymal volume loss with prominence of the ventricular system and extra-axial CSF spaces is unchanged. The ventricles are stable in size, with unchanged ex vacuo dilatation of the left temporal horn and adjacent encephalomalacia in the temporal lobe. Confluent hypodensity in the subcortical and periventricular white matter likely reflects advanced chronic white matter microangiopathy, unchanged. There is no mass lesion.  There is no mass effect or midline shift. Vascular: No hyperdense vessel or unexpected calcification. Skull: Normal. Negative for fracture or focal lesion. Sinuses/Orbits: The imaged paranasal sinuses are clear. The imaged globes and orbits are unremarkable. Other: None. IMPRESSION: Stable noncontrast head CT with no acute intracranial pathology. Electronically Signed   By: Lesia Hausen M.D.   On: 05/27/2022 11:05   CT RENAL STONE STUDY  Result Date:  05/25/2022 CLINICAL DATA:  76 year old female presents with history of sepsis and UTI. EXAM: CT ABDOMEN AND PELVIS WITHOUT CONTRAST TECHNIQUE: Multidetector CT imaging of the abdomen and pelvis was performed following the standard protocol without IV contrast. RADIATION DOSE REDUCTION: This exam was performed according to the departmental dose-optimization program which includes automated exposure control, adjustment of the mA and/or kV according to patient size and/or use of iterative reconstruction technique. COMPARISON:  None available FINDINGS: Lower chest: Minimal basilar atelectasis without effusion or consolidative changes. Hepatobiliary: Smooth hepatic contours. No visible lesion on noncontrast imaging. No  pericholecystic stranding or biliary duct dilation. Pancreas: Pancreas with normal contours. No signs of adjacent fluid collection or stranding centered about the pancreas to suggest acute inflammation. Spleen: Normal. Adrenals/Urinary Tract: Adrenal glands are normal. Moderate to marked bilateral perinephric stranding. No substantial ureteral dilation. Urinary bladder is nondistended with mild perivesical stranding. No nephrolithiasis.  No ureteral calculi. 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 These areas are not fully evaluated on noncontrast imaging. Again the ureters are nondilated. Stomach/Bowel: Normal appendix. No acute gastrointestinal findings. Vascular/Lymphatic: Aortic atherosclerosis. No sign of aneurysm. Smooth contour of the IVC. There is no gastrohepatic or hepatoduodenal ligament lymphadenopathy. No retroperitoneal or mesenteric lymphadenopathy. No pelvic sidewall lymphadenopathy. Limited assessment of vascular structures due to lack of intravenous contrast. Reproductive: Unremarkable to the extent evaluated on noncontrast CT post hysterectomy. Other: No ascites.  No pneumoperitoneum. Musculoskeletal: No acute bone finding. No destructive bone process. Spinal degenerative changes. Compression fracture with approximately 50-60 loss of height at the L2 level with mild to moderate ventral canal encroachment due to posterior element retropulsion appears recent and is definitely new since 2021, the last comparison imaging of this area. IMPRESSION: 1. Moderate to marked bilateral perinephric stranding with mild perivesical stranding. Findings are concerning for urinary tract infection. 2. Bilateral cortical scarring. 3. 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 These areas are not fully evaluated on noncontrast imaging but there is no substantial  ureteral dilation to support the presence of hydronephrosis. 4. Consider sonographic evaluation for further assessment. 5. Compression fracture with greater than 50% loss of height at the L2 level with mild to moderate ventral canal encroachment due to posterior element retropulsion, findings likely acute or subacute. 6. Aortic atherosclerosis. Aortic Atherosclerosis (ICD10-I70.0). These results will be called to the ordering clinician or representative by the Radiologist Assistant, and communication documented in the PACS or [employer]. Electronically Signed   By: Zetta Bills M.D.   On: 05/25/2022 14:32   CT Head Wo Contrast  Result Date: 05/23/2022 CLINICAL DATA:  Mental status change EXAM: CT HEAD WITHOUT CONTRAST TECHNIQUE: Contiguous axial images were obtained from the base of the skull through the vertex without intravenous contrast. RADIATION DOSE REDUCTION: This exam was performed according to the departmental dose-optimization program which includes automated exposure control, adjustment of the mA and/or kV according to patient size and/or use of iterative reconstruction technique. COMPARISON:  Head CT dated Mar 28, 2022 FINDINGS: Brain: Chronic white matter ischemic change and cerebral atrophy, similar to prior. No evidence of acute infarction, hemorrhage, hydrocephalus, extra-axial collection or mass lesion/mass effect. Vascular: No hyperdense vessel or unexpected calcification. Skull: Normal. Negative for fracture or focal lesion. Sinuses/Orbits: No acute finding. Other: None. IMPRESSION: No acute intracranial abnormality. Electronically Signed   By: Yetta Glassman M.D.   On: 05/23/2022 10:44   DG Chest Portable  1 View  Result Date: 05/23/2022 CLINICAL DATA:  Sepsis. EXAM: PORTABLE CHEST 1 VIEW COMPARISON:  January 03, 2022. FINDINGS: The heart size and mediastinal contours are within normal limits. Both lungs are clear. The visualized skeletal structures are unremarkable. IMPRESSION:  No active disease. Aortic Atherosclerosis (ICD10-I70.0). Electronically Signed   By: Marijo Conception M.D.   On: 05/23/2022 10:38    Microbiology: Results for orders placed or performed during the hospital encounter of 05/23/22  Urine Culture     Status: Abnormal   Collection Time: 05/23/22 10:15 AM   Specimen: Urine, Clean Catch  Result Value Ref Range Status   Specimen Description   Final    URINE, CLEAN CATCH Performed at [facility], [address], [place], [place] [address]    Special Requests   Final    NONE Performed at [facility], [address] [place], [place] [address]    Culture >=100,000 COLONIES/mL ESCHERICHIA COLI (A)  Final   Report Status 05/25/2022 FINAL  Final   Organism ID, Bacteria ESCHERICHIA COLI (A)  Final      Susceptibility   Escherichia coli - MIC*    AMPICILLIN >=32 RESISTANT Resistant     CEFAZOLIN <=4 SENSITIVE Sensitive     CEFEPIME <=0.12 SENSITIVE Sensitive     CEFTRIAXONE <=0.25 SENSITIVE Sensitive     CIPROFLOXACIN <=0.25 SENSITIVE Sensitive     GENTAMICIN <=1 SENSITIVE Sensitive     IMIPENEM <=0.25 SENSITIVE Sensitive     NITROFURANTOIN <=16 SENSITIVE Sensitive     TRIMETH/SULFA <=20 SENSITIVE Sensitive     AMPICILLIN/SULBACTAM 16 INTERMEDIATE Intermediate     PIP/TAZO <=4 SENSITIVE Sensitive     * >=100,000 COLONIES/mL ESCHERICHIA COLI  Culture, blood (routine x 2)     Status: Abnormal   Collection Time: 05/23/22 10:15 AM   Specimen: BLOOD  Result Value Ref Range Status   Specimen Description   Final    BLOOD RIGHT FA Performed at [facility], [address], [place], [place] [address]    Special Requests   Final    BOTTLES DRAWN AEROBIC AND ANAEROBIC Blood Culture adequate volume Performed at [facility], [address] [place], [place] [address]    Culture  Setup Time   Final    GRAM NEGATIVE RODS IN BOTH AEROBIC AND ANAEROBIC BOTTLES Organism ID to follow CRITICAL RESULT CALLED TO,  READ BACK BY AND VERIFIED WITH: CARISSA DOLIN@2333  05/23/22 RH GRAM STAIN REVIEWED-AGREE WITH RESULT Performed at [facility], [address] [place], [place] [address]    Culture ESCHERICHIA COLI (A)  Final   Report Status 05/26/2022 FINAL  Final   Organism ID, Bacteria ESCHERICHIA COLI  Final      Susceptibility   Escherichia coli - MIC*    AMPICILLIN >=32 RESISTANT Resistant     CEFAZOLIN <=4 SENSITIVE Sensitive     CEFEPIME <=0.12 SENSITIVE Sensitive     CEFTAZIDIME <=1 SENSITIVE Sensitive     CEFTRIAXONE <=0.25 SENSITIVE Sensitive     CIPROFLOXACIN <=0.25 SENSITIVE Sensitive     GENTAMICIN <=1 SENSITIVE Sensitive     IMIPENEM <=0.25 SENSITIVE Sensitive     TRIMETH/SULFA <=20 SENSITIVE Sensitive     AMPICILLIN/SULBACTAM 16 INTERMEDIATE Intermediate     PIP/TAZO <=4 SENSITIVE Sensitive     * ESCHERICHIA COLI  Blood Culture ID Panel (Reflexed)     Status: Abnormal   Collection Time: 05/23/22 10:15 AM  Result Value Ref Range Status   Enterococcus faecalis NOT  DETECTED NOT DETECTED Final   Enterococcus Faecium NOT DETECTED NOT DETECTED Final   Listeria monocytogenes NOT DETECTED NOT DETECTED Final   Staphylococcus species NOT DETECTED NOT DETECTED Final   Staphylococcus aureus (BCID) NOT DETECTED NOT DETECTED Final   Staphylococcus epidermidis NOT DETECTED NOT DETECTED Final   Staphylococcus lugdunensis NOT DETECTED NOT DETECTED Final   Streptococcus species NOT DETECTED NOT DETECTED Final   Streptococcus agalactiae NOT DETECTED NOT DETECTED Final   Streptococcus pneumoniae NOT DETECTED NOT DETECTED Final   Streptococcus pyogenes NOT DETECTED NOT DETECTED Final   A.calcoaceticus-baumannii NOT DETECTED NOT DETECTED Final   Bacteroides fragilis NOT DETECTED NOT DETECTED Final   Enterobacterales DETECTED (A) NOT DETECTED Final    Comment: Enterobacterales represent a large order of gram negative bacteria, not a single organism. CRITICAL RESULT CALLED TO, READ BACK BY  AND VERIFIED WITH: CARISSA DOLIN@2333  05/23/22 RH    Enterobacter cloacae complex NOT DETECTED NOT DETECTED Final   Escherichia coli DETECTED (A) NOT DETECTED Final    Comment: CRITICAL RESULT CALLED TO, READ BACK BY AND VERIFIED WITH: CARISSA DOLIN @2333  05/23/22 RH    Klebsiella aerogenes NOT DETECTED NOT DETECTED Final   Klebsiella oxytoca NOT DETECTED NOT DETECTED Final   Klebsiella pneumoniae NOT DETECTED NOT DETECTED Final   Proteus species NOT DETECTED NOT DETECTED Final   Salmonella species NOT DETECTED NOT DETECTED Final   Serratia marcescens NOT DETECTED NOT DETECTED Final   Haemophilus influenzae NOT DETECTED NOT DETECTED Final   Neisseria meningitidis NOT DETECTED NOT DETECTED Final   Pseudomonas aeruginosa NOT DETECTED NOT DETECTED Final   Stenotrophomonas maltophilia NOT DETECTED NOT DETECTED Final   Candida albicans NOT DETECTED NOT DETECTED Final   Candida auris NOT DETECTED NOT DETECTED Final   Candida glabrata NOT DETECTED NOT DETECTED Final   Candida krusei NOT DETECTED NOT DETECTED Final   Candida parapsilosis NOT DETECTED NOT DETECTED Final   Candida tropicalis NOT DETECTED NOT DETECTED Final   Cryptococcus neoformans/gattii NOT DETECTED NOT DETECTED Final   CTX-M ESBL NOT DETECTED NOT DETECTED Final   Carbapenem resistance IMP NOT DETECTED NOT DETECTED Final   Carbapenem resistance KPC NOT DETECTED NOT DETECTED Final   Carbapenem resistance NDM NOT DETECTED NOT DETECTED Final   Carbapenem resist OXA 48 LIKE NOT DETECTED NOT DETECTED Final   Carbapenem resistance VIM NOT DETECTED NOT DETECTED Final    Comment: Performed at [facility], [address] [place], [place] [address]  Culture, blood (Routine X 2) w Reflex to ID Panel     Status: None   Collection Time: 05/23/22  9:40 PM   Specimen: BLOOD  Result Value Ref Range Status   Specimen Description BLOOD LEFT HAND  Final   Special Requests   Final    BOTTLES DRAWN AEROBIC ONLY Blood Culture  adequate volume   Culture   Final    NO GROWTH 5 DAYS Performed at [facility], [address] [place], [place] [address]    Report Status 05/28/2022 FINAL  Final    Labs: CBC: Recent Labs  Lab 05/23/22 1015 05/24/22 0527 05/26/22 0547 05/28/22 0448  WBC 13.1* 10.8* 7.6 9.6  NEUTROABS 10.4*  --   --   --   HGB 13.1 12.5 12.8 12.3  HCT 42.5 40.4 41.5 39.6  MCV 88.2 87.8 87.9 86.5  PLT 461* 411* 523* [id number]*   Basic Metabolic Panel: Recent Labs  Lab 05/23/22 1015 05/23/22 1405 05/23/22 2140 05/24/22 0527 05/26/22 0547 05/28/22 0448  NA 143  --   --  144 139 140  K 4.1  --   --  3.9 4.0 4.3  CL 110  --   --  112* 109 106  CO2 24  --   --  27 27 27   GLUCOSE 139*  --   --  116* 109* 106*  BUN 21  --   --  22 20 16   CREATININE 1.61*  --   --  1.44* 1.17* 1.18*  CALCIUM 9.7  --   --  9.1 9.2 9.3  MG  --  1.9 2.2  --   --   --   PHOS  --  2.3*  --   --   --   --    Liver Function Tests: Recent Labs  Lab 05/23/22 1015  AST 27  ALT 30  ALKPHOS 96  BILITOT 0.7  PROT 7.8  ALBUMIN 3.7   CBG: Recent Labs  Lab 05/23/22 1625  GLUCAP 129*    Discharge time spent: greater than 30 minutes.  Signed: 05/25/22, MD [facility] 05/28/2022

## 2022-05-28 NOTE — Plan of Care (Signed)

## 2022-05-28 NOTE — Progress Notes (Signed)
Awaiting for family to pick patient up. Ready for discharge.

## 2022-06-08 ENCOUNTER — Other Ambulatory Visit: Payer: Self-pay

## 2022-06-08 ENCOUNTER — Emergency Department: Payer: Medicare PPO

## 2022-06-08 ENCOUNTER — Emergency Department
Admission: EM | Admit: 2022-06-08 | Discharge: 2022-06-09 | Disposition: A | Discharge status: Home / Self Care | Payer: Medicare PPO | Attending: Emergency Medicine | Admitting: Emergency Medicine

## 2022-06-08 ENCOUNTER — Encounter: Payer: Self-pay | Admitting: Emergency Medicine

## 2022-06-08 DIAGNOSIS — R748 Abnormal levels of other serum enzymes: Secondary | ICD-10-CM | POA: Diagnosis not present

## 2022-06-08 DIAGNOSIS — F02818 Dementia in other diseases classified elsewhere, unspecified severity, with other behavioral disturbance: Secondary | ICD-10-CM | POA: Diagnosis not present

## 2022-06-08 DIAGNOSIS — W19XXXA Unspecified fall, initial encounter: Secondary | ICD-10-CM

## 2022-06-08 DIAGNOSIS — Z23 Encounter for immunization: Secondary | ICD-10-CM | POA: Insufficient documentation

## 2022-06-08 DIAGNOSIS — F0390 Unspecified dementia without behavioral disturbance: Secondary | ICD-10-CM | POA: Diagnosis present

## 2022-06-08 DIAGNOSIS — R319 Hematuria, unspecified: Secondary | ICD-10-CM | POA: Insufficient documentation

## 2022-06-08 DIAGNOSIS — Z20822 Contact with and (suspected) exposure to covid-19: Secondary | ICD-10-CM | POA: Diagnosis not present

## 2022-06-08 DIAGNOSIS — F03918 Unspecified dementia, unspecified severity, with other behavioral disturbance: Secondary | ICD-10-CM

## 2022-06-08 LAB — URINALYSIS, ROUTINE W REFLEX MICROSCOPIC
Bilirubin Urine: NEGATIVE
Glucose, UA: NEGATIVE mg/dL
Hgb urine dipstick: NEGATIVE
Ketones, ur: NEGATIVE mg/dL
Leukocytes,Ua: NEGATIVE
Nitrite: NEGATIVE
Protein, ur: NEGATIVE mg/dL
Specific Gravity, Urine: 1.021 (ref 1.005–1.030)
pH: 5 (ref 5.0–8.0)

## 2022-06-08 LAB — BASIC METABOLIC PANEL
Anion gap: 8 (ref 5–15)
BUN: 16 mg/dL (ref 8–23)
CO2: 25 mmol/L (ref 22–32)
Calcium: 9.8 mg/dL (ref 8.9–10.3)
Chloride: 109 mmol/L (ref 98–111)
Creatinine, Ser: 1.3 mg/dL — ABNORMAL HIGH (ref 0.44–1.00)
GFR, Estimated: 43 mL/min — ABNORMAL LOW (ref >60)
Glucose, Bld: 119 mg/dL — ABNORMAL HIGH (ref 70–99)
Potassium: 3.9 mmol/L (ref 3.5–5.1)
Sodium: 142 mmol/L (ref 135–145)

## 2022-06-08 LAB — CBC WITH DIFFERENTIAL/PLATELET
Abs Immature Granulocytes: 0.03 10*3/uL (ref 0.00–0.07)
Basophils Absolute: 0.1 10*3/uL (ref 0.0–0.1)
Basophils Relative: 1 %
Eosinophils Absolute: 0.2 10*3/uL (ref 0.0–0.5)
Eosinophils Relative: 3 %
HCT: 44.6 % (ref 36.0–46.0)
Hemoglobin: 13.9 g/dL (ref 12.0–15.0)
Immature Granulocytes: 0 %
Lymphocytes Relative: 31 %
Lymphs Abs: 2.3 10*3/uL (ref 0.7–4.0)
MCH: 27 pg (ref 26.0–34.0)
MCHC: 31.2 g/dL (ref 30.0–36.0)
MCV: 86.8 fL (ref 80.0–100.0)
Monocytes Absolute: 0.6 10*3/uL (ref 0.1–1.0)
Monocytes Relative: 7 %
Neutro Abs: 4.3 10*3/uL (ref 1.7–7.7)
Neutrophils Relative %: 58 %
Platelets: 541 10*3/uL — ABNORMAL HIGH (ref 150–400)
RBC: 5.14 MIL/uL — ABNORMAL HIGH (ref 3.87–5.11)
RDW: 14.2 % (ref 11.5–15.5)
WBC: 7.4 10*3/uL (ref 4.0–10.5)
nRBC: 0 % (ref 0.0–0.2)

## 2022-06-08 LAB — HEPATIC FUNCTION PANEL
ALT: 36 U/L (ref 0–44)
AST: 36 U/L (ref 15–41)
Albumin: 4 g/dL (ref 3.5–5.0)
Alkaline Phosphatase: 96 U/L (ref 38–126)
Bilirubin, Direct: 0.2 mg/dL (ref 0.0–0.2)
Indirect Bilirubin: 0.4 mg/dL (ref 0.3–0.9)
Total Bilirubin: 0.6 mg/dL (ref 0.3–1.2)
Total Protein: 8.3 g/dL — ABNORMAL HIGH (ref 6.5–8.1)

## 2022-06-08 LAB — LIPASE, BLOOD: Lipase: 71 U/L — ABNORMAL HIGH (ref 11–51)

## 2022-06-08 LAB — TROPONIN I (HIGH SENSITIVITY): Troponin I (High Sensitivity): 16 ng/L (ref <18)

## 2022-06-08 MED ORDER — TETANUS-DIPHTH-ACELL PERTUSSIS 5-2.5-18.5 LF-MCG/0.5 IM SUSY
0.5000 mL | PREFILLED_SYRINGE | Freq: Once | INTRAMUSCULAR | Status: AC
Start: 2022-06-08 — End: 2022-06-08
  Administered 2022-06-08: 0.5 mL via INTRAMUSCULAR
  Filled 2022-06-08: qty 0.5

## 2022-06-08 MED ORDER — LIDOCAINE-EPINEPHRINE-TETRACAINE (LET) TOPICAL GEL
3.0000 mL | Freq: Once | TOPICAL | Status: DC
  Filled 2022-06-08: qty 3

## 2022-06-08 NOTE — ED Notes (Signed)
This RN spoke with pt daughter. She expressed concern that pt was having to wait in the lobby because she has dementia. Pt daughter expressed that she was concerned that pt may wonder off. This RN explained that we have staff in the lobby to help keep an eye on pts and that the daughter was also welcomed to stay with pt while she was waiting, daughter is unable to stay due to having to go to work. Pt was placed in a recliner and placed in triage middle so that there would be multiple staff with eyes on patient in case she did try to wonder. Pt daughter agreeable to this arrangement while pt is waiting to see provider.

## 2022-06-08 NOTE — ED Notes (Addendum)
Patient resting in bed free from sign of distress. Breathing unlabored speaking in full sentences with symmetric chest rise and fall. Bed low and locked with side rails raised x2. Call bell in reach and monitor in place. Fall mats in place as well as bed alarm.  

## 2022-06-08 NOTE — ED Triage Notes (Signed)
Patient alone in triage- hx of dementia and states she is unsure why she is here and has no complaints. See first nurse note for ems report.

## 2022-06-08 NOTE — ED Notes (Signed)
This RN called pts daughter, [name], and informed her that pt is being placed in ED rm 8 to be evaluated by EDP.

## 2022-06-08 NOTE — ED Notes (Signed)
MD applied dermabond to laceration to R eyebrow. Edges now approximated and no bleeding noted.

## 2022-06-08 NOTE — ED Notes (Signed)
Patient resting in bed free from sign of distress. Breathing unlabored speaking in full sentences with symmetric chest rise and fall. Bed low and locked with side rails raised x2. Call bell in reach and monitor in place. Fall mats in place as well as bed alarm.

## 2022-06-08 NOTE — ED Provider Triage Note (Signed)
Emergency Medicine Provider Triage Evaluation Note  Patient is unable to give HPI secondary to baseline dementia.  Yolanda Moore, a 76 y.o. female  was evaluated in triage.  Pt complains of dysuria and hematuria.  Patient with a history of dementia at baseline, presents via EMS from home.  Daughter reports seeing some blood seen in the patient's underwear.  She was treated for UTI and recently completed a course of antibiotics.  Review of Systems  Positive: hematuria Negative: FCS  Physical Exam  BP (!) 123/57   Pulse 61   Temp 98 F (36.7 C) (Oral)   Resp 16   Ht 5' (1.524 m)   Wt 69.9 kg   SpO2 99%   BMI 30.10 kg/m  Gen:   Awake, no distress  NAD Resp:  Normal effort  MSK:   Moves extremities without difficulty  Other:    Medical Decision Making  Medically screening exam initiated at 3:43 PM.  Appropriate orders placed.  Yolanda Moore was informed that the remainder of the evaluation will be completed by another provider, this initial triage assessment does not replace that evaluation, and the importance of remaining in the ED until their evaluation is complete.  Geriatric patient with dementia at baseline, presents with concern for possible recurrent UTI.  She presents via EMS from home, with reports from her daughter of bloodstained panties.  Patient recently completed a course of antibiotics for recent UTI.   Lissa Hoard, PA-C 06/08/22 1546

## 2022-06-08 NOTE — ED Provider Notes (Signed)
[facility] Provider Note    Event Date/Time   First MD Initiated Contact with Patient 06/08/22 1753     (approximate)   History   Hematuria   HPI  Yolanda Moore is a 76 y.o. female  here with blood in her depends.  Patient reportedly was recently hospitalized for UTI and was on broad-spectrum antibiotics.  She had a family member checked her brief today and had a small amount of what appeared to be pinkish urine or blood.  She was subsequent sent for evaluation.  Patient also had transient shaking like she is uncomfortable.  Currently, the patient denies complaints.  History limited due to dementia.  Had a long discussion with the patient's daughter, who is very concerned about an underlying, ongoing infection.  Patient has not had fevers.  She completed her course of Levaquin that was prescribed for her sepsis/UTI.    Physical Exam   Triage Vital Signs: ED Triage Vitals  Enc Vitals Group     BP 06/08/22 1529 (!) 123/57     Pulse Rate 06/08/22 1529 61     Resp 06/08/22 1529 16     Temp 06/08/22 1529 98 F (36.7 C)     Temp Source 06/08/22 1529 Oral     SpO2 06/08/22 1529 99 %     Weight 06/08/22 1534 154 lb 1.6 oz (69.9 kg)     Height 06/08/22 1534 5' (1.524 m)     Head Circumference --      Peak Flow --      Pain Score 06/08/22 1534 0     Pain Loc --      Pain Edu? --      Excl. in GC? --     Most recent vital signs: Vitals:   06/08/22 2119 06/08/22 2300  BP:  (!) 150/66  Pulse: 63 (!) 57  Resp: 20 17  Temp: 99.1 F (37.3 C)   SpO2: 99% 99%     General: Awake, no distress.  CV:  Good peripheral perfusion. RRR. No murmurs. Resp:  Normal effort. Lungs CTAB. Abd:  No distention. No tenderness. Other:  On GU exam, no apparent abnormality.  No vaginal bleeding.  Stool is soft, brown, Hemoccult negative.  No perineal lacerations or excoriations.   ED Results / Procedures / Treatments   Labs (all labs ordered are listed, but only  abnormal results are displayed) Labs Reviewed  BASIC METABOLIC PANEL - Abnormal; Notable for the following components:      Result Value   Glucose, Bld 119 (*)    Creatinine, Ser 1.30 (*)    GFR, Estimated 43 (*)    All other components within normal limits  CBC WITH DIFFERENTIAL/PLATELET - Abnormal; Notable for the following components:   RBC 5.14 (*)    Platelets 541 (*)    All other components within normal limits  URINALYSIS, ROUTINE W REFLEX MICROSCOPIC - Abnormal; Notable for the following components:   Color, Urine YELLOW (*)    APPearance CLEAR (*)    All other components within normal limits  HEPATIC FUNCTION PANEL - Abnormal; Notable for the following components:   Total Protein 8.3 (*)    All other components within normal limits  LIPASE, BLOOD - Abnormal; Notable for the following components:   Lipase 71 (*)    All other components within normal limits  RESP PANEL BY RT-PCR (FLU A&B, COVID) ARPGX2  TROPONIN I (HIGH SENSITIVITY)     EKG Sinus  bradycardia, ventricular rate 58.  PR 137, QRS 90, QTc 496.  LVH, borderline prolonged QT.  No significant changes from prior.   RADIOLOGY CT renal stone: No acute abnormality CT head/C-spine: No acute abnormality CT face: Negative DG pelvis: Negative Chest x-ray: Negative   I also independently reviewed and agree with radiologist interpretations.   PROCEDURES:  Critical Care performed: No   MEDICATIONS ORDERED IN ED: Medications  lidocaine-EPINEPHrine-tetracaine (LET) topical gel (3 mLs Topical Not Given 06/08/22 2120)  Tdap (BOOSTRIX) injection 0.5 mL (0.5 mLs Intramuscular Given 06/08/22 2253)     IMPRESSION / MDM / ASSESSMENT AND PLAN / ED COURSE  I reviewed the triage vital signs and the nursing notes.                               The patient is on the cardiac monitor to evaluate for evidence of arrhythmia and/or significant heart rate changes.   Ddx:  Differential includes the following, with  pertinent life- or limb-threatening emergencies considered:  Persistent hematuria in the setting of UTI, nephrolithiasis, vaginal bleeding, dehydration with concentrated urine, less likely GI bleed  Patient's presentation is most consistent with acute complicated illness / injury requiring diagnostic workup.  MDM:  76 year old female with complex past medical history including recent admission for E. coli sepsis with UTI, here with questionable hematuria.  Urinalysis here obtained via cath sample shows no evidence of ongoing infection as well as no hematuria.  She has had no bleeding here.  Full GU exam performed without any evidence of bleeding.  CBC is very reassuring with no leukocytosis or anemia.  LFTs normal.  Renal function is at baseline.  Lipase minimally elevated, CT renal stone obtained shows no evidence of stone, pancreatitis, or other abnormality.  Troponin is below her baseline.  Of note, while here, patient attempted to get up and leave the ED.  She has a history of frequent wandering.  She did fall at this time.  I immediately assessed her, she has a small abrasion/superficial laceration to the right brow which was repaired with Dermabond.  Tetanus is up-to-date.  No other injuries from the fall.  Plain films and CT imaging obtained after the fall showed no evidence of traumatic abnormality.  I had a long discussion with the patient's daughter/POA.  She is concerned about patient's shaking/possible confusion today, and is hesitant about taking her home.  She currently lives in an assisted living setting at has 24-hour care per her report.  She requests social work evaluation to see if patient might qualify for memory unit or more advanced care which I think is reasonable given her history of recurrent falls and dementia.  She understands that we may not be able to assist with this, especially given her recent admission, and is aware that family should come in the morning to assist or take  her home should she be cleared.  Based on reassuring labs, vitals, observation, no apparent emergent indication for admission or medical emergency at this time.  MEDICATIONS GIVEN IN ED: Medications  lidocaine-EPINEPHrine-tetracaine (LET) topical gel (3 mLs Topical Not Given 06/08/22 2120)  Tdap (BOOSTRIX) injection 0.5 mL (0.5 mLs Intramuscular Given 06/08/22 2253)     Consults:  TOC   EMR reviewed       FINAL CLINICAL IMPRESSION(S) / ED DIAGNOSES   Final diagnoses:  Dementia with behavioral disturbance (HCC)     Rx / DC Orders   ED  Discharge Orders     None        Note:  This document was prepared using Dragon voice recognition software and may include unintentional dictation errors.   Shaune Pollack, MD 06/08/22 2330

## 2022-06-08 NOTE — ED Notes (Signed)
Pt had witnessed fall by transport team member in hallway outside of nurses station. Pt independently got herself out of recliner and was walking with her walker down hallway. Pt reports "my feet went out from under me." Pt witnessed fall hitting head on IV cart. 1 inch laceration noted to R eyebrow. Bandage placed and bleeding controlled. No LOC noted. Pt denies h/a, dizziness, paraesthesia. Pt alert and following commands. Pt placed in bed with bed alarm in place. Bed low and locked with side rails raised x2. Call bell in reach and cardiac monitor in place. MD and charge RN made aware.

## 2022-06-08 NOTE — ED Notes (Signed)
Patient resting in recliner free from sign of distress. Breathing unlabored speaking in full sentences with symmetric chest rise and fall. Recliner low and locked with side rails raised x2. Call bell in reach and monitor in place. Pt walker at bedside.

## 2022-06-08 NOTE — ED Notes (Signed)
Patient transported to CT 

## 2022-06-08 NOTE — ED Notes (Signed)
Patient resting in bed free from sign of distress. Breathing unlabored speaking in full sentences with symmetric chest rise and fall. Bed low and locked with side rails raised x2. Call bell in reach and monitor in place. Fall mats in place as well as bed alarm.  

## 2022-06-08 NOTE — ED Triage Notes (Signed)
First Nurse Note;  Pt via EMS from home. Pt was recently dx with UTI, noticed some blood in her underwear. Pt just finished her week of antibiotics. Pt denies any complaint.   98.2 137 CBG 132/55 67 HR

## 2022-06-09 LAB — RESP PANEL BY RT-PCR (FLU A&B, COVID) ARPGX2
Influenza A by PCR: NEGATIVE
Influenza B by PCR: NEGATIVE
SARS Coronavirus 2 by RT PCR: NEGATIVE

## 2022-06-09 NOTE — ED Notes (Signed)
Patient resting in bed free from sign of distress. Breathing unlabored speaking in full sentences with symmetric chest rise and fall. Bed low and locked with side rails raised x2. Call bell in reach and monitor in place. Fall mats and bed alarm remain in use.   Pt assessed for pain and patient continues to deny pain or discomfort. Pt brief assessed and noted to be dry. Pt denies need to void and declines offer for bathroom assistance at this time.

## 2022-06-09 NOTE — TOC Initial Note (Signed)
Transition of Care [facility]) - Initial/Assessment Note    Patient Details  Name: Yolanda Moore MRN: [id number] Date of Birth: 10/04/1946  [facility]) CM/SW Contact:    Allayne Butcher, RN Phone Number: 06/09/2022, 10:31 AM  Clinical Narrative:                 Patient came into the emergency room concerns that she was recently discharged for UTI on antibiotics and some blood was noticed in her underwear.  Patient has dementia. RNCM spoke with patient's daughter [name] via phone.  Wilma reports that they hare starting to have a hard time caring for the patient and they don't know what to do.  She does not qualify for Medicaid and they have reached out to [facility] but the only get Vouchers for 15 hours of help for 3 months, they also have [facility] for home health but it is just not enough.  The home health will cont but only for about 6 weeks, she is looking for options long term.  The patient lives at the [facility].  RNCM provided Daughter with information on [facility], Anibal Henderson Clinical Director- [phone], maybe they can assist with some in home care.  Also provided her with information on [employer] in [place] to assist with Medicaid and special assistance.  Daughter is interested in Memory Care but they are unable to private pay.    Patient does not need to be admitted to the hospital.  Daughter will have the patient picked up this afternoon.  Expected Discharge Plan: Home w Home Health Services Barriers to Discharge: Barriers Resolved   Patient Goals and CMS Choice Patient states their goals for this hospitalization and ongoing recovery are:: Daughter wanting information on more resources      Expected Discharge Plan and Services Expected Discharge Plan: Home w Home Health Services   Discharge Planning Services: CM Consult   Living arrangements for the past 2 months: Apartment                 DME  Arranged: N/A DME Agency: NA         HH Agency: [facility]        Prior Living Arrangements/Services Living arrangements for the past 2 months: Apartment Lives with:: Self Patient language and need for interpreter reviewed:: Yes Do you feel safe going back to the place where you live?: Yes      Need for Family Participation in Patient Care: Yes (Comment) Care giver support system in place?: Yes (comment)   Criminal Activity/Legal Involvement Pertinent to Current Situation/Hospitalization: No - Comment as needed  Activities of Daily Living      Permission Sought/Granted Permission sought to share information with : Case Manager, Family Supports Permission granted to share information with : Yes, Verbal Permission Granted  Share Information with NAME: Wilma Poteat     Permission granted to share info w Relationship: Daughter  Permission granted to share info w Contact Information: [phone]  Emotional Assessment       Orientation: : Oriented to Self Alcohol / Substance Use: Not Applicable Psych Involvement: No (comment)  Admission diagnosis:  poss uti Patient Active Problem List   Diagnosis Date Noted   Acute cystitis with hematuria 05/25/2022   Acute metabolic encephalopathy 05/25/2022   Sepsis due to Escherichia coli (E. coli) (HCC) 05/24/2022   Insomnia 05/23/2022   Hyperlipidemia 05/23/2022   Acute kidney injury superimposed on  chronic kidney disease (HCC)    Elevated troponin    Chronic kidney disease, stage 3b (HCC) 09/07/2020   Dementia without behavioral disturbance (HCC) 09/07/2020   NSTEMI (non-ST elevated myocardial infarction) (HCC) 09/07/2020   Weakness 09/07/2020   Essential hypertension 04/23/2018   PCP:  Marisue Ivan, MD Pharmacy:   [facility] [address] ([address]),  - [address] [place]) [place] [address] Phone: [phone] Fax: [phone]     Social  Determinants of Health (SDOH) Interventions    Readmission Risk Interventions     No data to display

## 2022-06-09 NOTE — ED Notes (Signed)
Patient resting in bed free from sign of distress. Breathing unlabored speaking in full sentences with symmetric chest rise and fall. Bed low and locked with side rails raised x2. Call bell in reach and monitor in place. Fall mats and bed alarm in use.

## 2022-06-09 NOTE — ED Notes (Signed)
Patient resting in bed free from sign of distress. Breathing unlabored speaking in full sentences with symmetric chest rise and fall. Bed low and locked with side rails raised x2. Call bell in reach and monitor in place. Fall mats and bed alarm in use.   

## 2022-06-09 NOTE — ED Notes (Addendum)
Called pt's daughter Rivka Barbara [phone]), and let her know her mother would be discharged. Rivka Barbara will come pick her mother up from work, approx 2 hours from now.

## 2022-06-09 NOTE — ED Notes (Signed)
Spoke with pt's daughter, she states that her sister Rivka Barbara can pick pt. Up at noon, but is unable to pick her up after that. This RN offered to contact social work to see where they are on discharging pt. This RN will contact Glenda when more information is made clear.

## 2022-06-09 NOTE — ED Notes (Signed)
Patient resting in bed free from sign of distress. Breathing unlabored speaking in full sentences with symmetric chest rise and fall. Bed low and locked with side rails raised x2. Call bell in reach and monitor in place. Bed alarm and fall mats in use.   

## 2022-06-09 NOTE — ED Notes (Signed)
Patient resting in bed free from sign of distress. Breathing unlabored speaking in full sentences with symmetric chest rise and fall. Bed low and locked with side rails raised x2. Call bell in reach and monitor in place. Bed alarm and fall mats in use.

## 2022-06-09 NOTE — ED Notes (Addendum)
Pt. Resting in bed, eyes closed, chest rise and fall. Cardiac monitoring maintained. Will assess further when pt wakes. Floor mats in place. Bed alarm maintained.

## 2022-06-09 NOTE — ED Notes (Signed)
Patients daughter called requesting update. Family was updated regarding pt status throughout the night and educated regarding process of working with social work and/or case management to ensure pt and family have access to the appropriate resources at home to ensure health and safety. Daughter verbalized understanding of all discussed and denies further questions at this time.

## 2022-06-09 NOTE — ED Notes (Signed)
Patient resting in bed free from sign of distress. Breathing unlabored  with symmetric chest rise and fall. Bed low and locked with side rails raised x2. Call bell in reach and monitor in place. Bed alarm in use as well as fall mats. Pt woke to verbal stimuli and denies pain or need to void at this time. Pt denies other needs.

## 2022-06-09 NOTE — ED Provider Notes (Signed)
Patient social worker spoke with patient's daughter who is comfortable picking up patient will take her home follow-up with PCP.  Social worker will also use some additional resources.  Discharged in stable condition.   Gilles Chiquito, MD 06/09/22 [phone]

## 2022-06-09 NOTE — ED Notes (Signed)
Patient resting in bed free from sign of distress. Breathing unlabored speaking in full sentences with symmetric chest rise and fall. Bed low and locked with side rails raised x2. Call bell in reach and monitor in place.   

## 2022-07-22 ENCOUNTER — Inpatient Hospital Stay
Admission: EM | Admit: 2022-07-22 | Discharge: 2022-08-02 | DRG: 177 | Disposition: A | Discharge status: Skilled Nursing Facility | Payer: Medicare PPO | Attending: Internal Medicine | Admitting: Internal Medicine

## 2022-07-22 ENCOUNTER — Emergency Department: Payer: Medicare PPO

## 2022-07-22 DIAGNOSIS — Z87891 Personal history of nicotine dependence: Secondary | ICD-10-CM

## 2022-07-22 DIAGNOSIS — I509 Heart failure, unspecified: Secondary | ICD-10-CM

## 2022-07-22 DIAGNOSIS — B952 Enterococcus as the cause of diseases classified elsewhere: Secondary | ICD-10-CM

## 2022-07-22 DIAGNOSIS — I5032 Chronic diastolic (congestive) heart failure: Secondary | ICD-10-CM

## 2022-07-22 DIAGNOSIS — U071 COVID-19: Secondary | ICD-10-CM | POA: Diagnosis not present

## 2022-07-22 DIAGNOSIS — J9601 Acute respiratory failure with hypoxia: Secondary | ICD-10-CM

## 2022-07-22 DIAGNOSIS — I2510 Atherosclerotic heart disease of native coronary artery without angina pectoris: Secondary | ICD-10-CM | POA: Diagnosis present

## 2022-07-22 DIAGNOSIS — Z7982 Long term (current) use of aspirin: Secondary | ICD-10-CM

## 2022-07-22 DIAGNOSIS — I130 Hypertensive heart and chronic kidney disease with heart failure and stage 1 through stage 4 chronic kidney disease, or unspecified chronic kidney disease: Secondary | ICD-10-CM | POA: Diagnosis present

## 2022-07-22 DIAGNOSIS — Z79899 Other long term (current) drug therapy: Secondary | ICD-10-CM

## 2022-07-22 DIAGNOSIS — E870 Hyperosmolality and hypernatremia: Secondary | ICD-10-CM

## 2022-07-22 DIAGNOSIS — J1282 Pneumonia due to coronavirus disease 2019: Secondary | ICD-10-CM | POA: Diagnosis present

## 2022-07-22 DIAGNOSIS — Z90710 Acquired absence of both cervix and uterus: Secondary | ICD-10-CM

## 2022-07-22 DIAGNOSIS — G4700 Insomnia, unspecified: Secondary | ICD-10-CM | POA: Diagnosis present

## 2022-07-22 DIAGNOSIS — Z66 Do not resuscitate: Secondary | ICD-10-CM | POA: Diagnosis present

## 2022-07-22 DIAGNOSIS — G9341 Metabolic encephalopathy: Secondary | ICD-10-CM | POA: Diagnosis not present

## 2022-07-22 DIAGNOSIS — R0602 Shortness of breath: Secondary | ICD-10-CM | POA: Diagnosis not present

## 2022-07-22 DIAGNOSIS — R4182 Altered mental status, unspecified: Principal | ICD-10-CM

## 2022-07-22 DIAGNOSIS — N390 Urinary tract infection, site not specified: Secondary | ICD-10-CM | POA: Diagnosis present

## 2022-07-22 DIAGNOSIS — Z8041 Family history of malignant neoplasm of ovary: Secondary | ICD-10-CM

## 2022-07-22 DIAGNOSIS — I252 Old myocardial infarction: Secondary | ICD-10-CM

## 2022-07-22 DIAGNOSIS — I10 Essential (primary) hypertension: Secondary | ICD-10-CM | POA: Diagnosis present

## 2022-07-22 DIAGNOSIS — N1832 Chronic kidney disease, stage 3b: Secondary | ICD-10-CM | POA: Diagnosis present

## 2022-07-22 DIAGNOSIS — R531 Weakness: Secondary | ICD-10-CM

## 2022-07-22 DIAGNOSIS — A0472 Enterocolitis due to Clostridium difficile, not specified as recurrent: Secondary | ICD-10-CM

## 2022-07-22 DIAGNOSIS — F0390 Unspecified dementia without behavioral disturbance: Secondary | ICD-10-CM | POA: Diagnosis present

## 2022-07-22 DIAGNOSIS — R3989 Other symptoms and signs involving the genitourinary system: Secondary | ICD-10-CM

## 2022-07-22 DIAGNOSIS — F03918 Unspecified dementia, unspecified severity, with other behavioral disturbance: Secondary | ICD-10-CM | POA: Diagnosis present

## 2022-07-22 DIAGNOSIS — E876 Hypokalemia: Secondary | ICD-10-CM

## 2022-07-22 DIAGNOSIS — R5381 Other malaise: Secondary | ICD-10-CM | POA: Diagnosis present

## 2022-07-22 DIAGNOSIS — I5033 Acute on chronic diastolic (congestive) heart failure: Secondary | ICD-10-CM | POA: Diagnosis present

## 2022-07-22 DIAGNOSIS — E785 Hyperlipidemia, unspecified: Secondary | ICD-10-CM | POA: Diagnosis present

## 2022-07-22 LAB — CBC WITH DIFFERENTIAL/PLATELET
Abs Immature Granulocytes: 0.03 10*3/uL (ref 0.00–0.07)
Basophils Absolute: 0 10*3/uL (ref 0.0–0.1)
Basophils Relative: 0 %
Eosinophils Absolute: 0.1 10*3/uL (ref 0.0–0.5)
Eosinophils Relative: 3 %
HCT: 40.9 % (ref 36.0–46.0)
Hemoglobin: 13.1 g/dL (ref 12.0–15.0)
Immature Granulocytes: 1 %
Lymphocytes Relative: 13 %
Lymphs Abs: 0.7 10*3/uL (ref 0.7–4.0)
MCH: 27.2 pg (ref 26.0–34.0)
MCHC: 32 g/dL (ref 30.0–36.0)
MCV: 85 fL (ref 80.0–100.0)
Monocytes Absolute: 0.8 10*3/uL (ref 0.1–1.0)
Monocytes Relative: 15 %
Neutro Abs: 3.4 10*3/uL (ref 1.7–7.7)
Neutrophils Relative %: 68 %
Platelets: 317 10*3/uL (ref 150–400)
RBC: 4.81 MIL/uL (ref 3.87–5.11)
RDW: 14.6 % (ref 11.5–15.5)
WBC: 5 10*3/uL (ref 4.0–10.5)
nRBC: 0 % (ref 0.0–0.2)

## 2022-07-22 LAB — URINALYSIS, ROUTINE W REFLEX MICROSCOPIC
Bilirubin Urine: NEGATIVE
Glucose, UA: NEGATIVE mg/dL
Hgb urine dipstick: NEGATIVE
Ketones, ur: NEGATIVE mg/dL
Nitrite: NEGATIVE
Protein, ur: NEGATIVE mg/dL
Specific Gravity, Urine: 1.021 (ref 1.005–1.030)
pH: 5 (ref 5.0–8.0)

## 2022-07-22 LAB — COMPREHENSIVE METABOLIC PANEL
ALT: 34 U/L (ref 0–44)
AST: 32 U/L (ref 15–41)
Albumin: 3.5 g/dL (ref 3.5–5.0)
Alkaline Phosphatase: 56 U/L (ref 38–126)
Anion gap: 7 (ref 5–15)
BUN: 12 mg/dL (ref 8–23)
CO2: 26 mmol/L (ref 22–32)
Calcium: 9.4 mg/dL (ref 8.9–10.3)
Chloride: 105 mmol/L (ref 98–111)
Creatinine, Ser: 1.26 mg/dL — ABNORMAL HIGH (ref 0.44–1.00)
GFR, Estimated: 45 mL/min — ABNORMAL LOW (ref >60)
Glucose, Bld: 108 mg/dL — ABNORMAL HIGH (ref 70–99)
Potassium: 4 mmol/L (ref 3.5–5.1)
Sodium: 138 mmol/L (ref 135–145)
Total Bilirubin: 0.6 mg/dL (ref 0.3–1.2)
Total Protein: 7.4 g/dL (ref 6.5–8.1)

## 2022-07-22 LAB — SARS CORONAVIRUS 2 BY RT PCR: SARS Coronavirus 2 by RT PCR: POSITIVE — AB

## 2022-07-22 LAB — LACTIC ACID, PLASMA
Lactic Acid, Venous: 1.1 mmol/L (ref 0.5–1.9)
Lactic Acid, Venous: 1.2 mmol/L (ref 0.5–1.9)

## 2022-07-22 LAB — BRAIN NATRIURETIC PEPTIDE: B Natriuretic Peptide: 362.9 pg/mL — ABNORMAL HIGH (ref 0.0–100.0)

## 2022-07-22 LAB — TROPONIN I (HIGH SENSITIVITY)
Troponin I (High Sensitivity): 28 ng/L — ABNORMAL HIGH (ref <18)
Troponin I (High Sensitivity): 30 ng/L — ABNORMAL HIGH (ref <18)

## 2022-07-22 LAB — C-REACTIVE PROTEIN: CRP: 1 mg/dL — ABNORMAL HIGH (ref <1.0)

## 2022-07-22 LAB — D-DIMER, QUANTITATIVE: D-Dimer, Quant: 1.27 ug/mL-FEU — ABNORMAL HIGH (ref 0.00–0.50)

## 2022-07-22 MED ORDER — TRAZODONE HCL 50 MG PO TABS
50.0000 mg | ORAL_TABLET | Freq: Every evening | ORAL | Status: DC | PRN
  Administered 2022-07-28 – 2022-07-29 (×2): 50 mg via ORAL
  Filled 2022-07-22 (×4): qty 1

## 2022-07-22 MED ORDER — CARVEDILOL 6.25 MG PO TABS
3.1250 mg | ORAL_TABLET | Freq: Two times a day (BID) | ORAL | Status: DC
  Administered 2022-07-22 – 2022-08-02 (×19): 3.125 mg via ORAL
  Filled 2022-07-22 (×6): qty 1
  Filled 2022-07-22: qty 0.5
  Filled 2022-07-22 (×4): qty 1
  Filled 2022-07-22: qty 0.5
  Filled 2022-07-22: qty 1
  Filled 2022-07-22 (×2): qty 0.5
  Filled 2022-07-22 (×7): qty 1

## 2022-07-22 MED ORDER — ENOXAPARIN SODIUM 40 MG/0.4ML IJ SOSY
40.0000 mg | PREFILLED_SYRINGE | INTRAMUSCULAR | Status: DC
  Administered 2022-07-22 – 2022-08-01 (×11): 40 mg via SUBCUTANEOUS
  Filled 2022-07-22 (×11): qty 0.4

## 2022-07-22 MED ORDER — SODIUM CHLORIDE 0.9 % IV SOLN
1.0000 g | INTRAVENOUS | Status: DC
  Administered 2022-07-23: 1 g via INTRAVENOUS
  Filled 2022-07-22: qty 10

## 2022-07-22 MED ORDER — SODIUM CHLORIDE 0.9 % IV SOLN
1.0000 g | Freq: Once | INTRAVENOUS | Status: AC
  Administered 2022-07-22: 1 g via INTRAVENOUS
  Filled 2022-07-22: qty 10

## 2022-07-22 MED ORDER — ASPIRIN 81 MG PO TBEC
81.0000 mg | DELAYED_RELEASE_TABLET | Freq: Every day | ORAL | Status: DC
  Administered 2022-07-23: 81 mg via ORAL
  Filled 2022-07-22: qty 1

## 2022-07-22 MED ORDER — DONEPEZIL HCL 5 MG PO TABS
10.0000 mg | ORAL_TABLET | Freq: Every day | ORAL | Status: DC
  Administered 2022-07-22: 10 mg via ORAL
  Filled 2022-07-22: qty 2

## 2022-07-22 MED ORDER — NIRMATRELVIR/RITONAVIR (PAXLOVID) TABLET (RENAL DOSING)
2.0000 | ORAL_TABLET | Freq: Two times a day (BID) | ORAL | Status: DC
  Administered 2022-07-22 – 2022-07-23 (×3): 2 via ORAL
  Filled 2022-07-22: qty 20

## 2022-07-22 MED ORDER — FUROSEMIDE 10 MG/ML IJ SOLN
20.0000 mg | Freq: Once | INTRAMUSCULAR | Status: AC
  Administered 2022-07-22: 20 mg via INTRAVENOUS
  Filled 2022-07-22: qty 4

## 2022-07-22 MED ORDER — POLYETHYLENE GLYCOL 3350 17 G PO PACK: 17.0000 g | PACK | Freq: Every day | ORAL | Status: DC | PRN

## 2022-07-22 MED ORDER — ATORVASTATIN CALCIUM 20 MG PO TABS: 40.0000 mg | ORAL_TABLET | Freq: Every day | ORAL | Status: DC

## 2022-07-22 NOTE — ED Triage Notes (Addendum)
Pt presents to ED via EMS due to AMS from [facility] home. Hx dementia and UTI.

## 2022-07-22 NOTE — ED Notes (Signed)
Pts daughter wants to speak to the MD about the care.

## 2022-07-22 NOTE — ED Provider Notes (Signed)
[facility] Provider Note    Event Date/Time   First MD Initiated Contact with Patient 07/22/22 [phone]     (approximate)   History   Altered Mental Status   HPI SYNDEY JASKOLSKI is a 76 y.o. female was sent to the ER from nursing home.  Her daughter went to see her this morning and apparently could not get her to respond appropriately.  Patient is now in the emergency room will hold a conversation with me response to Ms. Reagann says she feels fine denies any headache or cough or shortness of breath or bellyache or any other problems.  She does have a temperature of 100.2.  O2 sats her running between 95 and 97.      Physical Exam   Triage Vital Signs: ED Triage Vitals  Enc Vitals Group     BP 07/22/22 0959 138/73     Pulse Rate 07/22/22 0959 72     Resp 07/22/22 0959 18     Temp 07/22/22 0959 100.2 F (37.9 C)     Temp Source 07/22/22 0959 Oral     SpO2 07/22/22 0959 95 %     Weight --      Height --      Head Circumference --      Peak Flow --      Pain Score 07/22/22 1001 0     Pain Loc --      Pain Edu? --      Excl. in GC? --     Most recent vital signs: Vitals:   07/22/22 0959  BP: 138/73  Pulse: 72  Resp: 18  Temp: 100.2 F (37.9 C)  SpO2: 95%     General: Awake, no distress.  CV:  Good peripheral perfusion.  Heart regular rate and rhythm no audible murmurs Resp:  Normal effort.  Lungs are clear Abd:  No distention.  Soft and nontender Extremities with no edema   ED Results / Procedures / Treatments   Labs (all labs ordered are listed, but only abnormal results are displayed) Labs Reviewed  SARS CORONAVIRUS 2 BY RT PCR - Abnormal; Notable for the following components:      Result Value   SARS Coronavirus 2 by RT PCR POSITIVE (*)    All other components within normal limits  COMPREHENSIVE METABOLIC PANEL - Abnormal; Notable for the following components:   Glucose, Bld 108 (*)    Creatinine, Ser 1.26 (*)    GFR, Estimated 45  (*)    All other components within normal limits  URINALYSIS, ROUTINE W REFLEX MICROSCOPIC - Abnormal; Notable for the following components:   Color, Urine YELLOW (*)    APPearance HAZY (*)    Leukocytes,Ua SMALL (*)    Bacteria, UA MANY (*)    All other components within normal limits  TROPONIN I (HIGH SENSITIVITY) - Abnormal; Notable for the following components:   Troponin I (High Sensitivity) 28 (*)    All other components within normal limits  LACTIC ACID, PLASMA  CBC WITH DIFFERENTIAL/PLATELET  LACTIC ACID, PLASMA  BRAIN NATRIURETIC PEPTIDE     EKG  EKG read and interpreted by me shows normal sinus rhythm rate of 76 normal axis diffuse ST-T segment changes which have been present since August 23 and before.   RADIOLOGY  Chest x-ray read by radiology reviewed by me.  Interpreted by me.  Radiologist reads it as perihilar fullness.  It looks a little bit more like an infiltrate  to me we will get a BNP to see if this helps determine what is going on.  Patient is COVID-positive.  PROCEDURES:  Critical Care performed: Critical care time 20 minutes.  This includes reviewing the patient's old records speaking with the patient speaking with the patient's daughter and then I am talking to the hospitalist.  Procedures   MEDICATIONS ORDERED IN ED: Medications - No data to display   IMPRESSION / MDM / ASSESSMENT AND PLAN / ED COURSE  I reviewed the triage vital signs and the nursing notes. ----------------------------------------- 11:33 AM on 07/22/2022 ----------------------------------------- Patient's daughter is in the room.  She says patient still not back to her usual self.  She lives in independent living by herself and she is not currently capable of taking care of herself she is a little bit too out of it for daughter thanks and I agree.  Patient is currently less animated than she had been just laying in bed and staring at me.  Differential diagnosis includes, but is  not limited to, patient has a UTI this could be causing her altered mental status she also has COVID which is another possible cause for this.  We will get some antibiotics going I will talk to pharmacy or the hospital doc about Paxlovid or something similar  Patient's presentation is most consistent with acute presentation with potential threat to life or bodily function.  The patient is on the cardiac monitor to evaluate for evidence of arrhythmia and/or significant heart rate changes.  None have been seen    FINAL CLINICAL IMPRESSION(S) / ED DIAGNOSES   Final diagnoses:  Altered mental status, unspecified altered mental status type  Urinary tract infection with hematuria, site unspecified  COVID     Rx / DC Orders   ED Discharge Orders     None        Note:  This document was prepared using Dragon voice recognition software and may include unintentional dictation errors.   Arnaldo Natal, MD 07/22/22 1135

## 2022-07-22 NOTE — TOC Initial Note (Signed)
Transition of Care [facility]) - Initial/Assessment Note    Patient Details  Name: MIGUELINA FORE MRN: [id number] Date of Birth: [birth year]  [facility]) CM/SW Contact:    Marlowe Sax, RN Phone Number: 07/22/2022, 3:38 PM  Clinical Narrative:                   Per notes about the conversation with the daughter , Patient is intermittently confused but not confused at this level she is showing currently  She lives at independent facility by herself.  She walks without any problem. TOC to follow for needs and assist with DC planning       Patient Goals and CMS Choice        Expected Discharge Plan and Services                                                Prior Living Arrangements/Services                       Activities of Daily Living Home Assistive Devices/Equipment: None ADL Screening (condition at time of admission) Patient's cognitive ability adequate to safely complete daily activities?: No Is the patient deaf or have difficulty hearing?: No Does the patient have difficulty seeing, even when wearing glasses/contacts?: Yes Does the patient have difficulty concentrating, remembering, or making decisions?: Yes Patient able to express need for assistance with ADLs?: No Does the patient have difficulty dressing or bathing?: Yes Independently performs ADLs?: No Does the patient have difficulty walking or climbing stairs?: Yes Weakness of Legs: Both Weakness of Arms/Hands: Both  Permission Sought/Granted                  Emotional Assessment              Admission diagnosis:  Urinary tract infection with hematuria, site unspecified [N39.0, R31.9] Altered mental status, unspecified altered mental status type [R41.82] Acute metabolic encephalopathy [G93.41] COVID [U07.1] COVID-19 [U07.1] Patient Active Problem List   Diagnosis Date Noted   COVID-19 07/22/2022   Acute cystitis with hematuria 05/25/2022   Acute metabolic  encephalopathy 05/25/2022   Sepsis due to Escherichia coli (E. coli) (HCC) 05/24/2022   Insomnia 05/23/2022   Hyperlipidemia 05/23/2022   Acute kidney injury superimposed on chronic kidney disease (HCC)    Elevated troponin    Chronic kidney disease, stage 3b (HCC) 09/07/2020   Dementia without behavioral disturbance (HCC) 09/07/2020   NSTEMI (non-ST elevated myocardial infarction) (HCC) 09/07/2020   Weakness 09/07/2020   Essential hypertension 04/23/2018   PCP:  Marisue Ivan, MD Pharmacy:   [facility] [address] ([address]), [place] - [address] [place]) [place] [address] Phone: [phone] Fax: [phone]     Social Determinants of Health (SDOH) Interventions    Readmission Risk Interventions     No data to display

## 2022-07-22 NOTE — H&P (Signed)
History and Physical    Yolanda Moore [id number] DOB: 12/02/1945 DOA: 07/22/2022  PCP: Dion Body, MD   Patient coming from: ILF  Chief Complaint: Confusion  HPI: Yolanda Moore is a 76 y.o. female with medical history significant of hypertension, hyperlipidemia, dementia, CKD stage IIIb, coronary artery disease who was brought from independent living facility with complaint of confusion.  Her daughter went to see her this morning and she was found to be very weak, confused more than her baseline.  She was not responding that much.  She was then brought to the Emergency Department. She has history of dementia.  As per the daughter, she is intermittently confused but not confused at this level.  She lives at independent facility by herself.  She walks without any problem. Patient seen and examined at the bedside in the ED.  During my evaluation, she was hemodynamically stable, on room air, not in any kind of respiratory distress.  She was confused, not agitated.  She answers yes to every question.  Most of the information was taken from the daughter on phone.  ED Course: She was overall hemodynamically stable presentation, mildly febrile.  Blood pressure was stable.  She was saturating fine on room air.  Lab work showed creatinine of 1.2, mildly elevated troponin, elevated BNP at 362.  UA showed leukocytes of 21-50, many bacteria.  COVID screen test noted positive.  Chest x-ray did not show any pneumonia or pulmonary edema but showed perivascular hilar fullness. Patient admitted for the management of acute metabolic encephalopathy, secondary to COVID/concurrent UTI.  Review of Systems: As per HPI otherwise 10 point review of systems negative.    Past Medical History:  Diagnosis Date   Ankle fracture, right 04/19/2020   Chronic kidney disease    Was told many yrs ago that 1 kidney doesn't work   Dementia ([place])    HTN (hypertension)    MI (myocardial infarction) ([place]) 09/07/2020    Wears dentures    Partial lower    Past Surgical History:  Procedure Laterality Date   ABDOMINAL HYSTERECTOMY     CATARACT EXTRACTION W/PHACO Right 02/08/2021   Procedure: CATARACT EXTRACTION PHACO AND INTRAOCULAR LENS PLACEMENT ([place]) RIGHT;  Surgeon: Eulogio Bear, MD;  Location: [facility];  Service: Ophthalmology;  Laterality: Right;  3.32 0:28.4   CATARACT EXTRACTION W/PHACO Left 02/22/2021   Procedure: CATARACT EXTRACTION PHACO AND INTRAOCULAR LENS PLACEMENT ([facility]) LEFT;  Surgeon: Eulogio Bear, MD;  Location: [facility];  Service: Ophthalmology;  Laterality: Left;  2.17 0:21.2     reports that she quit smoking about 33 years ago. She has never used smokeless tobacco. She reports that she does not currently use alcohol. She reports that she does not use drugs.  No Known Allergies  Family History  Problem Relation Age of Onset   Cancer Mother    Cancer Father    Ovarian cancer Daughter      Prior to Admission medications   Medication Sig Start Date End Date Taking? Authorizing Provider  aspirin 81 MG EC tablet Take 81 mg by mouth daily.    [provider]  atorvastatin (LIPITOR) 40 MG tablet Take 1 tablet (40 mg total) by mouth daily. 09/11/20 06/08/22  Wyvonnia Dusky, MD  carvedilol (COREG) 6.25 MG tablet Take 1 tablet (6.25 mg total) by mouth 2 (two) times daily with a meal. 09/10/20 06/08/22  Wyvonnia Dusky, MD  donepezil (ARICEPT) 10 MG tablet Take 10 mg by  mouth at bedtime. 04/10/20   [provider]  nitroGLYCERIN (NITROSTAT) 0.4 MG SL tablet Place 0.4 mg under the tongue every 5 (five) minutes as needed for chest pain.    [provider]  traZODone (DESYREL) 50 MG tablet Take 50 mg by mouth at bedtime as needed. 12/30/21   [provider]    Physical Exam: Vitals:   07/22/22 0959  BP: 138/73  Pulse: 72  Resp: 18  Temp: 100.2 F (37.9 C)  TempSrc: Oral  SpO2: 95%    Constitutional: Confused, calm,  comfortable Vitals:   07/22/22 0959  BP: 138/73  Pulse: 72  Resp: 18  Temp: 100.2 F (37.9 C)  TempSrc: Oral  SpO2: 95%   Eyes: PERRL, lids and conjunctivae normal ENMT: Mucous membranes are moist.  Neck: normal, supple, no masses, no thyromegaly Respiratory: Mild crackles on the left lung, normal respiratory effort. No accessory muscle use.  Cardiovascular: Regular rate and rhythm, no murmurs / rubs / gallops. No extremity edema.  Abdomen: no tenderness, no masses palpated. No hepatosplenomegaly. Bowel sounds positive.  Musculoskeletal: no clubbing / cyanosis. No joint deformity upper and lower extremities.  Skin: no rashes, lesions, ulcers. No induration Neurologic: CN 2-12 grossly intact.  Does not follow command Psychiatric: Normal judgment and insight. Alert and awake but confused Foley Catheter:None  Labs on Admission: I have personally reviewed following labs and imaging studies  CBC: Recent Labs  Lab 07/22/22 1011  WBC 5.0  NEUTROABS 3.4  HGB 13.1  HCT 40.9  MCV 85.0  PLT 317   Basic Metabolic Panel: Recent Labs  Lab 07/22/22 1011  NA 138  K 4.0  CL 105  CO2 26  GLUCOSE 108*  BUN 12  CREATININE 1.26*  CALCIUM 9.4   GFR: CrCl cannot be calculated (Unknown ideal weight.). Liver Function Tests: Recent Labs  Lab 07/22/22 1011  AST 32  ALT 34  ALKPHOS 56  BILITOT 0.6  PROT 7.4  ALBUMIN 3.5   No results for input(s): "LIPASE", "AMYLASE" in the last 168 hours. No results for input(s): "AMMONIA" in the last 168 hours. Coagulation Profile: No results for input(s): "INR", "PROTIME" in the last 168 hours. Cardiac Enzymes: No results for input(s): "CKTOTAL", "CKMB", "CKMBINDEX", "TROPONINI" in the last 168 hours. BNP (last 3 results) No results for input(s): "PROBNP" in the last 8760 hours. HbA1C: No results for input(s): "HGBA1C" in the last 72 hours. CBG: No results for input(s): "GLUCAP" in the last 168 hours. Lipid Profile: No results for  input(s): "CHOL", "HDL", "LDLCALC", "TRIG", "CHOLHDL", "LDLDIRECT" in the last 72 hours. Thyroid Function Tests: No results for input(s): "TSH", "T4TOTAL", "FREET4", "T3FREE", "THYROIDAB" in the last 72 hours. Anemia Panel: No results for input(s): "VITAMINB12", "FOLATE", "FERRITIN", "TIBC", "IRON", "RETICCTPCT" in the last 72 hours. Urine analysis:    Component Value Date/Time   COLORURINE YELLOW (A) 07/22/2022 1008   APPEARANCEUR HAZY (A) 07/22/2022 1008   LABSPEC 1.021 07/22/2022 1008   PHURINE 5.0 07/22/2022 1008   GLUCOSEU NEGATIVE 07/22/2022 1008   HGBUR NEGATIVE 07/22/2022 1008   BILIRUBINUR NEGATIVE 07/22/2022 1008   KETONESUR NEGATIVE 07/22/2022 1008   PROTEINUR NEGATIVE 07/22/2022 1008   NITRITE NEGATIVE 07/22/2022 1008   LEUKOCYTESUR SMALL (A) 07/22/2022 1008    Radiological Exams on Admission: DG Chest Portable 1 View  Result Date: 07/22/2022 CLINICAL DATA:  weakness EXAM: PORTABLE CHEST 1 VIEW COMPARISON:  June 08, 2022 FINDINGS: The cardiomediastinal silhouette is unchanged in contour.Atherosclerotic calcifications. Perihilar vascular fullness. No pleural effusion.  No pneumothorax. No acute pleuroparenchymal abnormality. Visualized abdomen is unremarkable. IMPRESSION: Perihilar vascular fullness without overt edema. Electronically Signed   By: Meda Klinefelter M.D.   On: 07/22/2022 10:38     Assessment/Plan Principal Problem:   Acute metabolic encephalopathy Active Problems:   Essential hypertension   Chronic kidney disease, stage 3b (HCC)   Dementia without behavioral disturbance (HCC)   Weakness   Hyperlipidemia   COVID-19   Altered mental status: Likely from metabolic encephalopathy with COVID, possible UTI.  Patient has history of dementia.  Intermittently confused at baseline.   Delirium precautions.  Frequent reorientation.  COVID illness: Not in respiratory distress.  Saturating fine on room air.  Mildly febrile on presentation.  Not hypoxic so not  started on steroids.  Started on Paxlovid.  Monitor inflammatory markers  Suspected UTI: Urinalysis shows leukocytes, bacteria.  Started on ceftriaxone, urine culture sent  Chronic diastolic CHF: Last echo done on 09/2020 showed EF of 65 to 70%, grade 2 diastolic dysfunction.  BNP elevated, she has mild crackles on the left side of the lung.  We will give a dose of IV Lasix 20 mg once.  We will assess her volume status again tomorrow  History of coronary artery disease: No report of chest pain.  Takes Coreg, Lipitor, aspirin  Hypertension: Currently BP stable.  Continue Coreg  CKD stage IIIb: Currently kidney function at baseline.  Monitor  Hyperlipidemia: On Lipitor  Insomnia: On trazodone  Weakness/debility: Lives at independent living facility.  Very weak at the moment.  We will consult PT and OT, she might need SNF      Severity of Illness: The appropriate patient status for this patient is OBSERVATION.   DVT prophylaxis: Lovenox Code Status: DNR Family Communication: called and discussed   with daughter on phone  Consults called: None     Burnadette Pop MD [facility]  07/22/2022, 1:02 PM

## 2022-07-23 ENCOUNTER — Inpatient Hospital Stay: Payer: Medicare PPO

## 2022-07-23 ENCOUNTER — Observation Stay: Payer: Medicare PPO

## 2022-07-23 DIAGNOSIS — N390 Urinary tract infection, site not specified: Secondary | ICD-10-CM | POA: Diagnosis present

## 2022-07-23 DIAGNOSIS — I509 Heart failure, unspecified: Secondary | ICD-10-CM

## 2022-07-23 DIAGNOSIS — E876 Hypokalemia: Secondary | ICD-10-CM | POA: Diagnosis present

## 2022-07-23 DIAGNOSIS — R0602 Shortness of breath: Secondary | ICD-10-CM | POA: Diagnosis present

## 2022-07-23 DIAGNOSIS — R3989 Other symptoms and signs involving the genitourinary system: Secondary | ICD-10-CM

## 2022-07-23 DIAGNOSIS — I130 Hypertensive heart and chronic kidney disease with heart failure and stage 1 through stage 4 chronic kidney disease, or unspecified chronic kidney disease: Secondary | ICD-10-CM | POA: Diagnosis present

## 2022-07-23 DIAGNOSIS — F0390 Unspecified dementia without behavioral disturbance: Secondary | ICD-10-CM

## 2022-07-23 DIAGNOSIS — J9601 Acute respiratory failure with hypoxia: Secondary | ICD-10-CM

## 2022-07-23 DIAGNOSIS — U071 COVID-19: Secondary | ICD-10-CM

## 2022-07-23 DIAGNOSIS — E785 Hyperlipidemia, unspecified: Secondary | ICD-10-CM | POA: Diagnosis present

## 2022-07-23 DIAGNOSIS — R5381 Other malaise: Secondary | ICD-10-CM | POA: Diagnosis present

## 2022-07-23 DIAGNOSIS — R4182 Altered mental status, unspecified: Secondary | ICD-10-CM | POA: Diagnosis not present

## 2022-07-23 DIAGNOSIS — N1832 Chronic kidney disease, stage 3b: Secondary | ICD-10-CM

## 2022-07-23 DIAGNOSIS — I10 Essential (primary) hypertension: Secondary | ICD-10-CM

## 2022-07-23 DIAGNOSIS — J1282 Pneumonia due to coronavirus disease 2019: Secondary | ICD-10-CM | POA: Diagnosis present

## 2022-07-23 DIAGNOSIS — G9341 Metabolic encephalopathy: Secondary | ICD-10-CM | POA: Diagnosis present

## 2022-07-23 DIAGNOSIS — E870 Hyperosmolality and hypernatremia: Secondary | ICD-10-CM | POA: Diagnosis present

## 2022-07-23 DIAGNOSIS — I5033 Acute on chronic diastolic (congestive) heart failure: Secondary | ICD-10-CM | POA: Diagnosis present

## 2022-07-23 DIAGNOSIS — Z90710 Acquired absence of both cervix and uterus: Secondary | ICD-10-CM | POA: Diagnosis not present

## 2022-07-23 DIAGNOSIS — I5032 Chronic diastolic (congestive) heart failure: Secondary | ICD-10-CM | POA: Diagnosis present

## 2022-07-23 DIAGNOSIS — Z8041 Family history of malignant neoplasm of ovary: Secondary | ICD-10-CM | POA: Diagnosis not present

## 2022-07-23 DIAGNOSIS — Z7982 Long term (current) use of aspirin: Secondary | ICD-10-CM | POA: Diagnosis not present

## 2022-07-23 DIAGNOSIS — Z79899 Other long term (current) drug therapy: Secondary | ICD-10-CM | POA: Diagnosis not present

## 2022-07-23 DIAGNOSIS — Z87891 Personal history of nicotine dependence: Secondary | ICD-10-CM | POA: Diagnosis not present

## 2022-07-23 DIAGNOSIS — Z66 Do not resuscitate: Secondary | ICD-10-CM | POA: Diagnosis present

## 2022-07-23 DIAGNOSIS — I2510 Atherosclerotic heart disease of native coronary artery without angina pectoris: Secondary | ICD-10-CM | POA: Diagnosis present

## 2022-07-23 DIAGNOSIS — A0472 Enterocolitis due to Clostridium difficile, not specified as recurrent: Secondary | ICD-10-CM | POA: Diagnosis present

## 2022-07-23 DIAGNOSIS — G4700 Insomnia, unspecified: Secondary | ICD-10-CM | POA: Diagnosis present

## 2022-07-23 DIAGNOSIS — F03918 Unspecified dementia, unspecified severity, with other behavioral disturbance: Secondary | ICD-10-CM | POA: Diagnosis present

## 2022-07-23 DIAGNOSIS — I252 Old myocardial infarction: Secondary | ICD-10-CM | POA: Diagnosis not present

## 2022-07-23 LAB — BASIC METABOLIC PANEL
Anion gap: 10 (ref 5–15)
BUN: 15 mg/dL (ref 8–23)
CO2: 24 mmol/L (ref 22–32)
Calcium: 9.5 mg/dL (ref 8.9–10.3)
Chloride: 104 mmol/L (ref 98–111)
Creatinine, Ser: 1.23 mg/dL — ABNORMAL HIGH (ref 0.44–1.00)
GFR, Estimated: 46 mL/min — ABNORMAL LOW (ref >60)
Glucose, Bld: 118 mg/dL — ABNORMAL HIGH (ref 70–99)
Potassium: 4 mmol/L (ref 3.5–5.1)
Sodium: 138 mmol/L (ref 135–145)

## 2022-07-23 LAB — BLOOD GAS, ARTERIAL
Acid-Base Excess: 2.8 mmol/L — ABNORMAL HIGH (ref 0.0–2.0)
Bicarbonate: 28.5 mmol/L — ABNORMAL HIGH (ref 20.0–28.0)
O2 Content: 4 L/min
O2 Saturation: 90.4 %
Patient temperature: 37
pCO2 arterial: 47 mmHg (ref 32–48)
pH, Arterial: 7.39 (ref 7.35–7.45)
pO2, Arterial: 61 mmHg — ABNORMAL LOW (ref 83–108)

## 2022-07-23 LAB — CBC
HCT: 45.6 % (ref 36.0–46.0)
Hemoglobin: 14.2 g/dL (ref 12.0–15.0)
MCH: 27.1 pg (ref 26.0–34.0)
MCHC: 31.1 g/dL (ref 30.0–36.0)
MCV: 87 fL (ref 80.0–100.0)
Platelets: 302 10*3/uL (ref 150–400)
RBC: 5.24 MIL/uL — ABNORMAL HIGH (ref 3.87–5.11)
RDW: 14.8 % (ref 11.5–15.5)
WBC: 11.1 10*3/uL — ABNORMAL HIGH (ref 4.0–10.5)
nRBC: 0 % (ref 0.0–0.2)

## 2022-07-23 LAB — C-REACTIVE PROTEIN: CRP: 2.8 mg/dL — ABNORMAL HIGH (ref <1.0)

## 2022-07-23 LAB — GLUCOSE, CAPILLARY
Glucose-Capillary: 149 mg/dL — ABNORMAL HIGH (ref 70–99)
Glucose-Capillary: 157 mg/dL — ABNORMAL HIGH (ref 70–99)
Glucose-Capillary: 136 mg/dL — ABNORMAL HIGH (ref 70–99)

## 2022-07-23 LAB — MRSA NEXT GEN BY PCR, NASAL: MRSA by PCR Next Gen: DETECTED — AB

## 2022-07-23 MED ORDER — MUPIROCIN 2 % EX OINT
1.0000 | TOPICAL_OINTMENT | Freq: Two times a day (BID) | CUTANEOUS | Status: AC
  Administered 2022-07-23 – 2022-07-27 (×10): 1 via NASAL
  Filled 2022-07-23: qty 22

## 2022-07-23 MED ORDER — CHLORHEXIDINE GLUCONATE CLOTH 2 % EX PADS: 6.0000 | MEDICATED_PAD | Freq: Every day | CUTANEOUS | Status: DC

## 2022-07-23 MED ORDER — IPRATROPIUM-ALBUTEROL 0.5-2.5 (3) MG/3ML IN SOLN
3.0000 mL | Freq: Four times a day (QID) | RESPIRATORY_TRACT | Status: DC
  Administered 2022-07-23 – 2022-07-24 (×5): 3 mL via RESPIRATORY_TRACT
  Filled 2022-07-23 (×5): qty 3

## 2022-07-23 MED ORDER — GLYCOPYRROLATE 0.2 MG/ML IJ SOLN: 0.4000 mg | Freq: Four times a day (QID) | INTRAMUSCULAR | Status: DC | PRN

## 2022-07-23 MED ORDER — METHYLPREDNISOLONE SODIUM SUCC 125 MG IJ SOLR
80.0000 mg | Freq: Every day | INTRAMUSCULAR | Status: DC
  Administered 2022-07-23: 80 mg via INTRAVENOUS
  Filled 2022-07-23: qty 2

## 2022-07-23 MED ORDER — ORAL CARE MOUTH RINSE: 15.0000 mL | OROMUCOSAL | Status: DC | PRN

## 2022-07-23 MED ORDER — FUROSEMIDE 10 MG/ML IJ SOLN
40.0000 mg | Freq: Once | INTRAMUSCULAR | Status: AC
  Administered 2022-07-23: 40 mg via INTRAVENOUS
  Filled 2022-07-23: qty 4

## 2022-07-23 MED ORDER — PIPERACILLIN-TAZOBACTAM 3.375 G IVPB
3.3750 g | Freq: Three times a day (TID) | INTRAVENOUS | Status: DC
  Administered 2022-07-23 – 2022-07-24 (×3): 3.375 g via INTRAVENOUS
  Filled 2022-07-23 (×3): qty 50

## 2022-07-23 MED ORDER — ORAL CARE MOUTH RINSE
15.0000 mL | OROMUCOSAL | Status: DC
  Administered 2022-07-24 – 2022-08-02 (×31): 15 mL via OROMUCOSAL

## 2022-07-23 MED ORDER — ACETYLCYSTEINE 20 % IN SOLN
3.0000 mL | Freq: Two times a day (BID) | RESPIRATORY_TRACT | Status: DC
  Administered 2022-07-23 – 2022-07-24 (×4): 3 mL via ORAL
  Filled 2022-07-23 (×6): qty 4

## 2022-07-23 MED ORDER — SODIUM CHLORIDE 0.9 % IV SOLN
100.0000 mg | Freq: Every day | INTRAVENOUS | Status: AC
  Administered 2022-07-24 – 2022-07-25 (×2): 100 mg via INTRAVENOUS
  Filled 2022-07-23: qty 100
  Filled 2022-07-23: qty 20

## 2022-07-23 MED ORDER — ASPIRIN 300 MG RE SUPP
300.0000 mg | Freq: Every day | RECTAL | Status: DC
  Administered 2022-07-24: 300 mg via RECTAL
  Filled 2022-07-23 (×2): qty 1

## 2022-07-23 MED ORDER — SODIUM CHLORIDE 0.9 % IV SOLN
200.0000 mg | Freq: Once | INTRAVENOUS | Status: AC
  Administered 2022-07-23: 200 mg via INTRAVENOUS
  Filled 2022-07-23: qty 40

## 2022-07-23 NOTE — Plan of Care (Signed)

## 2022-07-23 NOTE — Consult Note (Addendum)
Remdesivir - Pharmacy Brief Note   O: COVID-19(+); Hypoxic requiring supplemental oxygen Pt originally started on Paxlovid, but AMS and gurgling, copious secretions preventing tolerance and absorption of oral medications ALT: 34 CXR: No active disease SpO2: 94% on 4 L [place]   A/P:  Remdesivir 200 mg IVPB once followed by 100 mg IVPB daily x 3 days.   Dara Hoyer, PharmD PGY-1 Pharmacy Resident 07/23/2022 2:00 PM

## 2022-07-23 NOTE — Progress Notes (Signed)
Progress Note   Patient: Yolanda Moore [id number] DOB: [birth year] DOA: 07/22/2022     0 DOS: the patient was seen and examined on 07/23/2022     Assessment and Plan: * Acute respiratory failure with hypoxia (HCC) Pulse ox 77% on room air.  I was called to see patient's secondary to gurgling on secretions.  She was drinking some water earlier.  Needed to be suctioned quite a bit.  Repeat chest x-ray negative.  Make n.p.o. today.  We will get speech therapy evaluation tomorrow.  Glycopyrrolate as needed secretions.  Oxygen supplementation.  Confirmed DNR status with patient's daughter.  Solu-Medrol started.  COVID-19 COVID-19 infection.  With patient gurgling on secretions and unable to take Paxlovid,  I will get rid of this medication.  Started Solu-Medrol secondary to hypoxia  Acute metabolic encephalopathy Patient able to follow commands and lift up her arms and legs off the bed to command.  Essential hypertension Coreg if able to take  Suspected UTI Follow-up urine culture.  On empiric Rocephin  Hyperlipidemia Lipitor if able to take  Dementia without behavioral disturbance (HCC) Hold Aricept  Chronic kidney disease, stage 3b (HCC) Creatinine 1.23 and current GFR 46   I do not think this is congestive heart failure but I did give a dose of Lasix.  Repeat chest x-ray read as negative.     Subjective: Called to see patient secondary to gurgling on secretions.  Needed to be suctioned multiple times and pulse ox was 77% on room air.  Patient trying to talk but gurgling.  Patient able to follow commands lifting arms and legs off the bed.  Admitted with altered mental status and COVID infection.  Physical Exam: Vitals:   07/22/22 2151 07/23/22 0448 07/23/22 0751 07/23/22 1222  BP: (!) 122/55 (!) 158/74 (!) 168/81 139/77  Pulse: 65 70 72 64  Resp: 17 17  (!) 22  Temp: 99.8 F (37.7 C) 99.1 F (37.3 C) 99.3 F (37.4 C) 99.1 F (37.3 C)  TempSrc:    Oral  SpO2: 97% 96%  96% 100%  Weight:    65.2 kg   Physical Exam HENT:     Head: Normocephalic.     Mouth/Throat:     Pharynx: No oropharyngeal exudate.  Eyes:     General: Lids are normal.     Conjunctiva/sclera: Conjunctivae normal.  Cardiovascular:     Rate and Rhythm: Normal rate and regular rhythm.     Heart sounds: Normal heart sounds, S1 normal and S2 normal.  Pulmonary:     Breath sounds: Transmitted upper airway sounds present. No decreased breath sounds, wheezing, rhonchi or rales.     Comments: Gurgling on secretions Abdominal:     Palpations: Abdomen is soft.     Tenderness: There is no abdominal tenderness.  Musculoskeletal:     Right lower leg: No swelling.     Left lower leg: No swelling.  Skin:    General: Skin is warm.     Findings: No rash.  Neurological:     Mental Status: She is alert.     Comments: Able to straight leg raise bilaterally and hold arms up in the air.     Data Reviewed: BNP 362.9, creatinine 1.23, hemoglobin 14.2, white blood cell count 11.1  Family Communication: Spoke with daughter on the phone  Disposition: Status is: Changed to inpatient secondary to transferring to the stepdown unit with worsening respiratory status. Planned Discharge Destination: To be determined based on clinical course  Time spent: 35 minutes  Author: Loletha Grayer, MD 07/23/2022 12:52 PM  For on call review [email or web address].

## 2022-07-23 NOTE — Progress Notes (Signed)
PT Cancellation Note  Patient Details Name: Yolanda Moore MRN: [id number] DOB: September 20, 1946   Cancelled Treatment:    Reason Eval/Treat Not Completed: Medical issues which prohibited therapy (Patient noted with transfer to CCU due to decline in respiratory status. Per guidelines, will require new orders to resume PT after transfer to higher level of ccare.  Please re-consult as medically appropriate.)   Rithika Seel H. Owens Shark, PT, DPT, NCS 07/23/22, 8:55 PM [phone]

## 2022-07-23 NOTE — H&P (Deleted)
Remdesivir - Pharmacy Brief Note   O: COVID-19(+); Hypoxic requiring supplemental oxygen ALT: 34 CXR: No active disease SpO2: 94% on 4 L [place]   A/P:  Remdesivir 200 mg IVPB once followed by 100 mg IVPB daily x 3 days.   Dara Hoyer, PharmD PGY-1 Pharmacy Resident 07/23/2022 2:00 PM

## 2022-07-23 NOTE — Consult Note (Signed)
NAME:  Yolanda Moore, MRN:  [id number], DOB:  1946/08/17, LOS: 0 ADMISSION DATE:  07/22/2022, CONSULTATION DATE:  07/23/2022 REFERRING MD:  Alford Highland, MD, CHIEF COMPLAINT:  Hypoxia   History of Present Illness:   Yolanda Moore is a 76 year old female with a history of dementia who was admitted from her independent living facility with the chief complaint of confusion. She was found to be weak and more confused that usual by her daughter, and brought in to the ED for an evaluation.  She has a history of dementia but lives somewhat independently at her facility. Workup in the ED was notable for a mildly elevated troponin, BNP of 362, a positive UA and a positive covid screen. CXR showed increased interstitial markings.  She was admitted to the floor for management, and appears to have aspirated while on the medical ward. Following aspiration, she developed hypoxia requiring oxygen supplementation and transfer to the ICU for higher level of care.  On my evaluation, the patient was confused and mumbling incomprehensible words. She was on 2 liters of nasal cannula and saturating at 90%, this was increased to 4 liters and her saturation improved to 96-98%.  Critical care medicine was consulted for co-management of acute hypoxic respiratory failure.  Pertinent  Medical History  #HTN #HLD #Dementia #CKD #CAD  Significant Hospital Events: Including procedures, antibiotic start and stop dates in addition to other pertinent events   9/15: admission 9/16: witnessed aspiration by nursing staff  Interim History / Subjective:  Patient is confused, unable to answer questions appropriately.   Objective   Blood pressure (!) 107/56, pulse 62, temperature 99.1 F (37.3 C), temperature source Oral, resp. rate 17, weight 65.2 kg, SpO2 94 %.        Intake/Output Summary (Last 24 hours) at 07/23/2022 1332 Last data filed at 07/22/2022 1528 Gross per 24 hour  Intake 100 ml  Output --  Net 100 ml    Filed Weights   07/22/22 1400 07/23/22 1222  Weight: 69.9 kg 65.2 kg    Examination: Physical Exam Constitutional:      General: She is not in acute distress.    Appearance: She is ill-appearing.  HENT:     Mouth/Throat:     Mouth: Mucous membranes are dry.  Eyes:     Pupils: Pupils are equal, round, and reactive to light.  Cardiovascular:     Rate and Rhythm: Normal rate and regular rhythm.     Heart sounds: Normal heart sounds.  Pulmonary:     Effort: Pulmonary effort is normal.     Breath sounds: Rhonchi and rales (over the right upper lung field) present.  Abdominal:     General: There is no distension.     Palpations: Abdomen is soft.  Musculoskeletal:        General: Normal range of motion.     Cervical back: Normal range of motion.  Skin:    General: Skin is warm.  Neurological:     Mental Status: She is alert. She is disoriented.     Motor: Weakness present.     Resolved Hospital Problem list     Assessment & Plan:  76 year old female with a history of CKD and dementia who presented with altered mental status and found to have a positive COVID test. She developed hypoxia following an aspiration event and transferred to the ICU.  Neurology #Toxic Metabolic Encephalopathy #Dementia  On donepezil at home, presented with worsening altered mental status.  Suspect an element of toxic metabolic encephalopathy due to acute illness. CO2 narcosis is also on the differential, will obtain an ABG to assess. Holding sedating medications while she improves.  -hold sedating medications  Respiratory #Acute Hypoxic Respiratory Failure #Aspiration Pneumonia vs. Pneumonitis #COVID-19 positive  Developed hypoxia shortly after aspiration event, suspect aspiration pneumonitis but can not rule out aspiration pneumonia, especially given her mental status and propensity for continued aspiration. She improved with oxygen supplementation with nasal cannula. CXR with hilar  fullness and increased interstitial markings yesterday, and repeat today shows similar interstitial markings with increase in the right upper lung field. Patient is not having signs of COVID pneumonia and I don't suspect it is contributing at the moment. That said, she is not a candidate for paxlovid given inability to take PO. Would not recommend steroids as she doesn't have a history of asthma/COPD, nor is she wheezing. Will consider an evaluation for pulmonary embolism should her oxygenation not improve.  -d/c steroids -repeat CXR -consider evaluation for PE  Cardiovascular #Acute Decompensated HFpEF  Has a history of HFpEF and diastolic dysfunction based on TTE from 2021. Does have an elevated BNP on presentation and exam also shows lower extremity edema. Agree with gentle diuresis given edema, BNP elevation, and increased interstitial markings  Renal #CKD  At baseline, avoid nephrotoxic medications.  GI  Keep NPO  Renal #CKD  Off nephrotoxic medications. Received a one time dose of furosemide (60 mg total) today. Agree with gentle diuresis with a goal balance of -500 mL to - 1L  -accurate I/O -monitor electrolytes  Hem/Onc  On DVT prophylaxis  ID #COVID-19 #Aspiration PNA vs. Pneumonitis  Witnessed aspiration followed by hypoxia in the setting of COVID-19. She has received her COVID-19 vaccines (x3). Given she is NPO and not able to take paxlovid, and as such would recommend Remdesivir IV. Patient not a candidate for Barcitinib or Tocilizumab. I would not recommend steroids at the time being given recent aspiration and lack of imaging consistent with COVID-19 pneumonia. Would consider antibiotics for HAP given aspiration.  -MRSA screen -will initiate Zosyn   Labs   CBC: Recent Labs  Lab 07/22/22 1011 07/23/22 0519  WBC 5.0 11.1*  NEUTROABS 3.4  --   HGB 13.1 14.2  HCT 40.9 45.6  MCV 85.0 87.0  PLT 317 867    Basic Metabolic Panel: Recent Labs  Lab  07/22/22 1011 07/23/22 0519  NA 138 138  K 4.0 4.0  CL 105 104  CO2 26 24  GLUCOSE 108* 118*  BUN 12 15  CREATININE 1.26* 1.23*  CALCIUM 9.4 9.5   GFR: Estimated Creatinine Clearance: 33.3 mL/min (A) (by C-G formula based on SCr of 1.23 mg/dL (H)). Recent Labs  Lab 07/22/22 1010 07/22/22 1011 07/22/22 1236 07/23/22 0519  WBC  --  5.0  --  11.1*  LATICACIDVEN 1.1  --  1.2  --     Liver Function Tests: Recent Labs  Lab 07/22/22 1011  AST 32  ALT 34  ALKPHOS 56  BILITOT 0.6  PROT 7.4  ALBUMIN 3.5   No results for input(s): "LIPASE", "AMYLASE" in the last 168 hours. No results for input(s): "AMMONIA" in the last 168 hours.  ABG No results found for: "PHART", "PCO2ART", "PO2ART", "HCO3", "TCO2", "ACIDBASEDEF", "O2SAT"   Coagulation Profile: No results for input(s): "INR", "PROTIME" in the last 168 hours.  Cardiac Enzymes: No results for input(s): "CKTOTAL", "CKMB", "CKMBINDEX", "TROPONINI" in the last 168 hours.  HbA1C: No results found  for: "HGBA1C"  CBG: No results for input(s): "GLUCAP" in the last 168 hours.  Review of Systems:   Unable to obtain  Past Medical History:  She,  has a past medical history of Ankle fracture, right (04/19/2020), Chronic kidney disease, Dementia (HCC), HTN (hypertension), MI (myocardial infarction) (HCC) (09/07/2020), and Wears dentures.   Surgical History:   Past Surgical History:  Procedure Laterality Date   ABDOMINAL HYSTERECTOMY     CATARACT EXTRACTION W/PHACO Right 02/08/2021   Procedure: CATARACT EXTRACTION PHACO AND INTRAOCULAR LENS PLACEMENT ([facility]) RIGHT;  Surgeon: Nevada Crane, MD;  Location: [facility];  Service: Ophthalmology;  Laterality: Right;  3.32 0:28.4   CATARACT EXTRACTION W/PHACO Left 02/22/2021   Procedure: CATARACT EXTRACTION PHACO AND INTRAOCULAR LENS PLACEMENT ([facility]) LEFT;  Surgeon: Nevada Crane, MD;  Location: [facility];  Service: Ophthalmology;  Laterality: Left;   2.17 0:21.2     Social History:   reports that she quit smoking about 33 years ago. She has never used smokeless tobacco. She reports that she does not currently use alcohol. She reports that she does not use drugs.   Family History:  Her family history includes Cancer in her father and mother; Ovarian cancer in her daughter.   Allergies No Known Allergies   Home Medications  Prior to Admission medications   Medication Sig Start Date End Date Taking? Authorizing Provider  aspirin 81 MG EC tablet Take 81 mg by mouth daily.   Yes [provider]  atorvastatin (LIPITOR) 40 MG tablet Take 1 tablet (40 mg total) by mouth daily. 09/11/20 07/22/22 Yes Charise Killian, MD  carvedilol (COREG) 6.25 MG tablet Take 1 tablet (6.25 mg total) by mouth 2 (two) times daily with a meal. 09/10/20 07/22/22 Yes Charise Killian, MD  donepezil (ARICEPT) 10 MG tablet Take 10 mg by mouth at bedtime. 04/10/20  Yes [provider]  losartan (COZAAR) 100 MG tablet Take 100 mg by mouth daily. 07/15/22  Yes [provider]  traZODone (DESYREL) 50 MG tablet Take 50 mg by mouth at bedtime as needed. 12/30/21  Yes [provider]  nitroGLYCERIN (NITROSTAT) 0.4 MG SL tablet Place 0.4 mg under the tongue every 5 (five) minutes as needed for chest pain.    [provider]     Critical care time: 60 minutes

## 2022-07-23 NOTE — Progress Notes (Signed)
PT Cancellation Note  Patient Details Name: Yolanda Moore MRN: [id number] DOB: Sep 27, 1946   Cancelled Treatment:    Reason Eval/Treat Not Completed: Fatigue/lethargy limiting ability to participate (Consult received and chart reviewed. Patient sleeping soundly upon arrival to room; unable to maintain alertness for participation with session despite max cuing/stimulation. Will re-attempt at later time/date as medically appropriate.)  [place] Dann H. Owens Shark, PT, DPT, NCS 07/23/22, 9:04 AM [phone]

## 2022-07-23 NOTE — Assessment & Plan Note (Addendum)
COVID-19 infection.  With patient gurgling on secretions and unable to take Paxlovid,  I will get rid of this medication.  Started Solu-Medrol secondary to hypoxia

## 2022-07-23 NOTE — Assessment & Plan Note (Addendum)
Pulse ox 77% on room air yesterday after likely aspiration episode.  Was on 4 L of oxygen with the aspiration episode.  Currently off oxygen at this point.  Switch to Augmentin for completion of course for aspiration episode and UTI.

## 2022-07-23 NOTE — Assessment & Plan Note (Signed)
Hold Aricept

## 2022-07-23 NOTE — Progress Notes (Signed)
In room providing care for pt. When she suddenly began coughing uncontrollably to the point she could not catch her breath. She began to panic and drool and was unable to manage her secretions so I began to use a Yankauer to provide oral suction. Pts. sats dropped to 78% so I added 4L of O2 to assist. She began to breath but was still unable to manage her secretions so the MD decided to transfer her to a higher level of care.

## 2022-07-23 NOTE — Assessment & Plan Note (Signed)
Low-dose Coreg

## 2022-07-23 NOTE — Assessment & Plan Note (Signed)
Follow-up urine culture.  On empiric Rocephin

## 2022-07-23 NOTE — Assessment & Plan Note (Signed)
Re-start Lipitor 

## 2022-07-23 NOTE — Progress Notes (Signed)
Spoke with patient's daughter [name] Capers on the phone.  She wants to reverse CODE STATUS from DNR to full code.  Since the patient is having some gurgling on secretions may end up needing intubation.  I will consult critical care team for evaluation.

## 2022-07-23 NOTE — Assessment & Plan Note (Signed)
Patient able to follow commands and lift up her arms and legs off the bed to command.

## 2022-07-23 NOTE — Progress Notes (Addendum)
OT Cancellation Note  Patient Details Name: Yolanda Moore MRN: [id number] DOB: Dec 17, 1945   Cancelled Treatment:    Reason Eval/Treat Not Completed: Patient not medically ready. Patient was transferred to the ICU today (9/16). Due to pt transferring to higher level of care, per OT protocol require new consult in order to continue therapy. Will complete current order. Please re-consult when pt is medically appropriate.   Doneta Public 07/23/2022, 3:07 PM

## 2022-07-23 NOTE — Consult Note (Signed)
Pharmacy Antibiotic Note  Yolanda Moore is a 76 y.o. female admitted on 07/22/2022 with  aspiration pneumonia .  Pharmacy has been consulted for Zosyn dosing.  Assessment: Pt is a 76 yo F with PMH hypertension, hyperlipidemia, dementia, CKD stage IIIb (BL Scr 1.1-1.3), coronary artery disease presenting with AMS, weakness. WBCs elevated at 11.1, afeb, and VSS. Pt is experiencing copious secretions (requiring glycopyrrolate) and unable to take most oral medications. Pt is COVID-19(+), MRSA PCR(+), and hypoxic, requiring O2 4 L [place]. Imaging negative for any acute processes.   Plan: Initiate Zosyn 3.375g IV q8H (4-hr infusion) Monitor WBC and vital signs to assess for efficacy of antibiotics Monitor renal function to assess for any necessary changes to Zosyn dosing  Weight: 65.2 kg (143 lb 11.8 oz)  Temp (24hrs), Avg:99.1 F (37.3 C), Min:98.5 F (36.9 C), Max:99.8 F (37.7 C)  Recent Labs  Lab 07/22/22 1010 07/22/22 1011 07/22/22 1236 07/23/22 0519  WBC  --  5.0  --  11.1*  CREATININE  --  1.26*  --  1.23*  LATICACIDVEN 1.1  --  1.2  --     Estimated Creatinine Clearance: 33.3 mL/min (A) (by C-G formula based on SCr of 1.23 mg/dL (H)).    No Known Allergies  Antimicrobials this admission: Zosyn 9/16 >> Remdesivir 9/16 >> Ceftriaxone 9/15 >> 9/16 Paxlovid 9/15 >> 9/16  Microbiology results: 9/15 UCx: collected  9/16 MRSA PCR: positive  Thank you for allowing pharmacy to be a part of this patient's care.  Dara Hoyer, PharmD PGY-1 Pharmacy Resident 07/23/2022 2:21 PM

## 2022-07-23 NOTE — Assessment & Plan Note (Addendum)
Creatinine 1.27 with a GFR 44

## 2022-07-24 DIAGNOSIS — J9601 Acute respiratory failure with hypoxia: Secondary | ICD-10-CM | POA: Diagnosis not present

## 2022-07-24 DIAGNOSIS — N390 Urinary tract infection, site not specified: Secondary | ICD-10-CM

## 2022-07-24 DIAGNOSIS — B952 Enterococcus as the cause of diseases classified elsewhere: Secondary | ICD-10-CM

## 2022-07-24 DIAGNOSIS — A0472 Enterocolitis due to Clostridium difficile, not specified as recurrent: Secondary | ICD-10-CM | POA: Diagnosis not present

## 2022-07-24 DIAGNOSIS — U071 COVID-19: Secondary | ICD-10-CM | POA: Diagnosis not present

## 2022-07-24 DIAGNOSIS — I5032 Chronic diastolic (congestive) heart failure: Secondary | ICD-10-CM

## 2022-07-24 DIAGNOSIS — R531 Weakness: Secondary | ICD-10-CM

## 2022-07-24 DIAGNOSIS — G9341 Metabolic encephalopathy: Secondary | ICD-10-CM | POA: Diagnosis not present

## 2022-07-24 LAB — CBC
HCT: 46.8 % — ABNORMAL HIGH (ref 36.0–46.0)
Hemoglobin: 14.8 g/dL (ref 12.0–15.0)
MCH: 27 pg (ref 26.0–34.0)
MCHC: 31.6 g/dL (ref 30.0–36.0)
MCV: 85.2 fL (ref 80.0–100.0)
Platelets: 313 10*3/uL (ref 150–400)
RBC: 5.49 MIL/uL — ABNORMAL HIGH (ref 3.87–5.11)
RDW: 14.6 % (ref 11.5–15.5)
WBC: 12.2 10*3/uL — ABNORMAL HIGH (ref 4.0–10.5)
nRBC: 0 % (ref 0.0–0.2)

## 2022-07-24 LAB — GLUCOSE, CAPILLARY
Glucose-Capillary: 133 mg/dL — ABNORMAL HIGH (ref 70–99)
Glucose-Capillary: 137 mg/dL — ABNORMAL HIGH (ref 70–99)

## 2022-07-24 LAB — C DIFFICILE QUICK SCREEN W PCR REFLEX
C Diff antigen: POSITIVE — AB
C Diff toxin: NEGATIVE

## 2022-07-24 LAB — PHOSPHORUS: Phosphorus: 3.5 mg/dL (ref 2.5–4.6)

## 2022-07-24 LAB — BASIC METABOLIC PANEL
Anion gap: 11 (ref 5–15)
BUN: 20 mg/dL (ref 8–23)
CO2: 24 mmol/L (ref 22–32)
Calcium: 9.1 mg/dL (ref 8.9–10.3)
Chloride: 104 mmol/L (ref 98–111)
Creatinine, Ser: 1.19 mg/dL — ABNORMAL HIGH (ref 0.44–1.00)
GFR, Estimated: 48 mL/min — ABNORMAL LOW (ref >60)
Glucose, Bld: 145 mg/dL — ABNORMAL HIGH (ref 70–99)
Potassium: 3.8 mmol/L (ref 3.5–5.1)
Sodium: 139 mmol/L (ref 135–145)

## 2022-07-24 LAB — C-REACTIVE PROTEIN: CRP: 7.9 mg/dL — ABNORMAL HIGH (ref <1.0)

## 2022-07-24 LAB — CLOSTRIDIUM DIFFICILE BY PCR, REFLEXED: Toxigenic C. Difficile by PCR: POSITIVE — AB

## 2022-07-24 LAB — MAGNESIUM: Magnesium: 1.9 mg/dL (ref 1.7–2.4)

## 2022-07-24 MED ORDER — STERILE WATER FOR INJECTION IJ SOLN
INTRAMUSCULAR | Status: AC
  Administered 2022-07-24: 10 mL
  Filled 2022-07-24: qty 10

## 2022-07-24 MED ORDER — ASPIRIN 81 MG PO TBEC
81.0000 mg | DELAYED_RELEASE_TABLET | Freq: Every day | ORAL | Status: DC
  Administered 2022-07-25 – 2022-07-27 (×3): 81 mg via ORAL
  Filled 2022-07-24 (×4): qty 1

## 2022-07-24 MED ORDER — FIDAXOMICIN 200 MG PO TABS
200.0000 mg | ORAL_TABLET | Freq: Two times a day (BID) | ORAL | Status: AC
  Administered 2022-07-24 – 2022-08-02 (×20): 200 mg via ORAL
  Filled 2022-07-24 (×20): qty 1

## 2022-07-24 MED ORDER — METHYLPREDNISOLONE SODIUM SUCC 40 MG IJ SOLR
40.0000 mg | Freq: Every day | INTRAMUSCULAR | Status: DC
  Administered 2022-07-24 – 2022-07-26 (×3): 40 mg via INTRAVENOUS
  Filled 2022-07-24 (×3): qty 1

## 2022-07-24 MED ORDER — SODIUM CHLORIDE 0.9 % IV SOLN
3.0000 g | Freq: Four times a day (QID) | INTRAVENOUS | Status: DC
  Administered 2022-07-24 – 2022-07-26 (×8): 3 g via INTRAVENOUS
  Filled 2022-07-24 (×3): qty 3
  Filled 2022-07-24: qty 8
  Filled 2022-07-24: qty 3
  Filled 2022-07-24 (×5): qty 8

## 2022-07-24 NOTE — Progress Notes (Signed)
   07/24/22 0926  Pre-Screen Questions- If "YES" to any of the following questions, STOP the screen, keep NPO, and place order for SLP eval and treat   Home diet required thickened liquids No  Trach tube present No  Radiation to Head/Neck No  Patient Readiness for Screen - If "YES" to any of the following questions, WAIT to screen, keep NPO  Is patient lethargic or unable to stay alert/awake? No  HOB restricted to <30 degrees No  NPO for planned procedure No  Brief Cognitive Screen- Aspiration Risk Assessment  Is patient oriented to name, place, or year? Yes  Able to open mouth, stick out tongue, or smile Yes  Oral Mechanism Exam  Able to seal lips Yes  Able to move tongue from side to side Yes  Face is symmetric Yes  Mandatory Oral Care Performed   Mandatory oral care performed Yes  3 oz Water Swallow Challenge  Does the patient stop drinking? No  Does the patient cough/choke? No  Screening Result Passed   Performed swallow screen at bedside per Dr. Leslye Peer request. [facility] greater than 45. Patient had no difficulty holding cup or swallowing water. O2 saturation remained the same during screening. Patient was given her po medication whole in applesauce with no difficulty. Dr. Leslye Peer notified via secure chat.

## 2022-07-24 NOTE — Assessment & Plan Note (Signed)
Rehab recommended by PT and OT

## 2022-07-24 NOTE — Assessment & Plan Note (Addendum)
Unasyn switched over to Augmentin.

## 2022-07-24 NOTE — Assessment & Plan Note (Signed)
Last EF 65 to 70%.  Patient received a dose of Lasix on admission and a dose of Lasix yesterday.  Hold off on further Lasix dosing.  Currently on Coreg

## 2022-07-24 NOTE — Evaluation (Signed)
Clinical/Bedside Swallow Evaluation Patient Details  Name: Yolanda Moore MRN: [id number] Date of Birth: 1945/12/08  Today's Date: 07/24/2022 Time: SLP Start Time (ACUTE ONLY): 1800 SLP Stop Time (ACUTE ONLY): 1820 SLP Time Calculation (min) (ACUTE ONLY): 20 min  Past Medical History:  Past Medical History:  Diagnosis Date   Ankle fracture, right 04/19/2020   Chronic kidney disease    Was told many yrs ago that 1 kidney doesn't work   Dementia ([place])    HTN (hypertension)    MI (myocardial infarction) ([place]) 09/07/2020   Wears dentures    Partial lower   Past Surgical History:  Past Surgical History:  Procedure Laterality Date   ABDOMINAL HYSTERECTOMY     CATARACT EXTRACTION W/PHACO Right 02/08/2021   Procedure: CATARACT EXTRACTION PHACO AND INTRAOCULAR LENS PLACEMENT ([place]) RIGHT;  Surgeon: Eulogio Bear, MD;  Location: [facility];  Service: Ophthalmology;  Laterality: Right;  3.32 0:28.4   CATARACT EXTRACTION W/PHACO Left 02/22/2021   Procedure: CATARACT EXTRACTION PHACO AND INTRAOCULAR LENS PLACEMENT ([facility]) LEFT;  Surgeon: Eulogio Bear, MD;  Location: [facility];  Service: Ophthalmology;  Laterality: Left;  2.17 0:21.2   HPI:  Per H&P: "Yolanda Moore is a 76 y.o. female with medical history significant of hypertension, hyperlipidemia, dementia, CKD stage IIIb, coronary artery disease who was brought from independent living facility with complaint of confusion.  Her daughter went to see her this morning and she was found to be very weak, confused more than her baseline.  She was not responding that much.  She was then brought to the Emergency Department She has history of dementia.  As per the daughter, she is intermittently confused but not confused at this level.  She lives at independent facility by herself.  She walks without any problem." Pt with aspiration event on 07/23/22 (suspect aspiration of secretions). Following aspiration, she developed hypoxia  requiring oxygen supplementation and transfer to the ICU for higher level of care. Pt now transferred back to floor status. CXR 07/23/22: "Mild cardiomegaly with mild, diffuse bilateral interstitial pulmonary opacity, suggesting edema or infection. No focal airspace opacity." Pt is on nectar thick liquids and Dys 1 (puree). Pt resting on 2L nasal canula during evaluation. Pt last seen by SLP service on 05/18/22, with recommendations for regular solids (chopped meat) and thin liquids.    Assessment / Plan / Recommendation  Clinical Impression  Pt seen for clinical swallow assessment in the setting of aspiration event on 9/16 and diagnoses of acute respiratory failure with hypoxia and COVID 19 with acute metabolic encephalopathy and dementia. Pt consuming pureed solids from tray upon therapist approach. Further trials completed of thin liquids via straw and advanced solids (soft solids consistent). No overt or subtle s/sx pharyngeal dysphagia noted. No change to vocal quality across trials. Oral phase mildly prolonged for oral transit and mastication. Pt with hx of impulsivity with solids, not observed with controlled trials and verbal cues for pacing. Oral phase (specifically attention to task/oral cavity and mastication time) impacted by cognition and dentition. Recommend initiation of thin liquids with mech soft solids. Aspiration precautions (slow rate, small bites, elevated HOB, liquid wash to facilitate oral clearance, minimize distractions, and alert for PO intake) advised. Supervision for compliance with aspiration precautions and set up for meals. SLP will continue to follow to ensure safety with PO intake and trial advanced solids. RN and MD aware of recommendations. SLP Visit Diagnosis: Dysphagia, oropharyngeal phase (R13.12)    Aspiration Risk  Mild  aspiration risk    Diet Recommendation   Dys 3 (mech soft); thin liquids  Medication Administration: Whole meds with liquid    Other   Recommendations Oral Care Recommendations: Oral care BID    Recommendations for follow up therapy are one component of a multi-disciplinary discharge planning process, led by the attending physician.  Recommendations may be updated based on patient status, additional functional criteria and insurance authorization.  Follow up Recommendations  (to be determined)      Assistance Recommended at Discharge Frequent or constant Supervision/Assistance  Functional Status Assessment Patient has had a recent decline in their functional status and demonstrates the ability to make significant improvements in function in a reasonable and predictable amount of time.  Frequency and Duration min 2x/week  1 week       Prognosis Prognosis for Safe Diet Advancement: Good Barriers to Reach Goals: Cognitive deficits      Swallow Study   General Date of Onset: 07/24/22 HPI: Per H&P: "Yolanda Moore is a 76 y.o. female with medical history significant of hypertension, hyperlipidemia, dementia, CKD stage IIIb, coronary artery disease who was brought from independent living facility with complaint of confusion.  Her daughter went to see her this morning and she was found to be very weak, confused more than her baseline.  She was not responding that much.  She was then brought to the Emergency Department She has history of dementia.  As per the daughter, she is intermittently confused but not confused at this level.  She lives at independent facility by herself.  She walks without any problem." Pt with aspiration event on 07/23/22 (suspect aspiration of secretions). Following aspiration, she developed hypoxia requiring oxygen supplementation and transfer to the ICU for higher level of care. Pt now transferred back to floor status. CXR 07/23/22: "Mild cardiomegaly with mild, diffuse bilateral interstitial pulmonary opacity, suggesting edema or infection. No focal airspace opacity." Pt is on nectar thick liquids and Dys 1  (puree). Pt resting on 2L nasal canula during evaluation. Pt last seen by SLP service on 05/18/22, with recommendations for regular solids (chopped meat) and thin liquids. Type of Study: Bedside Swallow Evaluation Previous Swallow Assessment: Last swallow assessment on 05/25/22, with recommendations for regular solids and thin liquids. Diet Prior to this Study: Dysphagia 1 (puree);Nectar-thick liquids Temperature Spikes Noted: No (Temp 97.9: WBC 12.2 trending up) Respiratory Status: Nasal cannula History of Recent Intubation: No Behavior/Cognition: Cooperative;Confused;Pleasant mood Oral Cavity Assessment: Within Functional Limits Oral Care Completed by SLP: Recent completion by staff Oral Cavity - Dentition: Dentures, top;Dentures, bottom Vision: Functional for self-feeding Self-Feeding Abilities: Able to feed self;Needs set up Patient Positioning: Upright in bed Baseline Vocal Quality: Normal Volitional Cough: Cognitively unable to elicit Volitional Swallow: Unable to elicit    Oral/Motor/Sensory Function Overall Oral Motor/Sensory Function: Within functional limits   Ice Chips Ice chips: Not tested   Thin Liquid Thin Liquid: Within functional limits    Nectar Thick Nectar Thick Liquid: Not tested   Honey Thick Honey Thick Liquid: Not tested   Puree Puree: Within functional limits   Solid     Solid: Impaired Presentation: Self Fed Oral Phase Impairments: Impaired mastication;Poor awareness of bolus Oral Phase Functional Implications: Oral residue;Prolonged oral transit     [place] Lasundra Hascall Clapp MS CCC-SLP  Martin[place] J Clapp 07/24/2022,7:22 PM

## 2022-07-24 NOTE — Progress Notes (Signed)
Progress Note   Patient: Yolanda Moore [id number] DOB: [birth year] DOA: 07/22/2022     1 DOS: the patient was seen and examined on 07/24/2022     Assessment and Plan: * Acute respiratory failure with hypoxia (HCC) Pulse ox 77% on room air yesterday after likely aspiration episode.  Was on 4 L yesterday now on 2 L.  Taper oxygen as needed.  Switch to Unasyn for aspiration episode.  C. difficile colitis Started on Dificid because I felt it would be easier for her to swallow than the liquid vancomycin.  COVID-19 COVID-19 infection.  With patient gurgling on secretions and unable to take Paxlovid yesterday was switched to remdesivir.  Continue 3-day course.  Restart Solu-Medrol today secondary to rising CRP.  Acute metabolic encephalopathy Mental status better today than yesterday.  Able to talk better.  Enterococcus UTI Patient was on Zosyn yesterday and switched over to Unasyn for today.  Essential hypertension Low-dose Coreg  Chronic diastolic CHF (congestive heart failure) (HCC) Last EF 65 to 70%.  Patient received a dose of Lasix on admission and a dose of Lasix yesterday.  Hold off on further Lasix dosing.  Currently on Coreg  Hyperlipidemia Holding Lipitor  Weakness PT and OT consultation  Dementia without behavioral disturbance (HCC) Hold Aricept  Chronic kidney disease, stage 3b (HCC) Creatinine 1.19 and current GFR 48        Subjective: Patient able to talk without  gurgling on secretions.  Still has a little cough.  Had some diarrhea yesterday and C. difficile turned out being positive.  Physical Exam: Vitals:   07/24/22 0800 07/24/22 0900 07/24/22 1000 07/24/22 1100  BP: (!) 152/72 122/61 (!) 150/63 126/70  Pulse: 64 (!) 59 61 (!) 57  Resp: 19 (!) 21 15 18   Temp:      TempSrc:      SpO2: 99% 100% 100% 99%  Weight:      Height:       Physical Exam HENT:     Head: Normocephalic.     Mouth/Throat:     Pharynx: No oropharyngeal exudate.  Eyes:      General: Lids are normal.     Conjunctiva/sclera: Conjunctivae normal.  Cardiovascular:     Rate and Rhythm: Normal rate and regular rhythm.     Heart sounds: Normal heart sounds, S1 normal and S2 normal.  Pulmonary:     Breath sounds: Examination of the right-lower field reveals decreased breath sounds. Examination of the left-lower field reveals decreased breath sounds. Decreased breath sounds present. No wheezing, rhonchi or rales.  Abdominal:     Palpations: Abdomen is soft.     Tenderness: There is no abdominal tenderness.  Musculoskeletal:     Right lower leg: No swelling.     Left lower leg: No swelling.  Skin:    General: Skin is warm.     Findings: No rash.  Neurological:     Mental Status: She is alert.     Comments: Able to straight leg raise bilaterally and hold arms up in the air.     Data Reviewed: CRP up to 7.9, white blood cell count 12.2, hemoglobin 14.8 platelet count 313, creatinine 1.19, potassium 3.8  Family Communication: Updated patient's daughter on phone  Disposition: Status is: Inpatient Remains inpatient appropriate because: Treating for Enterococcus UTI, COVID-19 infection and C. difficile colitis.  Stable to be transferred out of ICU  Planned Discharge Destination: To be determined    Time spent: 28 minutes  Author:  Loletha Grayer, MD 07/24/2022 12:07 PM  For on call review [email or web address].

## 2022-07-24 NOTE — Progress Notes (Signed)
Pt found on floor, no c/o pain, no bruising noted, pt states she sat on floor.

## 2022-07-24 NOTE — Assessment & Plan Note (Signed)
Started on Dificid because I felt it would be easier for her to swallow than the liquid vancomycin.

## 2022-07-25 ENCOUNTER — Telehealth (HOSPITAL_COMMUNITY): Payer: Self-pay | Admitting: Pharmacy Technician

## 2022-07-25 ENCOUNTER — Other Ambulatory Visit (HOSPITAL_COMMUNITY): Payer: Self-pay

## 2022-07-25 DIAGNOSIS — A0472 Enterocolitis due to Clostridium difficile, not specified as recurrent: Secondary | ICD-10-CM | POA: Diagnosis not present

## 2022-07-25 DIAGNOSIS — U071 COVID-19: Secondary | ICD-10-CM | POA: Diagnosis not present

## 2022-07-25 DIAGNOSIS — I5032 Chronic diastolic (congestive) heart failure: Secondary | ICD-10-CM

## 2022-07-25 DIAGNOSIS — J9601 Acute respiratory failure with hypoxia: Secondary | ICD-10-CM | POA: Diagnosis not present

## 2022-07-25 DIAGNOSIS — N390 Urinary tract infection, site not specified: Secondary | ICD-10-CM | POA: Diagnosis not present

## 2022-07-25 DIAGNOSIS — E876 Hypokalemia: Secondary | ICD-10-CM

## 2022-07-25 LAB — URINE CULTURE: Culture: >=100000 — AB

## 2022-07-25 LAB — BASIC METABOLIC PANEL
Anion gap: 8 (ref 5–15)
BUN: 38 mg/dL — ABNORMAL HIGH (ref 8–23)
CO2: 25 mmol/L (ref 22–32)
Calcium: 9 mg/dL (ref 8.9–10.3)
Chloride: 112 mmol/L — ABNORMAL HIGH (ref 98–111)
Creatinine, Ser: 1.36 mg/dL — ABNORMAL HIGH (ref 0.44–1.00)
GFR, Estimated: 41 mL/min — ABNORMAL LOW (ref >60)
Glucose, Bld: 147 mg/dL — ABNORMAL HIGH (ref 70–99)
Potassium: 3.2 mmol/L — ABNORMAL LOW (ref 3.5–5.1)
Sodium: 145 mmol/L (ref 135–145)

## 2022-07-25 LAB — CBC
HCT: 42 % (ref 36.0–46.0)
Hemoglobin: 13.5 g/dL (ref 12.0–15.0)
MCH: 27 pg (ref 26.0–34.0)
MCHC: 32.1 g/dL (ref 30.0–36.0)
MCV: 84 fL (ref 80.0–100.0)
Platelets: 369 10*3/uL (ref 150–400)
RBC: 5 MIL/uL (ref 3.87–5.11)
RDW: 14.6 % (ref 11.5–15.5)
WBC: 14.8 10*3/uL — ABNORMAL HIGH (ref 4.0–10.5)
nRBC: 0 % (ref 0.0–0.2)

## 2022-07-25 LAB — C-REACTIVE PROTEIN: CRP: 3.1 mg/dL — ABNORMAL HIGH (ref <1.0)

## 2022-07-25 MED ORDER — IPRATROPIUM-ALBUTEROL 0.5-2.5 (3) MG/3ML IN SOLN: 3.0000 mL | Freq: Four times a day (QID) | RESPIRATORY_TRACT | Status: DC

## 2022-07-25 MED ORDER — ATORVASTATIN CALCIUM 20 MG PO TABS
40.0000 mg | ORAL_TABLET | Freq: Every evening | ORAL | Status: DC
  Administered 2022-07-25 – 2022-08-01 (×8): 40 mg via ORAL
  Filled 2022-07-25 (×8): qty 2

## 2022-07-25 MED ORDER — SODIUM CHLORIDE 0.9 % IV SOLN: INTRAVENOUS | Status: DC | PRN

## 2022-07-25 MED ORDER — IPRATROPIUM-ALBUTEROL 20-100 MCG/ACT IN AERS
1.0000 | INHALATION_SPRAY | Freq: Four times a day (QID) | RESPIRATORY_TRACT | Status: DC
  Filled 2022-07-25: qty 4

## 2022-07-25 MED ORDER — POTASSIUM CHLORIDE CRYS ER 20 MEQ PO TBCR
40.0000 meq | EXTENDED_RELEASE_TABLET | Freq: Once | ORAL | Status: AC
  Administered 2022-07-25: 40 meq via ORAL
  Filled 2022-07-25: qty 2

## 2022-07-25 MED ORDER — IPRATROPIUM-ALBUTEROL 20-100 MCG/ACT IN AERS
2.0000 | INHALATION_SPRAY | Freq: Four times a day (QID) | RESPIRATORY_TRACT | Status: DC
  Administered 2022-07-25 – 2022-08-02 (×32): 2 via RESPIRATORY_TRACT

## 2022-07-25 NOTE — TOC Benefit Eligibility Note (Signed)
Patient Research scientist (life sciences) completed.     The patient is currently admitted and upon discharge could be taking Dificid 200mg .   The current 8 day co-pay is, $100. (Test claim said to try Vancomycin. It requires a PA) Will request PA per pharmacists request.  The patient is insured through [employer].

## 2022-07-25 NOTE — Evaluation (Signed)
Physical Therapy Evaluation Patient Details Name: DELMARIE TRABER MRN: [id number] DOB: 03-07-1946 Today's Date: 07/25/2022  History of Present Illness  Patient is a 76 year old female with Acute respiratory failure with hypoxia, [id number], Acute metabolic encephalopathy, UTI, C. difficile colitis. History of hypertension, hyperlipidemia, dementia, CKD stage IIIb, coronary artery disease  and from independent living facility.   Clinical Impression  Patient is agreeable to PT. She required assistance for bed mobility and transfers. +2 person assistance initially for ambulation progressing to one person assistance. Patient is unsteady with walking and is fatigued with activity. Sp02 100% on room air. Recommend to continue PT to maximize independence and decrease caregiver burden. The patient is not at her baseline level of functional mobility. Consider SNF placement at this time.      Recommendations for follow up therapy are one component of a multi-disciplinary discharge planning process, led by the attending physician.  Recommendations may be updated based on patient status, additional functional criteria and insurance authorization.  Follow Up Recommendations Skilled nursing-short term rehab (<3 hours/day) Can patient physically be transported by private vehicle: Yes    Assistance Recommended at Discharge Frequent or constant Supervision/Assistance  Patient can return home with the following  A lot of help with walking and/or transfers;A lot of help with bathing/dressing/bathroom;Help with stairs or ramp for entrance;Assist for transportation;Assistance with cooking/housework;Direct supervision/assist for medications management    Equipment Recommendations None recommended by PT  Recommendations for Other Services       Functional Status Assessment Patient has had a recent decline in their functional status and demonstrates the ability to make significant improvements in function in a  reasonable and predictable amount of time.     Precautions / Restrictions Precautions Precautions: Fall Restrictions Weight Bearing Restrictions: No      Mobility  Bed Mobility Overal bed mobility: Needs Assistance Bed Mobility: Supine to Sit, Sit to Supine     Supine to sit: Min assist Sit to supine: Mod assist   General bed mobility comments: increased assistance required for return to bed. verbal cues for technique    Transfers Overall transfer level: Needs assistance Equipment used: 1 person hand held assist Transfers: Sit to/from Stand Sit to Stand: Mod assist           General transfer comment: lifting and lowering assistance provided. cues for task initiation    Ambulation/Gait Ambulation/Gait assistance: Mod assist, +2 physical assistance Gait Distance (Feet): 30 Feet (x 2) Assistive device: 1 person hand held assist, 2 person hand held assist Gait Pattern/deviations: Step-through pattern, Narrow base of support Gait velocity: decreased     General Gait Details: patient initially required +2 person assistance for ambulation. she progressed to one person assistance. she has limited activity tolerance for progression of further activity. unsteadiness throughout  Stairs            Wheelchair Mobility    Modified Rankin (Stroke Patients Only)       Balance Overall balance assessment: Needs assistance Sitting-balance support: Feet supported Sitting balance-Leahy Scale: Fair     Standing balance support: Bilateral upper extremity supported Standing balance-Leahy Scale: Poor Standing balance comment: unsteadiness with standing, external support required                             Pertinent Vitals/Pain Pain Assessment Pain Assessment: No/denies pain    Home Living Family/patient expects to be discharged to:: Private residence Living Arrangements: Alone Available Help at  Discharge: Family;Available PRN/intermittently Type of  Home: Independent living facility Home Access: Level entry       Home Layout: One level Home Equipment: Conservation officer, nature (2 wheels);Shower seat      Prior Function Prior Level of Function : Independent/Modified Independent             Mobility Comments: amb with RW ADLs Comments: Daugther reports she and her sister assist with all ADLs and IADLs, pt resides at [facility].     Hand Dominance   Dominant Hand: Right    Extremity/Trunk Assessment   Upper Extremity Assessment Upper Extremity Assessment: Generalized weakness    Lower Extremity Assessment Lower Extremity Assessment: Generalized weakness       Communication   Communication: No difficulties  Cognition Arousal/Alertness: Awake/alert Behavior During Therapy: WFL for tasks assessed/performed Overall Cognitive Status: History of cognitive impairments - at baseline                                 General Comments: cues required for sequencing, task initiation. oriented to person, place (selected correctly when provided with 3 choices). disoriented to time and situation. states her daughter's name is Secretary/administrator Comments General comments (skin integrity, edema, etc.): SpO2 100% on RA with activity    Exercises     Assessment/Plan    PT Assessment Patient needs continued PT services  PT Problem List Decreased strength;Decreased range of motion;Decreased activity tolerance;Decreased balance;Decreased mobility;Decreased coordination;Decreased cognition;Decreased knowledge of use of DME       PT Treatment Interventions DME instruction;Gait training;Functional mobility training;Stair training;Therapeutic activities;Therapeutic exercise;Balance training;Neuromuscular re-education;Cognitive remediation;Patient/family education    PT Goals (Current goals can be found in the Care Plan section)  Acute Rehab PT Goals Patient Stated Goal: patient unable to participate with goal  setting PT Goal Formulation: With patient Time For Goal Achievement: 08/08/22 Potential to Achieve Goals: Fair    Frequency Min 2X/week     Co-evaluation PT/OT/SLP Co-Evaluation/Treatment: Yes Reason for Co-Treatment: Complexity of the patient's impairments (multi-system involvement);To address functional/ADL transfers PT goals addressed during session: Mobility/safety with mobility OT goals addressed during session: ADL's and self-care       AM-PAC PT "6 Clicks" Mobility  Outcome Measure Help needed turning from your back to your side while in a flat bed without using bedrails?: A Little Help needed moving from lying on your back to sitting on the side of a flat bed without using bedrails?: A Little Help needed moving to and from a bed to a chair (including a wheelchair)?: A Lot Help needed standing up from a chair using your arms (e.g., wheelchair or bedside chair)?: A Lot Help needed to walk in hospital room?: A Lot Help needed climbing 3-5 steps with a railing? : Total 6 Click Score: 13    End of Session   Activity Tolerance: Patient tolerated treatment well Patient left: in bed;with call bell/phone within reach (nursing student present in room) Nurse Communication: Mobility status PT Visit Diagnosis: Unsteadiness on feet (R26.81);Muscle weakness (generalized) (M62.81)    Time: 4097-3532 PT Time Calculation (min) (ACUTE ONLY): 24 min   Charges:   PT Evaluation $PT Eval Low Complexity: 1 Low         Minna Merritts, PT, MPT   Percell Locus 07/25/2022, 1:03 PM

## 2022-07-25 NOTE — Telephone Encounter (Signed)
Patient Advocate Encounter   Received notification that prior authorization for Vancomycin 125 mg Caps is required/requested.   PA submitted on 07/25/22 to [facility] via [place] Status is Approved.  PA# PA Case: [id number] Effective dates: 11/07/21 through 11/06/22  Patient's copay is $10.

## 2022-07-25 NOTE — Progress Notes (Signed)
SLP Cancellation Note  Patient Details Name: Yolanda Moore MRN: [id number] DOB: 1946-02-27   Cancelled treatment:       Reason Eval/Treat Not Completed: Other (comment) SLP checked in with nursing to ensure safety with advanced solids/liquids. RN reporting pt did well with medication in puree and with thin liquids. SLP will continue to monitor.   [place] Shia Delaine Clapp MS CCC-SLP  Martin[place] J Clapp 07/25/2022, 12:30 PM

## 2022-07-25 NOTE — Plan of Care (Signed)
  Problem: Clinical Measurements: Goal: Ability to maintain clinical measurements within normal limits will improve Outcome: Progressing   Problem: Activity: Goal: Risk for activity intolerance will decrease Outcome: Progressing   Problem: Nutrition: Goal: Adequate nutrition will be maintained Outcome: Progressing   Problem: Coping: Goal: Level of anxiety will decrease Outcome: Progressing   Problem: Pain Managment: Goal: General experience of comfort will improve Outcome: Progressing   Problem: Safety: Goal: Ability to remain free from injury will improve Outcome: Progressing   

## 2022-07-25 NOTE — Progress Notes (Signed)
   07/24/22 2031  What Happened  Was fall witnessed? No  Was patient injured? No  Patient found on floor  Found by Staff-comment  Stated prior activity other (comment)  Follow Up  MD notified Dr Damita Dunnings MD  Time MD notified 2050  Family notified Yes - comment  Time family notified 2056  Additional tests No  Adult Fall Risk Assessment  Risk Factor Category (scoring not indicated) High fall risk per protocol (document High fall risk)  Patient Fall Risk Level High fall risk  Adult Fall Risk Interventions  Required Bundle Interventions *See Row Information* High fall risk - low, moderate, and high requirements implemented  Screening for Fall Injury Risk (To be completed on HIGH fall risk patients) - Assessing Need for Floor Mats  Risk For Fall Injury- Criteria for Floor Mats Confusion/dementia (+NuDESC, CIWA, TBI, etc.)  Vitals  Temp 97.7 F (36.5 C)  Temp Source Oral  BP 134/86  MAP (mmHg) 97  BP Location Left Arm  BP Method Automatic  Patient Position (if appropriate) Sitting  Pulse Rate 94  Pulse Rate Source Monitor  Resp 16  Oxygen Therapy  SpO2 100 %  O2 Device Nasal Cannula  O2 Flow Rate (L/min) 2 L/min

## 2022-07-25 NOTE — Evaluation (Signed)
Occupational Therapy Evaluation Patient Details Name: Yolanda Moore MRN: [id number] DOB: 1946/02/04 Today's Date: 07/25/2022   History of Present Illness Yolanda Moore is a 76 y.o. female with medical history significant of hypertension, hyperlipidemia, dementia, CKD stage IIIb, coronary artery disease who was brought from independent living facility with complaint of confusion.  Her daughter went to see her this morning and she was found to be very weak, confused more than her baseline.  She was not responding that much.  She was then brought to the Emergency Department She has history of dementia.  As per the daughter, she is intermittently confused but not confused at this level.  She lives at independent facility by herself.  She walks without any problem.   Clinical Impression   Pt agreeable to OT/PT co-evaluation to maximize safety and participation. Patient presenting with decreased cognition, endurance, strength, and balance impacting safety and independence in ADLs. Patient is a poor historian and unable to provide any information regarding PLOF. Per chart review, and last OT evaluation patient receives assistance from daughters for ADLs/IADLs and resides in an ILF. Patient currently functioning at Max A for LB dressing, Min guard for toilet transfer, Max A for posterior hygiene, and +2 assist with all mobility for safety. Patient required step by step verbal cues for sequencing tasks this date. She was oriented to self and grossly to place. Patient will benefit from acute OT to increase overall independence in the areas of ADLs and functional mobility in order to safely discharge to next venue of care. Upon hospital discharge, recommend STR to maximize pt safety and return to PLOF.     Recommendations for follow up therapy are one component of a multi-disciplinary discharge planning process, led by the attending physician.  Recommendations may be updated based on patient status, additional  functional criteria and insurance authorization.   Follow Up Recommendations  Skilled nursing-short term rehab (<3 hours/day)    Assistance Recommended at Discharge Frequent or constant Supervision/Assistance  Patient can return home with the following A lot of help with walking and/or transfers;A lot of help with bathing/dressing/bathroom;Assist for transportation;Help with stairs or ramp for entrance;Direct supervision/assist for medications management;Assistance with cooking/housework;Direct supervision/assist for financial management    Functional Status Assessment  Patient has had a recent decline in their functional status and demonstrates the ability to make significant improvements in function in a reasonable and predictable amount of time.  Equipment Recommendations  Other (comment) (defer to next venue of care)    Recommendations for Other Services       Precautions / Restrictions Precautions Precautions: Fall Restrictions Weight Bearing Restrictions: No      Mobility Bed Mobility Overal bed mobility: Needs Assistance Bed Mobility: Supine to Sit     Supine to sit: HOB elevated, Min guard, Supervision     General bed mobility comments: verbal cues to scoot hips forward at EOB, Min guard once sitting EOB    Transfers Overall transfer level: Needs assistance Equipment used: 1 person hand held assist Transfers: Sit to/from Stand Sit to Stand: Mod assist           General transfer comment: STS from EOB with Mod A +1 via handheld assist      Balance Overall balance assessment: Needs assistance Sitting-balance support: Feet supported, Bilateral upper extremity supported Sitting balance-Leahy Scale: Fair     Standing balance support: Bilateral upper extremity supported Standing balance-Leahy Scale: Poor Standing balance comment: Pt with unsteadiness during functional mobility to/from bathroom, no  LOB, +2 for safety                           ADL  either performed or assessed with clinical judgement   ADL Overall ADL's : Needs assistance/impaired Eating/Feeding: Set up Eating/Feeding Details (indicate cue type and reason): Dys 3 (mech soft); thin liquids                 Lower Body Dressing: Maximal assistance;Sitting/lateral leans Lower Body Dressing Details (indicate cue type and reason): Max A to don socks sitting EOB Toilet Transfer: Min guard;+2 for safety/equipment;Regular Toilet;Grab bars;Cueing for sequencing;Cueing for safety Toilet Transfer Details (indicate cue type and reason): Verbal cues for safety/sequencing with use of grab bar, however, pt impulsively sat down on toilet. Toileting- Water quality scientist and Hygiene: Cueing for safety;Sit to/from stand;+2 for safety/equipment Toileting - Clothing Manipulation Details (indicate cue type and reason): Able to push underwear down off of B hips with Min guard in standing, Min A to doff underwear over feet in sitting, supervision for pericare in sitting, Max A for posteror hygiene in standing 2/2 unsteadiness     Functional mobility during ADLs: Min guard;+2 for safety/equipment;Cueing for sequencing;Cueing for safety (to the bathroom via handheld assist)       Vision Baseline Vision/History: 1 Wears glasses Patient Visual Report: No change from baseline       Perception     Praxis      Pertinent Vitals/Pain Pain Assessment Pain Assessment: No/denies pain     Hand Dominance Right   Extremity/Trunk Assessment Upper Extremity Assessment Upper Extremity Assessment: Generalized weakness   Lower Extremity Assessment Lower Extremity Assessment: Generalized weakness       Communication Communication Communication: No difficulties   Cognition Arousal/Alertness: Awake/alert Behavior During Therapy: WFL for tasks assessed/performed Overall Cognitive Status: History of cognitive impairments - at baseline                                  General Comments: Pt with dementia at baseline. Oriented to self, grossly to place [facility] disoriented to time/situation. Step by step verbal cues for sequencing tasks. Able to answer questions, accurately stated one of daughter's name [name] Capers") and that she has 3 daughters. Increased time for processing, repetition for one step commands.     General Comments  SpO2 100% on RA with activity    Exercises Other Exercises Other Exercises: OT provided education re: role of OT, OT POC, post acute recs, sitting up for all meals, EOB/OOB mobility with assistance, home/fall safety.     Shoulder Instructions      Home Living Family/patient expects to be discharged to:: Private residence Living Arrangements: Alone Available Help at Discharge: Family;Available PRN/intermittently Type of Home: Independent living facility [facility]) Home Access: Level entry     Home Layout: One level     Bathroom Shower/Tub: Occupational psychologist: Standard     Home Equipment: Conservation officer, nature (2 wheels);Shower seat          Prior Functioning/Environment Prior Level of Function : Independent/Modified Independent             Mobility Comments: amb with RW ADLs Comments: Daugther reports she and her sister assist with all ADLs and IADLs, pt resides at [facility].        OT Problem List: Decreased strength;Decreased activity tolerance;Decreased cognition;Decreased safety awareness;Impaired balance (sitting and/or  standing);Decreased knowledge of use of DME or AE;Decreased knowledge of precautions      OT Treatment/Interventions: Self-care/ADL training;Therapeutic exercise;Patient/family education;Balance training;Energy conservation;Therapeutic activities;DME and/or AE instruction;Cognitive remediation/compensation    OT Goals(Current goals can be found in the care plan section) Acute Rehab OT Goals Patient Stated Goal: unable to state OT Goal Formulation: Patient  unable to participate in goal setting Time For Goal Achievement: 08/08/22 Potential to Achieve Goals: Good ADL Goals Pt Will Perform Grooming: with supervision;standing Pt Will Perform Upper Body Dressing: with supervision;sitting Pt Will Perform Lower Body Dressing: with supervision;sit to/from stand;sitting/lateral leans Pt Will Transfer to Toilet: with supervision;ambulating;regular height toilet;grab bars Pt Will Perform Toileting - Clothing Manipulation and hygiene: with supervision;sit to/from stand  OT Frequency: Min 2X/week    Co-evaluation PT/OT/SLP Co-Evaluation/Treatment: Yes Reason for Co-Treatment: For patient/therapist safety;To address functional/ADL transfers PT goals addressed during session: Mobility/safety with mobility;Balance OT goals addressed during session: ADL's and self-care      AM-PAC OT "6 Clicks" Daily Activity     Outcome Measure Help from another person eating meals?: A Little Help from another person taking care of personal grooming?: A Little Help from another person toileting, which includes using toliet, bedpan, or urinal?: A Lot Help from another person bathing (including washing, rinsing, drying)?: A Lot Help from another person to put on and taking off regular upper body clothing?: A Little Help from another person to put on and taking off regular lower body clothing?: A Lot 6 Click Score: 15   End of Session Nurse Communication: Mobility status  Activity Tolerance: Patient tolerated treatment well;Patient limited by fatigue Patient left: in bed;with call bell/phone within reach;with bed alarm set;with nursing/sitter in room  OT Visit Diagnosis: Unsteadiness on feet (R26.81);History of falling (Z91.81);Muscle weakness (generalized) (M62.81);Other symptoms and signs involving cognitive function                Time: [id number] OT Time Calculation (min): 21 min Charges:  OT General Charges $OT Visit: 1 Visit OT Evaluation $OT Eval Low  Complexity: 1 Low  [facility] MS, OTR/L ascom [phone]  07/25/22, 9:53 AM

## 2022-07-25 NOTE — Progress Notes (Addendum)
Progress Note   Patient: Yolanda Moore [id number] DOB: Jan 12, 1946 DOA: 07/22/2022     2 DOS: the patient was seen and examined on 07/25/2022   Brief hospital course: 76 year old female with chronic kidney disease, dementia, hypertension, CAD.  A few months ago she was admitted with sepsis secondary to E. coli.  She was brought in with confusion.  She was found to have COVID-19 infection and a urinary tract infection.  She was started on antibiotics.  She did have an aspiration event on 07/23/2022 and was transferred to the ICU because she was gurgling on secretions.  Patient was kept n.p.o. that day.  She was placed on 4 L of oxygen.  She did better the next day and was tolerating diet and transferred back to the floor.  She was also diagnosed with C. difficile colitis, present on admission and started on Dificid.  Enterococcus growing out of the urine culture.  Patient on Unasyn.  Patient completed 3 days of remdesivir for COVID-19 infection.  Currently off oxygen.  Assessment and Plan: * Acute respiratory failure with hypoxia (HCC) Pulse ox 77% on room air yesterday after likely aspiration episode.  Was on 4 L of oxygen with the aspiration episode.  Currently off oxygen at this point.  Complete 5 days of Unasyn for aspiration episode.  C. difficile colitis Present on admission.  Continue Dificid at least 7 days after antibiotics stopped.  COVID-19 COVID-19 infection.  Completed 3 days of remdesivir.  Continue Solu-Medrol today and hopefully switch over to prednisone for tomorrow.  Acute metabolic encephalopathy Mental status much improved.  Enterococcus UTI Continue Unasyn.  Essential hypertension Low-dose Coreg  Hypokalemia Replace potassium today  Chronic diastolic CHF (congestive heart failure) (HCC) Last EF 65 to 70%.  Patient received a dose of Lasix on admission and a dose of Lasix 2 days ago.  Hold off on further Lasix dosing.  Currently on Coreg.  This is likely more  secondary to respiratory issues rather than heart issues  Hyperlipidemia Restart Lipitor  Weakness PT and OT consultations  Dementia without behavioral disturbance (HCC) Hold Aricept  Chronic kidney disease, stage 3b (HCC) Creatinine 1.36 with a GFR 41        Subjective: Patient feeling okay.  Ate a good breakfast.  No shortness of breath.  No cough.  Admitted with COVID infection.  Also had an aspiration event during the hospital course and diagnosed with C. difficile colitis.  Physical Exam: Vitals:   07/24/22 2031 07/24/22 2117 07/25/22 0245 07/25/22 0800  BP: 134/86  (!) 144/68 (!) 141/64  Pulse: 94  65 62  Resp: 16  16 18   Temp: 97.7 F (36.5 C)  97.9 F (36.6 C) 97.8 F (36.6 C)  TempSrc: Oral  Oral Oral  SpO2: 100% 100% 100% 99%  Weight:      Height:       Physical Exam HENT:     Head: Normocephalic.     Mouth/Throat:     Pharynx: No oropharyngeal exudate.  Eyes:     General: Lids are normal.     Conjunctiva/sclera: Conjunctivae normal.  Cardiovascular:     Rate and Rhythm: Normal rate and regular rhythm.     Heart sounds: Normal heart sounds, S1 normal and S2 normal.  Pulmonary:     Breath sounds: Examination of the right-lower field reveals decreased breath sounds. Examination of the left-lower field reveals decreased breath sounds. Decreased breath sounds present. No wheezing, rhonchi or rales.  Abdominal:  Palpations: Abdomen is soft.     Tenderness: There is no abdominal tenderness.  Musculoskeletal:     Right lower leg: No swelling.     Left lower leg: No swelling.  Skin:    General: Skin is warm.     Findings: No rash.  Neurological:     Mental Status: She is alert.     Data Reviewed: Potassium 3.2, creatinine 1.36, white blood cell count 14.8, hemoglobin 13.5, platelet count 369  Family Communication: Updated patient's daughter on the phone  Disposition: Status is: Inpatient Remains inpatient appropriate because: Treating for C.  difficile colitis, Enterococcus urinary tract infection, COVID-19 infection and aspiration episode.  Physical therapy recommending rehab so likely we will have to go through isolation here.  Planned Discharge Destination: Rehab    Time spent: 28 minutes  Author: Loletha Grayer, MD 07/25/2022 1:19 PM  For on call review [email or web address].

## 2022-07-25 NOTE — Telephone Encounter (Signed)
Pharmacy Patient Advocate Encounter  Insurance verification completed.    The patient is insured through [employer]   The patient is currently admitted and ran test claims for the following: Dificid 200mg  #16 tabs for a 8 day supply. Test claim recommends Vancomycin 250mg  caps #16 for 8 day supply (requires PA)  Copays and coinsurance results were relayed to Inpatient clinical team.

## 2022-07-25 NOTE — Assessment & Plan Note (Signed)
Replace potassium today. 

## 2022-07-25 NOTE — Hospital Course (Signed)
76 year old female with chronic kidney disease, dementia, hypertension, CAD.  A few months ago she was admitted with sepsis secondary to E. coli.  She was brought in with confusion.  She was found to have COVID-19 infection and a urinary tract infection.  She was started on antibiotics.  She did have an aspiration event on 07/23/2022 and was transferred to the ICU because she was gurgling on secretions.  Patient was kept n.p.o. that day.  She was placed on 4 L of oxygen.  She did better the next day and was tolerating diet and transferred back to the floor.  She was also diagnosed with C. difficile colitis, present on admission and started on Dificid.  Enterococcus growing out of the urine culture.  Patient switched from Unasyn to Augmentin.  Patient completed 3 days of remdesivir for COVID-19 infection.  Currently off oxygen.  Physical therapy recommending rehab.  Will remain in isolation until able to go out to rehab.

## 2022-07-26 DIAGNOSIS — A0472 Enterocolitis due to Clostridium difficile, not specified as recurrent: Secondary | ICD-10-CM | POA: Diagnosis not present

## 2022-07-26 DIAGNOSIS — E870 Hyperosmolality and hypernatremia: Secondary | ICD-10-CM

## 2022-07-26 DIAGNOSIS — U071 COVID-19: Secondary | ICD-10-CM | POA: Diagnosis not present

## 2022-07-26 DIAGNOSIS — J9601 Acute respiratory failure with hypoxia: Secondary | ICD-10-CM | POA: Diagnosis not present

## 2022-07-26 DIAGNOSIS — G9341 Metabolic encephalopathy: Secondary | ICD-10-CM | POA: Diagnosis not present

## 2022-07-26 LAB — CBC
HCT: 39.6 % (ref 36.0–46.0)
Hemoglobin: 12.5 g/dL (ref 12.0–15.0)
MCH: 26.9 pg (ref 26.0–34.0)
MCHC: 31.6 g/dL (ref 30.0–36.0)
MCV: 85.2 fL (ref 80.0–100.0)
Platelets: 395 10*3/uL (ref 150–400)
RBC: 4.65 MIL/uL (ref 3.87–5.11)
RDW: 14.7 % (ref 11.5–15.5)
WBC: 13.9 10*3/uL — ABNORMAL HIGH (ref 4.0–10.5)
nRBC: 0 % (ref 0.0–0.2)

## 2022-07-26 LAB — COMPREHENSIVE METABOLIC PANEL
ALT: 20 U/L (ref 0–44)
AST: 17 U/L (ref 15–41)
Albumin: 3 g/dL — ABNORMAL LOW (ref 3.5–5.0)
Alkaline Phosphatase: 46 U/L (ref 38–126)
Anion gap: 6 (ref 5–15)
BUN: 34 mg/dL — ABNORMAL HIGH (ref 8–23)
CO2: 26 mmol/L (ref 22–32)
Calcium: 8.8 mg/dL — ABNORMAL LOW (ref 8.9–10.3)
Chloride: 117 mmol/L — ABNORMAL HIGH (ref 98–111)
Creatinine, Ser: 1.27 mg/dL — ABNORMAL HIGH (ref 0.44–1.00)
GFR, Estimated: 44 mL/min — ABNORMAL LOW (ref >60)
Glucose, Bld: 171 mg/dL — ABNORMAL HIGH (ref 70–99)
Potassium: 3.3 mmol/L — ABNORMAL LOW (ref 3.5–5.1)
Sodium: 149 mmol/L — ABNORMAL HIGH (ref 135–145)
Total Bilirubin: 0.3 mg/dL (ref 0.3–1.2)
Total Protein: 6.5 g/dL (ref 6.5–8.1)

## 2022-07-26 LAB — C-REACTIVE PROTEIN: CRP: 1 mg/dL — ABNORMAL HIGH (ref <1.0)

## 2022-07-26 MED ORDER — POTASSIUM CHLORIDE CRYS ER 20 MEQ PO TBCR
40.0000 meq | EXTENDED_RELEASE_TABLET | Freq: Once | ORAL | Status: AC
  Administered 2022-07-26: 40 meq via ORAL
  Filled 2022-07-26: qty 2

## 2022-07-26 MED ORDER — AMOXICILLIN-POT CLAVULANATE 875-125 MG PO TABS
1.0000 | ORAL_TABLET | Freq: Two times a day (BID) | ORAL | Status: AC
  Administered 2022-07-26 – 2022-07-27 (×3): 1 via ORAL
  Filled 2022-07-26 (×3): qty 1

## 2022-07-26 MED ORDER — PREDNISONE 20 MG PO TABS
40.0000 mg | ORAL_TABLET | Freq: Every day | ORAL | Status: DC
  Administered 2022-07-27 – 2022-07-29 (×3): 40 mg via ORAL
  Filled 2022-07-26 (×3): qty 2

## 2022-07-26 MED ORDER — DEXTROSE 5 % IV BOLUS
250.0000 mL | Freq: Once | INTRAVENOUS | Status: AC
  Administered 2022-07-26: 250 mL via INTRAVENOUS

## 2022-07-26 NOTE — Plan of Care (Signed)

## 2022-07-26 NOTE — Assessment & Plan Note (Signed)
Sodium went up to 149.  We will give a bolus with D5W and recheck BMP tomorrow.

## 2022-07-26 NOTE — Progress Notes (Signed)
Progress Note   Patient: Yolanda Moore [id number] DOB: [birth year] DOA: 07/22/2022     3 DOS: the patient was seen and examined on 07/26/2022   Brief hospital course: 76 year old female with chronic kidney disease, dementia, hypertension, CAD.  A few months ago she was admitted with sepsis secondary to E. coli.  She was brought in with confusion.  She was found to have COVID-19 infection and a urinary tract infection.  She was started on antibiotics.  She did have an aspiration event on 07/23/2022 and was transferred to the ICU because she was gurgling on secretions.  Patient was kept n.p.o. that day.  She was placed on 4 L of oxygen.  She did better the next day and was tolerating diet and transferred back to the floor.  She was also diagnosed with C. difficile colitis, present on admission and started on Dificid.  Enterococcus growing out of the urine culture.  Patient switched from Unasyn to Augmentin.  Patient completed 3 days of remdesivir for COVID-19 infection.  Currently off oxygen.  Physical therapy recommending rehab.  Will remain in isolation until able to go out to rehab.  Assessment and Plan: * Acute respiratory failure with hypoxia (HCC) Pulse ox 77% on room air yesterday after likely aspiration episode.  Was on 4 L of oxygen with the aspiration episode.  Currently off oxygen at this point.  Switch to Augmentin for completion of course for aspiration episode and UTI.  C. difficile colitis Present on admission.  Continue Dificid at least 7 days after antibiotics stopped.  COVID-19 COVID-19 infection.  Completed 3 days of remdesivir.  Switch Solu-Medrol over to prednisone with CRP being down and now off oxygen  Acute metabolic encephalopathy Mental status much improved.  Enterococcus UTI Unasyn switched over to Augmentin.  Essential hypertension Low-dose Coreg  Hypernatremia Sodium went up to 149.  We will give a bolus with D5W and recheck BMP tomorrow.  Hypokalemia Replace  potassium today  Chronic diastolic CHF (congestive heart failure) (HCC) Last EF 65 to 70%.  Patient received a dose of Lasix on admission and a dose of Lasix 2 days ago.  Hold off on further Lasix dosing.  Currently on Coreg.  This is likely more secondary to respiratory issues rather than heart issues  Hyperlipidemia Restart Lipitor  Weakness Rehab recommended by PT and OT  Dementia without behavioral disturbance (HCC) Hold Aricept  Chronic kidney disease, stage 3b (HCC) Creatinine 1.27 with a GFR 44        Subjective: Patient feels okay.  Offers no complaints.  I know her from previous hospitalization she never complains about anything.  Treating for C. difficile infection COVID infection and aspiration episode with UTI.  Physical Exam: Vitals:   07/26/22 0353 07/26/22 0919 07/26/22 1753 07/26/22 1756  BP: (!) 154/71 (!) 155/70 (!) 165/73   Pulse: 62 (!) 57 (!) 57 61  Resp: 18     Temp: 98.3 F (36.8 C) 98.4 F (36.9 C)    TempSrc: Oral     SpO2: 96% 98% 100%   Weight:      Height:       Physical Exam HENT:     Head: Normocephalic.     Mouth/Throat:     Pharynx: No oropharyngeal exudate.  Eyes:     General: Lids are normal.     Conjunctiva/sclera: Conjunctivae normal.  Cardiovascular:     Rate and Rhythm: Normal rate and regular rhythm.     Heart sounds: Normal heart  sounds, S1 normal and S2 normal.  Pulmonary:     Breath sounds: Examination of the right-lower field reveals decreased breath sounds. Examination of the left-lower field reveals decreased breath sounds. Decreased breath sounds present. No wheezing, rhonchi or rales.  Abdominal:     Palpations: Abdomen is soft.     Tenderness: There is no abdominal tenderness.  Musculoskeletal:     Right lower leg: No swelling.     Left lower leg: No swelling.  Skin:    General: Skin is warm.     Findings: No rash.  Neurological:     Mental Status: She is alert.     Data Reviewed: Sodium 149, potassium  3.3, creatinine 1.27, CRP down to 1.0, white blood cell count 13.9, hemoglobin 12.5, platelet count 395  Family Communication: Updated daughter on the phone  Disposition: Status is: Inpatient Remains inpatient appropriate because: Physical therapy recommending rehab admit [facility] 08[facility]02/2022  Planned Discharge Destination: Rehab    Time spent: 28 minutes  Author: Loletha Grayer, MD 07/26/2022 6:21 PM  For on call review [email or web address].

## 2022-07-26 NOTE — Progress Notes (Signed)
Physical Therapy Treatment Patient Details Name: Yolanda Moore MRN: [id number] DOB: 08/06/1946 Today's Date: 07/26/2022   History of Present Illness Patient is a 76 year old female with Acute respiratory failure with hypoxia, [id number], Acute metabolic encephalopathy, UTI, C. difficile colitis. History of hypertension, hyperlipidemia, dementia, CKD stage IIIb, coronary artery disease  and from independent living facility.    PT Comments    Patient agreeable to PT and was cooperative throughout. Patient continues to require assistance for bed mobility and transfers. She declined ambulation today and was fatigued with activity. Spoke with the daughter in the hallway after the session who is in agreement with the recommendation for SNF at discharge. Recommend to continue PT to maximize independence and facilitate return to prior level of function.    Recommendations for follow up therapy are one component of a multi-disciplinary discharge planning process, led by the attending physician.  Recommendations may be updated based on patient status, additional functional criteria and insurance authorization.  Follow Up Recommendations  Skilled nursing-short term rehab (<3 hours/day) Can patient physically be transported by private vehicle: Yes   Assistance Recommended at Discharge Frequent or constant Supervision/Assistance  Patient can return home with the following A lot of help with walking and/or transfers;A lot of help with bathing/dressing/bathroom;Help with stairs or ramp for entrance;Assist for transportation;Assistance with cooking/housework;Direct supervision/assist for medications management   Equipment Recommendations  None recommended by PT    Recommendations for Other Services       Precautions / Restrictions Precautions Precautions: Fall Restrictions Weight Bearing Restrictions: No     Mobility  Bed Mobility Overal bed mobility: Needs Assistance Bed Mobility: Supine to Sit,  Sit to Supine     Supine to sit: Min assist Sit to supine: Min assist   General bed mobility comments: assistance for LE and trunk support. verbal cues for technique and task initiation    Transfers Overall transfer level: Needs assistance   Transfers: Sit to/from Stand Sit to Stand: Mod assist           General transfer comment: lifting and lowering assistance provided with transfers. verbal cues for task initiation    Ambulation/Gait               General Gait Details: patient declined walking   Stairs             Wheelchair Mobility    Modified Rankin (Stroke Patients Only)       Balance Overall balance assessment: Needs assistance Sitting-balance support: Feet supported Sitting balance-Leahy Scale: Fair Sitting balance - Comments: no loss of balance   Standing balance support: No upper extremity supported Standing balance-Leahy Scale: Poor Standing balance comment: Min A to maintain standing balance with occasional unsteadiness                            Cognition Arousal/Alertness: Awake/alert Behavior During Therapy: WFL for tasks assessed/performed Overall Cognitive Status: History of cognitive impairments - at baseline                                          Exercises      General Comments        Pertinent Vitals/Pain Pain Assessment Pain Assessment: No/denies pain    Home Living  Prior Function            PT Goals (current goals can now be found in the care plan section) Acute Rehab PT Goals Patient Stated Goal: patient unable to participate with goal setting PT Goal Formulation: With patient Time For Goal Achievement: 08/08/22 Potential to Achieve Goals: Fair Progress towards PT goals: Progressing toward goals    Frequency    Min 2X/week      PT Plan Current plan remains appropriate    Co-evaluation              AM-PAC PT "6 Clicks"  Mobility   Outcome Measure  Help needed turning from your back to your side while in a flat bed without using bedrails?: A Little Help needed moving from lying on your back to sitting on the side of a flat bed without using bedrails?: A Little Help needed moving to and from a bed to a chair (including a wheelchair)?: A Lot Help needed standing up from a chair using your arms (e.g., wheelchair or bedside chair)?: A Lot Help needed to walk in hospital room?: A Lot Help needed climbing 3-5 steps with a railing? : Total 6 Click Score: 13    End of Session   Activity Tolerance: Patient tolerated treatment well Patient left: in chair;with call bell/phone within reach;with bed alarm set (set-up lunch tray) Nurse Communication: Mobility status PT Visit Diagnosis: Unsteadiness on feet (R26.81);Muscle weakness (generalized) (M62.81)     Time: 9326-7124 PT Time Calculation (min) (ACUTE ONLY): 28 min  Charges:  $Therapeutic Activity: 23-37 mins                     Minna Merritts, PT, MPT    Percell Locus 07/26/2022, 3:55 PM

## 2022-07-26 NOTE — TOC Progression Note (Addendum)
Transition of Care [facility]) - Progression Note    Patient Details  Name: CARAH BARRIENTES MRN: [id number] Date of Birth: [birth year]  [facility]) CM/SW [place], LCSW Phone Number: 07/26/2022, 10:05 AM  Clinical Narrative:   Left voicemail for daughter, [name] Capers. Will discuss SNF recommendation when she calls back. Patient tested positive for COVID on 9/15.  10:52 am: Received call back from daughter. Discussed SNF recommendation and COVID quarantine requirements prior admission to SNF. No facility preferences other than [facility]. Told her how to access CMS website to review local facility ratings.  Expected Discharge Plan and Services                                                 Social Determinants of Health (SDOH) Interventions    Readmission Risk Interventions     No data to display

## 2022-07-27 DIAGNOSIS — J9601 Acute respiratory failure with hypoxia: Secondary | ICD-10-CM | POA: Diagnosis not present

## 2022-07-27 LAB — BASIC METABOLIC PANEL
Anion gap: 9 (ref 5–15)
BUN: 29 mg/dL — ABNORMAL HIGH (ref 8–23)
CO2: 22 mmol/L (ref 22–32)
Calcium: 9 mg/dL (ref 8.9–10.3)
Chloride: 114 mmol/L — ABNORMAL HIGH (ref 98–111)
Creatinine, Ser: 1.01 mg/dL — ABNORMAL HIGH (ref 0.44–1.00)
GFR, Estimated: 58 mL/min — ABNORMAL LOW (ref >60)
Glucose, Bld: 130 mg/dL — ABNORMAL HIGH (ref 70–99)
Potassium: 4.1 mmol/L (ref 3.5–5.1)
Sodium: 145 mmol/L (ref 135–145)

## 2022-07-27 LAB — PHOSPHORUS: Phosphorus: 1.9 mg/dL — ABNORMAL LOW (ref 2.5–4.6)

## 2022-07-27 LAB — C-REACTIVE PROTEIN: CRP: <0.5 mg/dL (ref <1.0)

## 2022-07-27 LAB — MAGNESIUM: Magnesium: 2.8 mg/dL — ABNORMAL HIGH (ref 1.7–2.4)

## 2022-07-27 NOTE — Evaluation (Signed)
Physical Therapy Evaluation Patient Details Name: Yolanda Moore MRN: [id number] DOB: Jun 06, 1946 Today's Date: 07/27/2022  History of Present Illness  Patient is a 76 year old female with Acute respiratory failure with hypoxia, [id number], Acute metabolic encephalopathy, UTI, C. difficile colitis. History of hypertension, hyperlipidemia, dementia, CKD stage IIIb, coronary artery disease  and from independent living facility.   Clinical Impression  Patient sleeping on arrival to room. She was confused but is agreeable and cooperative with mobility. She continues to require assistance with bed mobility, transfers, and ambulation in the room. Unsteadiness with walking requiring moderate assistance initially progressing to minimal assistance with increased ambulation distance. Activity tolerance limited by fatigue. Recommend to continue PT to maximize independence and facilitate return to prior level of function.  SNF discharge is still appropriate.      Recommendations for follow up therapy are one component of a multi-disciplinary discharge planning process, led by the attending physician.  Recommendations may be updated based on patient status, additional functional criteria and insurance authorization.  Follow Up Recommendations Skilled nursing-short term rehab (<3 hours/day) Can patient physically be transported by private vehicle: Yes    Assistance Recommended at Discharge Frequent or constant Supervision/Assistance  Patient can return home with the following  A lot of help with walking and/or transfers;A lot of help with bathing/dressing/bathroom;Help with stairs or ramp for entrance;Assist for transportation;Assistance with cooking/housework;Direct supervision/assist for medications management    Equipment Recommendations None recommended by PT  Recommendations for Other Services       Functional Status Assessment       Precautions / Restrictions Precautions Precautions:  Fall Restrictions Weight Bearing Restrictions: No      Mobility  Bed Mobility Overal bed mobility: Needs Assistance Bed Mobility: Supine to Sit, Sit to Supine     Supine to sit: Min assist Sit to supine: Min assist   General bed mobility comments: assistance for LE and trunk support. verbal cues for technique and task initiation    Transfers Overall transfer level: Needs assistance Equipment used: 1 person hand held assist Transfers: Sit to/from Stand Sit to Stand: Min assist           General transfer comment: lifting assistance required for standing. verbal cues for technique. 2 standing bouts performed    Ambulation/Gait Ambulation/Gait assistance: Mod assist, Min assist Gait Distance (Feet): 40 Feet Assistive device: 1 person hand held assist Gait Pattern/deviations: Narrow base of support, Decreased stride length Gait velocity: decreased     General Gait Details: patient ambulated in the room with moderate assistance for steadying initially progressing to Min A with increased ambulation distance. unsteady with navigating obstacles and turns. safety cues and navigational cues required  Stairs            Wheelchair Mobility    Modified Rankin (Stroke Patients Only)       Balance Overall balance assessment: Needs assistance Sitting-balance support: Feet supported Sitting balance-Leahy Scale: Fair     Standing balance support: No upper extremity supported Standing balance-Leahy Scale: Poor Standing balance comment: Min A required                             Pertinent Vitals/Pain Pain Assessment Pain Assessment: No/denies pain    Home Living                          Prior Function  Hand Dominance        Extremity/Trunk Assessment                Communication      Cognition Arousal/Alertness: Awake/alert Behavior During Therapy: WFL for tasks assessed/performed Overall  Cognitive Status: History of cognitive impairments - at baseline                                          General Comments      Exercises     Assessment/Plan    PT Assessment    PT Problem List         PT Treatment Interventions      PT Goals (Current goals can be found in the Care Plan section)  Acute Rehab PT Goals Patient Stated Goal: patient unable to participate with goal setting PT Goal Formulation: Patient unable to participate in goal setting Time For Goal Achievement: 08/08/22 Potential to Achieve Goals: Fair    Frequency Min 2X/week     Co-evaluation               AM-PAC PT "6 Clicks" Mobility  Outcome Measure Help needed turning from your back to your side while in a flat bed without using bedrails?: A Little Help needed moving from lying on your back to sitting on the side of a flat bed without using bedrails?: A Little Help needed moving to and from a bed to a chair (including a wheelchair)?: A Lot Help needed standing up from a chair using your arms (e.g., wheelchair or bedside chair)?: A Lot Help needed to walk in hospital room?: A Lot Help needed climbing 3-5 steps with a railing? : Total 6 Click Score: 13    End of Session   Activity Tolerance: Patient tolerated treatment well Patient left: in bed;with call bell/phone within reach;with bed alarm set (set-up with lunch tray)   PT Visit Diagnosis: Unsteadiness on feet (R26.81);Muscle weakness (generalized) (M62.81)    Time: [id number] PT Time Calculation (min) (ACUTE ONLY): 23 min   Charges:     PT Treatments $Gait Training: 8-22 mins $Therapeutic Activity: 8-22 mins       Minna Merritts, PT, MPT   Percell Locus 07/27/2022, 2:05 PM

## 2022-07-27 NOTE — Progress Notes (Signed)
Speech Language Pathology Treatment: Dysphagia  Patient Details Name: Yolanda Moore MRN: [id number] DOB: [birth year] Today's Date: 07/27/2022 Time: 0935-1010 SLP Time Calculation (min) (ACUTE ONLY): 35 min  Assessment / Plan / Recommendation Clinical Impression  Pt seen today for ongoing assessment of swallowing. Pt is currently on a mech soft diet w/ thin liquids. No reports of swallowing issues; NSG has reported pt tolerated meds "not crushed last night".  Pt awake, verbal; presents w/ confusion. Alerted to self and some of her birthday. She required Min-Mod verbal/tactile/visual cues to follow through w/ instructions - redirection.   Afebrile; on RA.   Pt required positioning upright in bed and tray setup d/t decreased awareness of self and environment -- suspect presentation is close to her baseline. She fed self w/ tray setup but required FULL setup and prep of meal tray then verbal/tactile cues to clear mouth BETWEEN bites b/f putting more food in her mouth(again, decreased awareness of self). OF NOTE: this could present as a choking hazard.   She continues appears to present w/ primary oral phase dysphagia but overall oropharyngeal phase dysphagia in setting of declined Cognitive status; Baseline Dementia. Cognitive decline can impact overall awareness/timing of swallow and safety during po tasks which increases risk for aspiration, choking. Pt's risk for aspiration can be reduced when following general aspiration precautions and using a modified diet consistency of broken down foods for ease of oral phase and d/t not always wearing her Dentures.         Pt consumed several trials of purees, Chopped then more Minced solids and thin liquids via cup/straw w/ No immediate, overt clinical s/s of aspiration noted: no decline in vocal quality; no decline in respiratory status during/post trials. Pt had a cough x1 during po trials similar to her Baseline congested cough. No further coughing continued  or increased w/ oral intake. Oral phase was adequate for bolus management and oral clearing of the Puree and thin liquid boluses given. Mastication (mashing/gumming) of the softened, chopped solids was c/b increased mastication effort and Time w/ intermittent oral residue remaining (poor awareness of oral clearing). W/ more Minced and moistened foods, oral phase time was lessened and oral clearing achieved more timely.  W/ guidance and setup, she was able to feed self which improves safety of swallowing. Hand over hand guidance and visual cue were helpful to initiate oral care task.   Education provided on general aspiration precautions and need to clear mouth b/t bites b/f putting more food in the mouth -- unsure of pt's comprehension and follow through d/t Baseline Dementia as verbal cues needed often during meal.    OF NOTE: Pt was unable to swallow Pills Whole in puree w/ NSG present -- the Pills were then CRUSHED which pt swallowed easily and safely in a Puree.  This was recommended to NSG from now on. NSG agreed.   In setting of baseline Dementia and Cognitive decline and her risk for aspiration, recommend a dysphagia level 2(MINCED foods moistened for ease of oral phase/mastication) w/ thin liquids; general aspiration precautions; reduce Distractions during meals and engage pt for self-feeding during meals -- tray setup support needed though. Pills Crushed in Puree for safer swallowing as needed. Supervision at meals. MD/NSG updated.  Suspect pt could be close to/at her baseline. Recommend f/u at next venue of care post Acute illness if upgrading oral diet -- suspect pt must wear her Dentures. Precautions posted in room.         HPI HPI:  Per H&P: "Yolanda Moore is a 76 y.o. female with medical history significant of hypertension, hyperlipidemia, Dementia, CKD stage IIIb, coronary artery disease who was brought from independent living facility with complaint of confusion.  Her daughter went to see  her this morning and she was found to be very weak, confused more than her baseline.  She was not responding that much.  She was then brought to the Emergency Department She has history of dementia.  As per the daughter, she is intermittently confused but not confused at this level.  She lives at independent facility by herself.  She walks without any problem." Pt with aspiration event on 07/23/22 (suspect aspiration of secretions). Following aspiration, she developed hypoxia requiring oxygen supplementation and transfer to the ICU for higher level of care. Pt now transferred back to floor status. CXR 07/23/22: "Mild cardiomegaly with mild, diffuse bilateral interstitial pulmonary opacity, suggesting edema or infection. No focal airspace opacity." Pt is on nectar thick liquids and Dys 1 (puree). Pt resting on 2L nasal canula during evaluation. Pt last seen by SLP service on 05/18/22, with recommendations for regular solids (chopped meat) and thin liquids.      SLP Plan  All goals met      Recommendations for follow up therapy are one component of a multi-disciplinary discharge planning process, led by the attending physician.  Recommendations may be updated based on patient status, additional functional criteria and insurance authorization.    Recommendations  Diet recommendations: Dysphagia 2 (fine chop);Thin liquid Liquids provided via: Cup;Straw Medication Administration: Crushed with puree (for safer swallowing) Supervision: Patient able to self feed;Intermittent supervision to cue for compensatory strategies (setup and positioning) Compensations: Minimize environmental distractions;Slow rate;Small sips/bites;Lingual sweep for clearance of pocketing;Follow solids with liquid Postural Changes and/or Swallow Maneuvers: Out of bed for meals;Seated upright 90 degrees;Upright 30-60 min after meal                General recommendations:  (Dietician f/u) Oral Care Recommendations: Oral care  BID;Staff/trained caregiver to provide oral care;Oral care before and after PO (Denture care if wearing dentures) Follow Up Recommendations: Follow physician's recommendations for discharge plan and follow up therapies (possible f/u at next venue of care if upgrading the diet consistency in the future) Assistance recommended at discharge: Frequent or constant Supervision/Assistance SLP Visit Diagnosis: Dysphagia, oropharyngeal phase (R13.12) (primary oral phase deficits impacted by Cognitive decline, Dementia) Plan: All goals met             Orinda Kenner, MS, [place] Speech Language Pathologist Rehab Services; [place] [phone] (ascom) Shanty Ginty  07/27/2022, 10:14 AM

## 2022-07-27 NOTE — Progress Notes (Signed)
Occupational Therapy Treatment Patient Details Name: NECIE WILCOXSON MRN: [id number] DOB: 1946-08-19 Today's Date: 07/27/2022   History of present illness Patient is a 76 year old female with Acute respiratory failure with hypoxia, [id number], Acute metabolic encephalopathy, UTI, C. difficile colitis. History of hypertension, hyperlipidemia, dementia, CKD stage IIIb, coronary artery disease  and from independent living facility.   OT comments  Upon entering session, pt sitting up in bed completing self-feeding. Agreeable to sit at EOB to complete self-care tasks. Pt required Min A for supine to sit. OT noting incontinent BM episode, required Min A to stand from EOB using RW and Max A for posterior hygiene in standing. Pt required Max A for sit to supine and left as received with all needs in reach. Set up A provided for self-feeding. Verbal/tactile cues required throughout session for task initiation and sequencing. Pt is making progress toward goal completion. D/C recommendation remains appropriate. OT will continue to follow acutely.      Recommendations for follow up therapy are one component of a multi-disciplinary discharge planning process, led by the attending physician.  Recommendations may be updated based on patient status, additional functional criteria and insurance authorization.    Follow Up Recommendations  Skilled nursing-short term rehab (<3 hours/day)    Assistance Recommended at Discharge Frequent or constant Supervision/Assistance  Patient can return home with the following  A lot of help with walking and/or transfers;A lot of help with bathing/dressing/bathroom;Assist for transportation;Help with stairs or ramp for entrance;Direct supervision/assist for medications management;Assistance with cooking/housework;Direct supervision/assist for financial management   Equipment Recommendations  Other (comment) (defer to next venue of care)    Recommendations for Other Services       Precautions / Restrictions Precautions Precautions: Fall Restrictions Weight Bearing Restrictions: No       Mobility Bed Mobility Overal bed mobility: Needs Assistance Bed Mobility: Supine to Sit, Sit to Supine     Supine to sit: Min assist Sit to supine: Max assist   General bed mobility comments: assistance for LE and trunk support. verbal cues for technique and task initiation    Transfers Overall transfer level: Needs assistance Equipment used: Rolling walker (2 wheels) Transfers: Sit to/from Stand Sit to Stand: Min assist           General transfer comment: 2x STS from EOB     Balance Overall balance assessment: Needs assistance Sitting-balance support: Feet supported Sitting balance-Leahy Scale: Fair     Standing balance support: Bilateral upper extremity supported, During functional activity Standing balance-Leahy Scale: Fair Standing balance comment: Min guard with BUE support on RW                           ADL either performed or assessed with clinical judgement   ADL Overall ADL's : Needs assistance/impaired Eating/Feeding: Set up               Upper Body Dressing : Sitting;Moderate assistance Upper Body Dressing Details (indicate cue type and reason): to don/doff gown         Toileting- Clothing Manipulation and Hygiene: Cueing for compensatory techniques;Maximal assistance Toileting - Clothing Manipulation Details (indicate cue type and reason): Pt with incontinent BM episode, Max A for posterior hygiene in standing with BUE support on RW     Functional mobility during ADLs: Min guard;Cueing for sequencing;Cueing for safety;Rolling walker (2 wheels) (to take ~2 lateral steps at EOB)      Extremity/Trunk Assessment Upper  Extremity Assessment Upper Extremity Assessment: Generalized weakness   Lower Extremity Assessment Lower Extremity Assessment: Generalized weakness        Vision Baseline Vision/History: 1 Wears  glasses Patient Visual Report: No change from baseline     Perception     Praxis      Cognition Arousal/Alertness: Awake/alert Behavior During Therapy: WFL for tasks assessed/performed, Flat affect Overall Cognitive Status: History of cognitive impairments - at baseline                                 General Comments: verbal/tactile cues for task initiation and sequencing        Exercises      Shoulder Instructions       General Comments      Pertinent Vitals/ Pain       Pain Assessment Pain Assessment: No/denies pain  Home Living                                          Prior Functioning/Environment              Frequency  Min 2X/week        Progress Toward Goals  OT Goals(current goals can now be found in the care plan section)  Progress towards OT goals: Progressing toward goals  Acute Rehab OT Goals Patient Stated Goal: unable to state OT Goal Formulation: Patient unable to participate in goal setting Time For Goal Achievement: 08/08/22 Potential to Achieve Goals: Good  Plan Discharge plan remains appropriate;Frequency remains appropriate    Co-evaluation                 AM-PAC OT "6 Clicks" Daily Activity     Outcome Measure   Help from another person eating meals?: A Little Help from another person taking care of personal grooming?: A Little Help from another person toileting, which includes using toliet, bedpan, or urinal?: A Lot Help from another person bathing (including washing, rinsing, drying)?: A Lot Help from another person to put on and taking off regular upper body clothing?: A Little Help from another person to put on and taking off regular lower body clothing?: A Lot 6 Click Score: 15    End of Session Equipment Utilized During Treatment: Gait belt;Rolling walker (2 wheels)  OT Visit Diagnosis: Unsteadiness on feet (R26.81);History of falling (Z91.81);Muscle weakness (generalized)  (M62.81);Other symptoms and signs involving cognitive function   Activity Tolerance Patient tolerated treatment well   Patient Left in bed;with call bell/phone within reach;with bed alarm set   Nurse Communication Mobility status        Time: [id number] OT Time Calculation (min): 22 min  Charges: OT General Charges $OT Visit: 1 Visit OT Treatments $Self Care/Home Management : 8-22 mins  [facility] MS, OTR/L ascom [phone]  07/27/22, 6:08 PM

## 2022-07-27 NOTE — NC FL2 (Signed)
[place] LEVEL OF CARE SCREENING TOOL     IDENTIFICATION  Patient Name: Yolanda Moore Birthdate: 03-23-1946 Sex: female Admission Date (Current Location): 07/22/2022  [facility] and [place] Number:  Engineering geologist and Address:  [facility], [address] [place], [place] [address]      Provider Number: [id number]  Attending Physician Name and Address:  Sidney Ace, MD  Relative Name and Phone Number:       Current Level of Care: Hospital Recommended Level of Care: [place] Prior Approval Number:    Date Approved/Denied:   PASRR Number: [id number]  Discharge Plan: SNF    Current Diagnoses: Patient Active Problem List   Diagnosis Date Noted   Hypernatremia 07/26/2022   Hypokalemia    C. difficile colitis 07/24/2022   Enterococcus UTI 07/24/2022   Chronic diastolic CHF (congestive heart failure) ([place]) 07/24/2022   Acute respiratory failure with hypoxia ([place]) 07/23/2022   Altered mental status    Acute decompensated heart failure ([place])    COVID-19 07/22/2022   Acute cystitis with hematuria 60/63/0160   Acute metabolic encephalopathy 10/93/2355   Sepsis due to Escherichia coli (E. coli) ([place]) 05/24/2022   Insomnia 05/23/2022   Hyperlipidemia 05/23/2022   Acute kidney injury superimposed on chronic kidney disease (HCC)    Elevated troponin    Chronic kidney disease, stage 3b ([place]) 09/07/2020   Dementia without behavioral disturbance ([place]) 09/07/2020   NSTEMI (non-ST elevated myocardial infarction) ([place]) 09/07/2020   Weakness 09/07/2020   Essential hypertension 04/23/2018    Orientation RESPIRATION BLADDER Height & Weight     Self  Normal Incontinent, External catheter Weight: 143 lb 11.8 oz (65.2 kg) Height:  5' (152.4 cm)  BEHAVIORAL SYMPTOMS/MOOD NEUROLOGICAL BOWEL NUTRITION STATUS   (None)  (Dementia) Incontinent Diet (DYS 3. With extra sauces and gravies.)  AMBULATORY STATUS COMMUNICATION  OF NEEDS Skin   Limited Assist Verbally Normal                       Personal Care Assistance Level of Assistance  Bathing, Feeding, Dressing Bathing Assistance: Maximum assistance Feeding assistance: Limited assistance Dressing Assistance: Maximum assistance     Functional Limitations Info  Sight, Hearing, Speech Sight Info: Adequate Hearing Info: Adequate Speech Info: Adequate    SPECIAL CARE FACTORS FREQUENCY  PT (By licensed PT), OT (By licensed OT)     PT Frequency: 5 x week OT Frequency: 5 x week            Contractures Contractures Info: Not present    Additional Factors Info  Code Status, Allergies, Isolation Precautions Code Status Info: Full code Allergies Info: NKDA     Isolation Precautions Info: Airborne/Enteric: Tested positive for COVID on 9/15.     Current Medications (07/27/2022):  This is the current hospital active medication list Current Facility-Administered Medications  Medication Dose Route Frequency Provider Last Rate Last Admin   0.9 %  sodium chloride infusion   Intravenous PRN Loletha Grayer, MD   Stopped at 07/25/22 1818   amoxicillin-clavulanate (AUGMENTIN) 875-125 MG per tablet 1 tablet  1 tablet Oral Q12H Loletha Grayer, MD   1 tablet at 07/26/22 1756   aspirin EC tablet 81 mg  81 mg Oral Daily Loletha Grayer, MD   81 mg at 07/26/22 0925   atorvastatin (LIPITOR) tablet 40 mg  40 mg Oral QPM Loletha Grayer, MD   40 mg at 07/26/22 1756   carvedilol (COREG)  tablet 3.125 mg  3.125 mg Oral BID WC Adhikari, Amrit, MD   3.125 mg at 07/26/22 1757   enoxaparin (LOVENOX) injection 40 mg  40 mg Subcutaneous Q24H Shelly Coss, MD   40 mg at 07/26/22 2211   fidaxomicin (DIFICID) tablet 200 mg  200 mg Oral BID Loletha Grayer, MD   200 mg at 07/26/22 2212   Ipratropium-Albuterol (COMBIVENT) respimat 2 puff  2 puff Inhalation QID Ottie Glazier, MD   2 puff at 07/26/22 2213   mupirocin ointment (BACTROBAN) 2 % 1 Application  1  Application Nasal BID Loletha Grayer, MD   1 Application at [id number]   Oral care mouth rinse  15 mL Mouth Rinse 4 times per day Loletha Grayer, MD   15 mL at 07/26/22 2213   Oral care mouth rinse  15 mL Mouth Rinse PRN Wieting, Richard, MD       predniSONE (DELTASONE) tablet 40 mg  40 mg Oral Q breakfast Wieting, Richard, MD       traZODone (DESYREL) tablet 50 mg  50 mg Oral QHS PRN Shelly Coss, MD         Discharge Medications: Please see discharge summary for a list of discharge medications.  Relevant Imaging Results:  Relevant Lab Results:   Additional Information SS#: [id number]  Candie Chroman, LCSW

## 2022-07-27 NOTE — Progress Notes (Signed)
PROGRESS NOTE    Yolanda Moore  [id number] DOB: 07/13/1946 DOA: 07/22/2022 PCP: Dion Body, MD    Brief Narrative:  76 year old female with chronic kidney disease, dementia, hypertension, CAD.  A few months ago she was admitted with sepsis secondary to E. coli.  She was brought in with confusion.  She was found to have COVID-19 infection and a urinary tract infection.  She was started on antibiotics.  She did have an aspiration event on 07/23/2022 and was transferred to the ICU because she was gurgling on secretions.  Patient was kept n.p.o. that day.  She was placed on 4 L of oxygen.  She did better the next day and was tolerating diet and transferred back to the floor.  She was also diagnosed with C. difficile colitis, present on admission and started on Dificid.  Enterococcus growing out of the urine culture.  Patient switched from Unasyn to Augmentin.  Patient completed 3 days of remdesivir for COVID-19 infection.  Currently off oxygen.  Physical therapy recommending rehab.  Will remain in isolation until able to go out to rehab.   Assessment & Plan:   Principal Problem:   Acute respiratory failure with hypoxia (HCC) Active Problems:   COVID-19   C. difficile colitis   Acute metabolic encephalopathy   Enterococcus UTI   Essential hypertension   Chronic kidney disease, stage 3b (HCC)   Dementia without behavioral disturbance (HCC)   Weakness   Hyperlipidemia   Altered mental status   Acute decompensated heart failure (HCC)   Chronic diastolic CHF (congestive heart failure) (HCC)   Hypokalemia   Hypernatremia  * Acute respiratory failure with hypoxia (HCC) Pulse ox 77% on room air yesterday after likely aspiration episode.  Was on 4 L of oxygen with the aspiration episode.  Currently off oxygen at this point.  Continue Augmentin for completion of course for aspiration episode and UTI.   C. difficile colitis Present on admission.  Continue Dificid at least 7 days after  antibiotics stopped.   COVID-19 COVID-19 infection.  Completed 3 days of remdesivir.  Continue prednisone  Acute metabolic encephalopathy Mental status much improved.   Enterococcus UTI Unasyn switched over to Augmentin.   Essential hypertension Low-dose Coreg   Hypernatremia improved Sodium went up to 149.  Received fluid bolus of D5 water.  Sodium back in reference range.   Hypokalemia Monitor and replace as necessary   Chronic diastolic CHF (congestive heart failure) (HCC) Last EF 65 to 70%.  Patient received a dose of Lasix on admission and a dose of Lasix 2 days ago.  Hold off on further Lasix dosing.  Currently on Coreg.  This is likely more secondary to respiratory issues rather than heart issues   Hyperlipidemia Restarted Lipitor   Weakness Rehab recommended by PT and OT   Dementia without behavioral disturbance (HCC) Hold Aricept   Chronic kidney disease, stage 3b (HCC) Creatinine 1.27 with a GFR 44   DVT prophylaxis: SQ Lovenox Code Status: Full Family Communication: Daughter Diana Eves [phone] on 9/20 Disposition Plan: Status is: Inpatient Remains inpatient appropriate because: Unsafe discharge plan.  Medically stable.  Awaiting.  For skilled nursing facility placement.  Discharge 9/26.   Level of care: Med-Surg  Consultants:  None  Procedures:  None  Antimicrobials: Augmentin   Subjective: Seen and examined.  Flattened affect.  Resting comfortably in bed.  No visible distress  Objective: Vitals:   07/26/22 1753 07/26/22 1756 07/26/22 2027 07/27/22 0503  BP: (!) 165/73  Marland Kitchen)  152/74 (!) 158/77  Pulse: (!) 57 61 (!) 59 (!) 58  Resp:   18 18  Temp:   98.7 F (37.1 C) 97.6 F (36.4 C)  TempSrc:      SpO2: 100%  98% 100%  Weight:      Height:        Intake/Output Summary (Last 24 hours) at 07/27/2022 1607 Last data filed at 07/27/2022 0516 Gross per 24 hour  Intake --  Output 1050 ml  Net -1050 ml   Filed Weights   07/22/22  1400 07/23/22 1222  Weight: 69.9 kg 65.2 kg    Examination:  General exam: NAD Respiratory system: Bibasilar crackles.  Poor respiratory effort.  Normal work of breathing.  Room air Cardiovascular system: S1-S2, RRR, no murmurs, no pedal edema Gastrointestinal system: Soft, NT/ND, hyperactive bowel sounds Central nervous system: Alert and oriented x2. No focal neurological deficits. Extremities: Symmetric 5 x 5 power. Skin: No rashes, lesions or ulcers Psychiatry: Judgement and insight appear impaired. Mood & affect flattened.     Data Reviewed: I have personally reviewed following labs and imaging studies  CBC: Recent Labs  Lab 07/22/22 1011 07/23/22 0519 07/24/22 0419 07/25/22 0636 07/26/22 0623  WBC 5.0 11.1* 12.2* 14.8* 13.9*  NEUTROABS 3.4  --   --   --   --   HGB 13.1 14.2 14.8 13.5 12.5  HCT 40.9 45.6 46.8* 42.0 39.6  MCV 85.0 87.0 85.2 84.0 85.2  PLT 317 302 313 369 [id number]   Basic Metabolic Panel: Recent Labs  Lab 07/23/22 0519 07/24/22 0419 07/25/22 0636 07/26/22 0623 07/27/22 1000  NA 138 139 145 149* 145  K 4.0 3.8 3.2* 3.3* 4.1  CL 104 104 112* 117* 114*  CO2 24 24 25 26 22   GLUCOSE 118* 145* 147* 171* 130*  BUN 15 20 38* 34* 29*  CREATININE 1.23* 1.19* 1.36* 1.27* 1.01*  CALCIUM 9.5 9.1 9.0 8.8* 9.0  MG  --  1.9  --   --  2.8*  PHOS  --  3.5  --   --  1.9*   GFR: Estimated Creatinine Clearance: 40.6 mL/min (A) (by C-G formula based on SCr of 1.01 mg/dL (H)). Liver Function Tests: Recent Labs  Lab 07/22/22 1011 07/26/22 0623  AST 32 17  ALT 34 20  ALKPHOS 56 46  BILITOT 0.6 0.3  PROT 7.4 6.5  ALBUMIN 3.5 3.0*   No results for input(s): "LIPASE", "AMYLASE" in the last 168 hours. No results for input(s): "AMMONIA" in the last 168 hours. Coagulation Profile: No results for input(s): "INR", "PROTIME" in the last 168 hours. Cardiac Enzymes: No results for input(s): "CKTOTAL", "CKMB", "CKMBINDEX", "TROPONINI" in the last 168 hours. BNP (last  3 results) No results for input(s): "PROBNP" in the last 8760 hours. HbA1C: No results for input(s): "HGBA1C" in the last 72 hours. CBG: Recent Labs  Lab 07/23/22 1215 07/23/22 1947 07/23/22 2319 07/24/22 0355 07/24/22 0751  GLUCAP 149* 157* 136* 133* 137*   Lipid Profile: No results for input(s): "CHOL", "HDL", "LDLCALC", "TRIG", "CHOLHDL", "LDLDIRECT" in the last 72 hours. Thyroid Function Tests: No results for input(s): "TSH", "T4TOTAL", "FREET4", "T3FREE", "THYROIDAB" in the last 72 hours. Anemia Panel: No results for input(s): "VITAMINB12", "FOLATE", "FERRITIN", "TIBC", "IRON", "RETICCTPCT" in the last 72 hours. Sepsis Labs: Recent Labs  Lab 07/22/22 1010 07/22/22 1236  LATICACIDVEN 1.1 1.2    Recent Results (from the past 240 hour(s))  SARS Coronavirus 2 by RT PCR (hospital order, performed in [facility]) *cepheid single result test* Anterior Nasal Swab     Status: Abnormal   Collection Time: 07/22/22 10:07 AM   Specimen: Anterior Nasal Swab  Result Value Ref Range Status   SARS Coronavirus 2 by RT PCR POSITIVE (A) NEGATIVE Final    Comment: (NOTE) SARS-CoV-2 target nucleic acids are DETECTED  SARS-CoV-2 RNA is generally detectable in upper respiratory specimens  during the acute phase of infection.  Positive results are indicative  of the presence of the identified virus, but do not rule out bacterial infection or co-infection with other pathogens not detected by the test.  Clinical correlation with patient history and  other diagnostic information is necessary to determine patient infection status.  The expected result is negative.  Fact Sheet for Patients:   https://www.patel.info/   Fact Sheet for Healthcare Providers:   https://hall.com/    This test is not yet approved or cleared by the [place] FDA and  has been authorized for detection and/or diagnosis of SARS-CoV-2 by FDA under an  Emergency Use Authorization (EUA).  This EUA will remain in effect (meaning this test can be used) for the duration of  the COVID-19 declaration under Section 564(b)(1)  of the Act, 21 U.S.C. section 360-bbb-3(b)(1), unless the authorization is terminated or revoked sooner.   Performed at [facility], [address] [place], [place] [address]   Urine Culture     Status: Abnormal   Collection Time: 07/22/22 12:36 PM   Specimen: Urine, Clean Catch  Result Value Ref Range Status   Specimen Description   Final    URINE, CLEAN CATCH Performed at [facility], [address], [place], [place] [address]    Special Requests   Final    NONE Performed at [facility], [address] [place], [place] [address]    Culture >=100,000 COLONIES/mL ENTEROCOCCUS FAECALIS (A)  Final   Report Status 07/25/2022 FINAL  Final   Organism ID, Bacteria ENTEROCOCCUS FAECALIS (A)  Final      Susceptibility   Enterococcus faecalis - MIC*    AMPICILLIN <=2 SENSITIVE Sensitive     NITROFURANTOIN <=16 SENSITIVE Sensitive     VANCOMYCIN 1 SENSITIVE Sensitive     * >=100,000 COLONIES/mL ENTEROCOCCUS FAECALIS  MRSA Next Gen by PCR, Nasal     Status: Abnormal   Collection Time: 07/23/22 12:25 PM   Specimen: Nasal Mucosa; Nasal Swab  Result Value Ref Range Status   MRSA by PCR Next Gen DETECTED (A) NOT DETECTED Final    Comment: RESULT CALLED TO, READ BACK BY AND VERIFIED WITH: CATHRINE CLAYTON @1353  07/23/22 MJU (NOTE) The GeneXpert MRSA Assay (FDA approved for NASAL specimens only), is one component of a comprehensive MRSA colonization surveillance program. It is not intended to diagnose MRSA infection nor to guide or monitor treatment for MRSA infections. Test performance is not FDA approved in patients less than 41 years old. Performed at [facility], [place], [place] [address]   C Difficile Quick Screen w PCR reflex     Status: Abnormal    Collection Time: 07/24/22  1:44 AM   Specimen: STOOL  Result Value Ref Range Status   C Diff antigen POSITIVE (A) NEGATIVE Final   C Diff toxin NEGATIVE NEGATIVE Final   C Diff interpretation Results are indeterminate. See PCR results.  Final    Comment: Performed at [facility], [address] [place], [place] [address]  C. Diff by PCR, Reflexed  Status: Abnormal   Collection Time: 07/24/22  1:44 AM  Result Value Ref Range Status   Toxigenic C. Difficile by PCR POSITIVE (A) NEGATIVE Final    Comment: Positive for toxigenic C. difficile with little to no toxin production. Only treat if clinical presentation suggests symptomatic illness. Performed at [facility], [address] [place], [place] [address]          Radiology Studies: No results found.      Scheduled Meds:  amoxicillin-clavulanate  1 tablet Oral Q12H   aspirin EC  81 mg Oral Daily   atorvastatin  40 mg Oral QPM   carvedilol  3.125 mg Oral BID WC   enoxaparin (LOVENOX) injection  40 mg Subcutaneous Q24H   fidaxomicin  200 mg Oral BID   Ipratropium-Albuterol  2 puff Inhalation QID   mupirocin ointment  1 Application Nasal BID   mouth rinse  15 mL Mouth Rinse 4 times per day   predniSONE  40 mg Oral Q breakfast   Continuous Infusions:  sodium chloride Stopped (07/25/22 1818)     LOS: 4 days    Sidney Ace, MD [facility]   If 7PM-7AM, please contact night-coverage  07/27/2022, 4:07 PM

## 2022-07-28 DIAGNOSIS — J9601 Acute respiratory failure with hypoxia: Secondary | ICD-10-CM | POA: Diagnosis not present

## 2022-07-28 MED ORDER — ASPIRIN 81 MG PO CHEW
81.0000 mg | CHEWABLE_TABLET | Freq: Every day | ORAL | Status: DC
  Administered 2022-07-28 – 2022-08-02 (×6): 81 mg via ORAL
  Filled 2022-07-28 (×6): qty 1

## 2022-07-28 MED ORDER — K PHOS MONO-SOD PHOS DI & MONO 155-852-130 MG PO TABS
500.0000 mg | ORAL_TABLET | Freq: Two times a day (BID) | ORAL | Status: AC
  Administered 2022-07-28 (×2): 500 mg via ORAL
  Filled 2022-07-28 (×2): qty 2

## 2022-07-28 NOTE — Progress Notes (Signed)
PROGRESS NOTE    Yolanda Moore  [id number] DOB: [birth year] DOA: 07/22/2022 PCP: Dion Body, MD    Brief Narrative:  76 year old female with chronic kidney disease, dementia, hypertension, CAD.  A few months ago she was admitted with sepsis secondary to E. coli.  She was brought in with confusion.  She was found to have COVID-19 infection and a urinary tract infection.  She was started on antibiotics.  She did have an aspiration event on 07/23/2022 and was transferred to the ICU because she was gurgling on secretions.  Patient was kept n.p.o. that day.  She was placed on 4 L of oxygen.  She did better the next day and was tolerating diet and transferred back to the floor.  She was also diagnosed with C. difficile colitis, present on admission and started on Dificid.  Enterococcus growing out of the urine culture.  Patient switched from Unasyn to Augmentin.  Patient completed 3 days of remdesivir for COVID-19 infection.  Currently off oxygen.  Physical therapy recommending rehab.  Will remain in isolation until able to go out to rehab.   Assessment & Plan:   Principal Problem:   Acute respiratory failure with hypoxia (HCC) Active Problems:   COVID-19   C. difficile colitis   Acute metabolic encephalopathy   Enterococcus UTI   Essential hypertension   Chronic kidney disease, stage 3b (HCC)   Dementia without behavioral disturbance (HCC)   Weakness   Hyperlipidemia   Altered mental status   Acute decompensated heart failure (HCC)   Chronic diastolic CHF (congestive heart failure) (HCC)   Hypokalemia   Hypernatremia  * Acute respiratory failure with hypoxia (HCC) Pulse ox 77% on room air yesterday after likely aspiration episode.  Was on 4 L of oxygen with the aspiration episode.  Currently off oxygen at this point.  Continue Augmentin for completion of course for aspiration episode and UTI.   C. difficile colitis Present on admission.  Continue Dificid at least 7 days after  antibiotics stopped.   COVID-19 COVID-19 infection.  Completed 3 days of remdesivir.  Continue prednisone for now.  Start tapering tomorrow  Acute metabolic encephalopathy Mental status much improved.  Appears to be back to baseline   Enterococcus UTI Unasyn switched over to Augmentin.   Essential hypertension Low-dose Coreg   Hypernatremia improved Sodium went up to 149.  Received fluid bolus of D5 water.  Sodium back in reference range.   Hypokalemia Monitor and replace as necessary   Chronic diastolic CHF (congestive heart failure) (HCC) Last EF 65 to 70%.  Patient received a dose of Lasix on admission and a dose of Lasix 2 days ago.  Hold off on further Lasix dosing.  Currently on Coreg.  This is likely more secondary to respiratory issues rather than heart issues   Hyperlipidemia Restarted Lipitor   Weakness Rehab recommended by PT and OT   Dementia without behavioral disturbance (HCC) Hold Aricept   Chronic kidney disease, stage 3b (HCC) Cr stable   DVT prophylaxis: SQ Lovenox Code Status: Full Family Communication: Daughter Sharren Bridge [phone] on 9/20, 9/21 Disposition Plan: Status is: Inpatient Remains inpatient appropriate because: Unsafe discharge plan.  Medically stable.  Awaiting.  For skilled nursing facility placement.  Discharge 9/26.   Level of care: Med-Surg  Consultants:  None  Procedures:  None  Antimicrobials: Augmentin   Subjective: Seen and examined.  Flattened affect.  Resting comfortably in bed.  No visible distress  Objective: Vitals:   07/27/22 0503 07/27/22  2013 07/28/22 0445 07/28/22 0826  BP: (!) 158/77 131/63 (!) 166/72 (!) 167/72  Pulse: (!) 58 (!) 56 (!) 58 (!) 57  Resp: 18 20 18 18   Temp: 97.6 F (36.4 C) 97.8 F (36.6 C) 98.2 F (36.8 C) 98.3 F (36.8 C)  TempSrc:   Oral Oral  SpO2: 100% 100% 100% 100%  Weight:      Height:        Intake/Output Summary (Last 24 hours) at 07/28/2022 1312 Last data filed  at 07/28/2022 0800 Gross per 24 hour  Intake 480 ml  Output 750 ml  Net -270 ml   Filed Weights   07/22/22 1400 07/23/22 1222  Weight: 69.9 kg 65.2 kg    Examination:  General exam: No acute distress Respiratory system: Bibasilar crackles.  Poor respiratory effort.  Normal work of breathing.  Room air Cardiovascular system: S1-S2, RRR, no murmurs, no pedal edema Gastrointestinal system: Soft, NT/ND, hyperactive bowel sounds Central nervous system: Alert and oriented x2. No focal neurological deficits. Extremities: Symmetric 5 x 5 power. Skin: No rashes, lesions or ulcers Psychiatry: Judgement and insight appear impaired. Mood & affect flattened.     Data Reviewed: I have personally reviewed following labs and imaging studies  CBC: Recent Labs  Lab 07/22/22 1011 07/23/22 0519 07/24/22 0419 07/25/22 0636 07/26/22 0623  WBC 5.0 11.1* 12.2* 14.8* 13.9*  NEUTROABS 3.4  --   --   --   --   HGB 13.1 14.2 14.8 13.5 12.5  HCT 40.9 45.6 46.8* 42.0 39.6  MCV 85.0 87.0 85.2 84.0 85.2  PLT 317 302 313 369 295   Basic Metabolic Panel: Recent Labs  Lab 07/23/22 0519 07/24/22 0419 07/25/22 0636 07/26/22 0623 07/27/22 1000  NA 138 139 145 149* 145  K 4.0 3.8 3.2* 3.3* 4.1  CL 104 104 112* 117* 114*  CO2 24 24 25 26 22   GLUCOSE 118* 145* 147* 171* 130*  BUN 15 20 38* 34* 29*  CREATININE 1.23* 1.19* 1.36* 1.27* 1.01*  CALCIUM 9.5 9.1 9.0 8.8* 9.0  MG  --  1.9  --   --  2.8*  PHOS  --  3.5  --   --  1.9*   GFR: Estimated Creatinine Clearance: 40.6 mL/min (A) (by C-G formula based on SCr of 1.01 mg/dL (H)). Liver Function Tests: Recent Labs  Lab 07/22/22 1011 07/26/22 0623  AST 32 17  ALT 34 20  ALKPHOS 56 46  BILITOT 0.6 0.3  PROT 7.4 6.5  ALBUMIN 3.5 3.0*   No results for input(s): "LIPASE", "AMYLASE" in the last 168 hours. No results for input(s): "AMMONIA" in the last 168 hours. Coagulation Profile: No results for input(s): "INR", "PROTIME" in the last 168  hours. Cardiac Enzymes: No results for input(s): "CKTOTAL", "CKMB", "CKMBINDEX", "TROPONINI" in the last 168 hours. BNP (last 3 results) No results for input(s): "PROBNP" in the last 8760 hours. HbA1C: No results for input(s): "HGBA1C" in the last 72 hours. CBG: Recent Labs  Lab 07/23/22 1215 07/23/22 1947 07/23/22 2319 07/24/22 0355 07/24/22 0751  GLUCAP 149* 157* 136* 133* 137*   Lipid Profile: No results for input(s): "CHOL", "HDL", "LDLCALC", "TRIG", "CHOLHDL", "LDLDIRECT" in the last 72 hours. Thyroid Function Tests: No results for input(s): "TSH", "T4TOTAL", "FREET4", "T3FREE", "THYROIDAB" in the last 72 hours. Anemia Panel: No results for input(s): "VITAMINB12", "FOLATE", "FERRITIN", "TIBC", "IRON", "RETICCTPCT" in the last 72 hours. Sepsis Labs: Recent Labs  Lab 07/22/22 1010 07/22/22 1236  LATICACIDVEN 1.1 1.2  Recent Results (from the past 240 hour(s))  SARS Coronavirus 2 by RT PCR (hospital order, performed in [facility] hospital lab) *cepheid single result test* Anterior Nasal Swab     Status: Abnormal   Collection Time: 07/22/22 10:07 AM   Specimen: Anterior Nasal Swab  Result Value Ref Range Status   SARS Coronavirus 2 by RT PCR POSITIVE (A) NEGATIVE Final    Comment: (NOTE) SARS-CoV-2 target nucleic acids are DETECTED  SARS-CoV-2 RNA is generally detectable in upper respiratory specimens  during the acute phase of infection.  Positive results are indicative  of the presence of the identified virus, but do not rule out bacterial infection or co-infection with other pathogens not detected by the test.  Clinical correlation with patient history and  other diagnostic information is necessary to determine patient infection status.  The expected result is negative.  Fact Sheet for Patients:   RoadLapTop.co.za   Fact Sheet for Healthcare Providers:   http://kim-miller.com/    This test is not yet approved or  cleared by the [place] FDA and  has been authorized for detection and/or diagnosis of SARS-CoV-2 by FDA under an Emergency Use Authorization (EUA).  This EUA will remain in effect (meaning this test can be used) for the duration of  the COVID-19 declaration under Section 564(b)(1)  of the Act, 21 U.S.C. section 360-bbb-3(b)(1), unless the authorization is terminated or revoked sooner.   Performed at [facility], [address] [place], [place] [address]   Urine Culture     Status: Abnormal   Collection Time: 07/22/22 12:36 PM   Specimen: Urine, Clean Catch  Result Value Ref Range Status   Specimen Description   Final    URINE, CLEAN CATCH Performed at [facility], [address] [place], [place] [address]    Special Requests   Final    NONE Performed at [facility], [address] [place], [place] [address]    Culture >=100,000 COLONIES/mL ENTEROCOCCUS FAECALIS (A)  Final   Report Status 07/25/2022 FINAL  Final   Organism ID, Bacteria ENTEROCOCCUS FAECALIS (A)  Final      Susceptibility   Enterococcus faecalis - MIC*    AMPICILLIN <=2 SENSITIVE Sensitive     NITROFURANTOIN <=16 SENSITIVE Sensitive     VANCOMYCIN 1 SENSITIVE Sensitive     * >=100,000 COLONIES/mL ENTEROCOCCUS FAECALIS  MRSA Next Gen by PCR, Nasal     Status: Abnormal   Collection Time: 07/23/22 12:25 PM   Specimen: Nasal Mucosa; Nasal Swab  Result Value Ref Range Status   MRSA by PCR Next Gen DETECTED (A) NOT DETECTED Final    Comment: RESULT CALLED TO, READ BACK BY AND VERIFIED WITH: CATHRINE CLAYTON @1353  07/23/22 MJU (NOTE) The GeneXpert MRSA Assay (FDA approved for NASAL specimens only), is one component of a comprehensive MRSA colonization surveillance program. It is not intended to diagnose MRSA infection nor to guide or monitor treatment for MRSA infections. Test performance is not FDA approved in patients less than 21 years old. Performed at [facility], [address] [place], [place] [address]   C Difficile Quick Screen w PCR reflex     Status: Abnormal   Collection Time: 07/24/22  1:44 AM   Specimen: STOOL  Result Value Ref Range Status   C Diff antigen POSITIVE (A) NEGATIVE Final   C Diff toxin NEGATIVE NEGATIVE Final   C Diff interpretation Results are indeterminate. See PCR results.  Final  Comment: Performed at [facility], [address] [place], [place] [address]  C. Diff by PCR, Reflexed     Status: Abnormal   Collection Time: 07/24/22  1:44 AM  Result Value Ref Range Status   Toxigenic C. Difficile by PCR POSITIVE (A) NEGATIVE Final    Comment: Positive for toxigenic C. difficile with little to no toxin production. Only treat if clinical presentation suggests symptomatic illness. Performed at [facility], [address] [place], [place] [address]          Radiology Studies: No results found.      Scheduled Meds:  aspirin  81 mg Oral Daily   atorvastatin  40 mg Oral QPM   carvedilol  3.125 mg Oral BID WC   enoxaparin (LOVENOX) injection  40 mg Subcutaneous Q24H   fidaxomicin  200 mg Oral BID   Ipratropium-Albuterol  2 puff Inhalation QID   mouth rinse  15 mL Mouth Rinse 4 times per day   phosphorus  500 mg Oral BID   predniSONE  40 mg Oral Q breakfast   Continuous Infusions:  sodium chloride Stopped (07/25/22 1818)     LOS: 5 days    Sidney Ace, MD [facility]   If 7PM-7AM, please contact night-coverage  07/28/2022, 1:12 PM 1

## 2022-07-28 NOTE — Progress Notes (Signed)
Physical Therapy Treatment Patient Details Name: Yolanda Moore MRN: [id number] DOB: Aug 11, 1946 Today's Date: 07/28/2022   History of Present Illness Patient is a 76 year old female with Acute respiratory failure with hypoxia, COVID-19, Acute metabolic encephalopathy, UTI, C. difficile colitis. History of hypertension, hyperlipidemia, dementia, CKD stage IIIb, coronary artery disease  and from independent living facility.    PT Comments    Patient confused but cooperative with PT session. The patient needs assistance for bed mobility and sit to stand transfers. Multiple standing bouts performed with lifting assistance required. The patient declined ambulation today. Recommendation for SNF remains appropriate. Recommend to continue PT while in the hospital to maximize independence and decrease caregiver burden.    Recommendations for follow up therapy are one component of a multi-disciplinary discharge planning process, led by the attending physician.  Recommendations may be updated based on patient status, additional functional criteria and insurance authorization.  Follow Up Recommendations  Skilled nursing-short term rehab (<3 hours/day) Can patient physically be transported by private vehicle: Yes   Assistance Recommended at Discharge Frequent or constant Supervision/Assistance  Patient can return home with the following A lot of help with walking and/or transfers;A lot of help with bathing/dressing/bathroom;Help with stairs or ramp for entrance;Assist for transportation;Assistance with cooking/housework;Direct supervision/assist for medications management   Equipment Recommendations  None recommended by PT    Recommendations for Other Services       Precautions / Restrictions Precautions Precautions: Fall Restrictions Weight Bearing Restrictions: No     Mobility  Bed Mobility Overal bed mobility: Needs Assistance Bed Mobility: Supine to Sit, Sit to Supine     Supine to sit:  Min assist Sit to supine: Min assist   General bed mobility comments: assistance mostly for trunk support. verbal cues for technique, task initiation    Transfers Overall transfer level: Needs assistance Equipment used: Rolling walker (2 wheels) Transfers: Sit to/from Stand Sit to Stand: Min assist           General transfer comment: 4 standing bouts performed with lifting assistance required for standing. verbal cues for anterior weight shifting and hand placement to stand    Ambulation/Gait               General Gait Details: patient declined ambulation today, no reason specified   Stairs             Wheelchair Mobility    Modified Rankin (Stroke Patients Only)       Balance Overall balance assessment: Needs assistance Sitting-balance support: Feet supported Sitting balance-Leahy Scale: Fair     Standing balance support: During functional activity, Single extremity supported Standing balance-Leahy Scale: Fair Standing balance comment: Min guard for safety                            Cognition Arousal/Alertness: Awake/alert Behavior During Therapy: WFL for tasks assessed/performed, Flat affect Overall Cognitive Status: History of cognitive impairments - at baseline                                 General Comments: cues for sequencing with increased time for processing        Exercises      General Comments General comments (skin integrity, edema, etc.): patient was incontinent of bowel with maximal assistance required for peri-care. nurse tech present at end of session to give patient a bath  Pertinent Vitals/Pain Pain Assessment Pain Assessment: No/denies pain    Home Living                          Prior Function            PT Goals (current goals can now be found in the care plan section) Acute Rehab PT Goals Patient Stated Goal: patient unable to participate with goal setting PT Goal  Formulation: Patient unable to participate in goal setting Time For Goal Achievement: 08/08/22 Potential to Achieve Goals: Fair Progress towards PT goals: Progressing toward goals    Frequency    Min 2X/week      PT Plan Current plan remains appropriate    Co-evaluation              AM-PAC PT "6 Clicks" Mobility   Outcome Measure  Help needed turning from your back to your side while in a flat bed without using bedrails?: A Little Help needed moving from lying on your back to sitting on the side of a flat bed without using bedrails?: A Little Help needed moving to and from a bed to a chair (including a wheelchair)?: A Lot Help needed standing up from a chair using your arms (e.g., wheelchair or bedside chair)?: A Lot Help needed to walk in hospital room?: A Lot Help needed climbing 3-5 steps with a railing? : Total 6 Click Score: 13    End of Session   Activity Tolerance: Patient tolerated treatment well Patient left: in bed;with call bell/phone within reach;with nursing/sitter in room (nurse tech in the room) Nurse Communication: Mobility status (discussed with nurse tech) PT Visit Diagnosis: Unsteadiness on feet (R26.81);Muscle weakness (generalized) (M62.81)     Time: 7408-1448 PT Time Calculation (min) (ACUTE ONLY): 18 min  Charges:  $Therapeutic Activity: 8-22 mins                     Minna Merritts, PT, MPT    Percell Locus 07/28/2022, 12:09 PM

## 2022-07-29 DIAGNOSIS — J9601 Acute respiratory failure with hypoxia: Secondary | ICD-10-CM | POA: Diagnosis not present

## 2022-07-29 LAB — CBC WITH DIFFERENTIAL/PLATELET
Abs Immature Granulocytes: 0.7 10*3/uL — ABNORMAL HIGH (ref 0.00–0.07)
Basophils Absolute: 0.1 10*3/uL (ref 0.0–0.1)
Basophils Relative: 1 %
Eosinophils Absolute: 0.1 10*3/uL (ref 0.0–0.5)
Eosinophils Relative: 1 %
HCT: 40.9 % (ref 36.0–46.0)
Hemoglobin: 13.2 g/dL (ref 12.0–15.0)
Immature Granulocytes: 6 %
Lymphocytes Relative: 39 %
Lymphs Abs: 4.3 10*3/uL — ABNORMAL HIGH (ref 0.7–4.0)
MCH: 27.2 pg (ref 26.0–34.0)
MCHC: 32.3 g/dL (ref 30.0–36.0)
MCV: 84.2 fL (ref 80.0–100.0)
Monocytes Absolute: 0.9 10*3/uL (ref 0.1–1.0)
Monocytes Relative: 8 %
Neutro Abs: 4.9 10*3/uL (ref 1.7–7.7)
Neutrophils Relative %: 45 %
Platelets: 401 10*3/uL — ABNORMAL HIGH (ref 150–400)
RBC: 4.86 MIL/uL (ref 3.87–5.11)
RDW: 15 % (ref 11.5–15.5)
Smear Review: ADEQUATE
WBC: 11 10*3/uL — ABNORMAL HIGH (ref 4.0–10.5)
nRBC: 0 % (ref 0.0–0.2)

## 2022-07-29 LAB — BASIC METABOLIC PANEL
Anion gap: 7 (ref 5–15)
BUN: 25 mg/dL — ABNORMAL HIGH (ref 8–23)
CO2: 24 mmol/L (ref 22–32)
Calcium: 8.5 mg/dL — ABNORMAL LOW (ref 8.9–10.3)
Chloride: 110 mmol/L (ref 98–111)
Creatinine, Ser: 1.01 mg/dL — ABNORMAL HIGH (ref 0.44–1.00)
GFR, Estimated: 58 mL/min — ABNORMAL LOW (ref >60)
Glucose, Bld: 104 mg/dL — ABNORMAL HIGH (ref 70–99)
Potassium: 3.7 mmol/L (ref 3.5–5.1)
Sodium: 141 mmol/L (ref 135–145)

## 2022-07-29 MED ORDER — ADULT MULTIVITAMIN W/MINERALS CH
1.0000 | ORAL_TABLET | Freq: Every day | ORAL | Status: DC
  Administered 2022-07-30 – 2022-08-02 (×4): 1 via ORAL
  Filled 2022-07-29 (×4): qty 1

## 2022-07-29 MED ORDER — ENSURE ENLIVE PO LIQD
237.0000 mL | Freq: Three times a day (TID) | ORAL | Status: DC
  Administered 2022-07-29 – 2022-08-02 (×11): 237 mL via ORAL

## 2022-07-29 MED ORDER — PREDNISONE 20 MG PO TABS
20.0000 mg | ORAL_TABLET | Freq: Every day | ORAL | Status: DC
  Administered 2022-07-30 – 2022-08-02 (×4): 20 mg via ORAL
  Filled 2022-07-29 (×4): qty 1

## 2022-07-29 NOTE — Progress Notes (Signed)
Occupational Therapy Treatment Patient Details Name: Yolanda Moore MRN: [id number] DOB: [birth year] Today's Date: 07/29/2022   History of present illness Patient is a 76 year old female with Acute respiratory failure with hypoxia, [id number], Acute metabolic encephalopathy, UTI, C. difficile colitis. History of hypertension, hyperlipidemia, dementia, CKD stage IIIb, coronary artery disease  and from independent living facility.   OT comments  Upon entering the room, pt supine in bed and agreeable to the OT intervention. Pt on RA during session with O2 saturation at 100%. Pt needing increased time and cuing for hand placement and technique for mobility tasks this session. Pt stands with min A and transfers with RW with min guard to recliner chair. Chair alarm activated, all needs within reach, and call bell reviewed. Pt continues to benefit from OT intervention.    Recommendations for follow up therapy are one component of a multi-disciplinary discharge planning process, led by the attending physician.  Recommendations may be updated based on patient status, additional functional criteria and insurance authorization.    Follow Up Recommendations  Skilled nursing-short term rehab (<3 hours/day)    Assistance Recommended at Discharge Frequent or constant Supervision/Assistance  Patient can return home with the following  A lot of help with walking and/or transfers;A lot of help with bathing/dressing/bathroom;Assist for transportation;Help with stairs or ramp for entrance;Direct supervision/assist for medications management;Assistance with cooking/housework;Direct supervision/assist for financial management   Equipment Recommendations  Other (comment) (defer to next venue of care)       Precautions / Restrictions Precautions Precautions: Fall Restrictions Weight Bearing Restrictions: No       Mobility Bed Mobility Overal bed mobility: Needs Assistance Bed Mobility: Supine to Sit      Supine to sit: Supervision     General bed mobility comments: increased cuing for technique and hand placement    Transfers Overall transfer level: Needs assistance Equipment used: Rolling walker (2 wheels) Transfers: Sit to/from Stand Sit to Stand: Min assist                 Balance Overall balance assessment: Needs assistance Sitting-balance support: Feet supported Sitting balance-Leahy Scale: Good Sitting balance - Comments: seated EOB   Standing balance support: During functional activity, Single extremity supported, Reliant on assistive device for balance                               ADL either performed or assessed with clinical judgement    Extremity/Trunk Assessment Upper Extremity Assessment Upper Extremity Assessment: Generalized weakness   Lower Extremity Assessment Lower Extremity Assessment: Generalized weakness        Vision Patient Visual Report: No change from baseline            Cognition Arousal/Alertness: Awake/alert Behavior During Therapy: WFL for tasks assessed/performed, Flat affect Overall Cognitive Status: History of cognitive impairments - at baseline                                 General Comments: cues for sequencing with increased time for processing                   Pertinent Vitals/ Pain       Pain Assessment Pain Assessment: Faces Faces Pain Scale: No hurt         Frequency  Min 2X/week        Progress Toward Goals  OT Goals(current goals can now be found in the care plan section)  Progress towards OT goals: Progressing toward goals  Acute Rehab OT Goals Patient Stated Goal: none stated OT Goal Formulation: With patient Time For Goal Achievement: 08/08/22 Potential to Achieve Goals: Good  Plan Discharge plan remains appropriate;Frequency remains appropriate       AM-PAC OT "6 Clicks" Daily Activity     Outcome Measure   Help from another person eating meals?: A  Little Help from another person taking care of personal grooming?: A Little Help from another person toileting, which includes using toliet, bedpan, or urinal?: A Lot Help from another person bathing (including washing, rinsing, drying)?: A Lot Help from another person to put on and taking off regular upper body clothing?: A Little Help from another person to put on and taking off regular lower body clothing?: A Lot 6 Click Score: 15    End of Session Equipment Utilized During Treatment: Rolling walker (2 wheels)  OT Visit Diagnosis: Unsteadiness on feet (R26.81);History of falling (Z91.81);Muscle weakness (generalized) (M62.81);Other symptoms and signs involving cognitive function   Activity Tolerance Patient tolerated treatment well   Patient Left with call bell/phone within reach;in chair;with chair alarm set   Nurse Communication Mobility status        Time: 9485-4627 OT Time Calculation (min): 19 min  Charges: OT General Charges $OT Visit: 1 Visit OT Treatments $Therapeutic Activity: 8-22 mins  Darleen Crocker, MS, OTR/L , CBIS ascom [phone]  07/29/22, 11:21 AM

## 2022-07-29 NOTE — Plan of Care (Signed)
  Problem: Health Behavior/Discharge Planning: Goal: Ability to manage health-related needs will improve Outcome: Progressing   

## 2022-07-29 NOTE — Progress Notes (Signed)
PROGRESS NOTE    Yolanda Moore  [id number] DOB: 11-25-1945 DOA: 07/22/2022 PCP: Dion Body, MD    Brief Narrative:  76 year old female with chronic kidney disease, dementia, hypertension, CAD.  A few months ago she was admitted with sepsis secondary to E. coli.  She was brought in with confusion.  She was found to have COVID-19 infection and a urinary tract infection.  She was started on antibiotics.  She did have an aspiration event on 07/23/2022 and was transferred to the ICU because she was gurgling on secretions.  Patient was kept n.p.o. that day.  She was placed on 4 L of oxygen.  She did better the next day and was tolerating diet and transferred back to the floor.  She was also diagnosed with C. difficile colitis, present on admission and started on Dificid.  Enterococcus growing out of the urine culture.  Patient switched from Unasyn to Augmentin.  Patient completed 3 days of remdesivir for COVID-19 infection.  Currently off oxygen.  Physical therapy recommending rehab.  Will remain in isolation until able to go out to rehab.   Assessment & Plan:   Principal Problem:   Acute respiratory failure with hypoxia (HCC) Active Problems:   COVID-19   C. difficile colitis   Acute metabolic encephalopathy   Enterococcus UTI   Essential hypertension   Chronic kidney disease, stage 3b (HCC)   Dementia without behavioral disturbance (HCC)   Weakness   Hyperlipidemia   Altered mental status   Acute decompensated heart failure (HCC)   Chronic diastolic CHF (congestive heart failure) (HCC)   Hypokalemia   Hypernatremia  * Acute respiratory failure with hypoxia (HCC) Pulse ox 77% on room air yesterday after likely aspiration episode.  Was on 4 L of oxygen with the aspiration episode.  Currently off oxygen at this point.  Continue Augmentin for completion of course for aspiration episode and UTI.  No further antibiotics at this time.  Monitor vitals and fever curve.   C. difficile  colitis Present on admission.  Continue Dificid at least 7 days after antibiotics stopped.   COVID-19 COVID-19 infection.  Completed 3 days of remdesivir.  Continue prednisone for now.  Start tapering tomorrow  Acute metabolic encephalopathy Mental status much improved.  Appears to be back to baseline   Enterococcus UTI Unasyn switched over to Augmentin.   Essential hypertension Low-dose Coreg   Hypernatremia improved Sodium went up to 149.  Received fluid bolus of D5 water.  Sodium back in reference range.   Hypokalemia Monitor and replace as necessary   Chronic diastolic CHF (congestive heart failure) (HCC) Last EF 65 to 70%.  Patient received a dose of Lasix on admission and a dose of Lasix 2 days ago.  Hold off on further Lasix dosing.  Currently on Coreg.  This is likely more secondary to respiratory issues rather than heart issues   Hyperlipidemia Restarted Lipitor   Weakness Rehab recommended by PT and OT   Dementia without behavioral disturbance (HCC) Hold Aricept   Chronic kidney disease, stage 3b (HCC) Cr stable   DVT prophylaxis: SQ Lovenox Code Status: Full Family Communication: Daughter Sharren Bridge [phone] on 9/20, 9/21, 9/22 Disposition Plan: Status is: Inpatient Remains inpatient appropriate because: Unsafe discharge plan.  Medically stable.  Awaiting.  For skilled nursing facility placement.  Discharge 9/26.   Level of care: Med-Surg  Consultants:  None  Procedures:  None  Antimicrobials: Augmentin   Subjective: Seen and examined.  Flattened affect.  Resting comfortably  in bed.  No visible distress  Objective: Vitals:   07/28/22 0826 07/28/22 1603 07/29/22 0415 07/29/22 0806  BP: (!) 167/72 (!) 145/69 (!) 140/69 (!) 158/81  Pulse: (!) 57 66 64 (!) 57  Resp: 18 18 16 16   Temp: 98.3 F (36.8 C) 98.1 F (36.7 C) 98.2 F (36.8 C) 97.7 F (36.5 C)  TempSrc: Oral Oral Oral Oral  SpO2: 100% 100% 100% 98%  Weight:      Height:         Intake/Output Summary (Last 24 hours) at 07/29/2022 1251 Last data filed at 07/29/2022 0300 Gross per 24 hour  Intake 100 ml  Output 900 ml  Net -800 ml   Filed Weights   07/22/22 1400 07/23/22 1222  Weight: 69.9 kg 65.2 kg    Examination:  General exam: No acute distress Respiratory system: Bibasilar crackles.  Poor respiratory effort.  Normal work of breathing.  Room air Cardiovascular system: S1-S2, RRR, no murmurs, no pedal edema Gastrointestinal system: Soft, NT/ND, hyperactive bowel sounds Central nervous system: Alert and oriented x2. No focal neurological deficits. Extremities: Symmetric 5 x 5 power. Skin: No rashes, lesions or ulcers Psychiatry: Judgement and insight appear impaired. Mood & affect flattened.     Data Reviewed: I have personally reviewed following labs and imaging studies  CBC: Recent Labs  Lab 07/23/22 0519 07/24/22 0419 07/25/22 0636 07/26/22 0623 07/29/22 0853  WBC 11.1* 12.2* 14.8* 13.9* 11.0*  NEUTROABS  --   --   --   --  4.9  HGB 14.2 14.8 13.5 12.5 13.2  HCT 45.6 46.8* 42.0 39.6 40.9  MCV 87.0 85.2 84.0 85.2 84.2  PLT 302 313 369 395 [id number]*   Basic Metabolic Panel: Recent Labs  Lab 07/24/22 0419 07/25/22 0636 07/26/22 0623 07/27/22 1000 07/29/22 0853  NA 139 145 149* 145 141  K 3.8 3.2* 3.3* 4.1 3.7  CL 104 112* 117* 114* 110  CO2 24 25 26 22 24   GLUCOSE 145* 147* 171* 130* 104*  BUN 20 38* 34* 29* 25*  CREATININE 1.19* 1.36* 1.27* 1.01* 1.01*  CALCIUM 9.1 9.0 8.8* 9.0 8.5*  MG 1.9  --   --  2.8*  --   PHOS 3.5  --   --  1.9*  --    GFR: Estimated Creatinine Clearance: 40.6 mL/min (A) (by C-G formula based on SCr of 1.01 mg/dL (H)). Liver Function Tests: Recent Labs  Lab 07/26/22 0623  AST 17  ALT 20  ALKPHOS 46  BILITOT 0.3  PROT 6.5  ALBUMIN 3.0*   No results for input(s): "LIPASE", "AMYLASE" in the last 168 hours. No results for input(s): "AMMONIA" in the last 168 hours. Coagulation Profile: No results  for input(s): "INR", "PROTIME" in the last 168 hours. Cardiac Enzymes: No results for input(s): "CKTOTAL", "CKMB", "CKMBINDEX", "TROPONINI" in the last 168 hours. BNP (last 3 results) No results for input(s): "PROBNP" in the last 8760 hours. HbA1C: No results for input(s): "HGBA1C" in the last 72 hours. CBG: Recent Labs  Lab 07/23/22 1215 07/23/22 1947 07/23/22 2319 07/24/22 0355 07/24/22 0751  GLUCAP 149* 157* 136* 133* 137*   Lipid Profile: No results for input(s): "CHOL", "HDL", "LDLCALC", "TRIG", "CHOLHDL", "LDLDIRECT" in the last 72 hours. Thyroid Function Tests: No results for input(s): "TSH", "T4TOTAL", "FREET4", "T3FREE", "THYROIDAB" in the last 72 hours. Anemia Panel: No results for input(s): "VITAMINB12", "FOLATE", "FERRITIN", "TIBC", "IRON", "RETICCTPCT" in the last 72 hours. Sepsis Labs: No results for input(s): "PROCALCITON", "LATICACIDVEN" in the  last 168 hours.   Recent Results (from the past 240 hour(s))  SARS Coronavirus 2 by RT PCR (hospital order, performed in [facility] hospital lab) *cepheid single result test* Anterior Nasal Swab     Status: Abnormal   Collection Time: 07/22/22 10:07 AM   Specimen: Anterior Nasal Swab  Result Value Ref Range Status   SARS Coronavirus 2 by RT PCR POSITIVE (A) NEGATIVE Final    Comment: (NOTE) SARS-CoV-2 target nucleic acids are DETECTED  SARS-CoV-2 RNA is generally detectable in upper respiratory specimens  during the acute phase of infection.  Positive results are indicative  of the presence of the identified virus, but do not rule out bacterial infection or co-infection with other pathogens not detected by the test.  Clinical correlation with patient history and  other diagnostic information is necessary to determine patient infection status.  The expected result is negative.  Fact Sheet for Patients:   https://www.patel.info/   Fact Sheet for Healthcare Providers:    https://hall.com/    This test is not yet approved or cleared by the [place] FDA and  has been authorized for detection and/or diagnosis of SARS-CoV-2 by FDA under an Emergency Use Authorization (EUA).  This EUA will remain in effect (meaning this test can be used) for the duration of  the COVID-19 declaration under Section 564(b)(1)  of the Act, 21 U.S.C. section 360-bbb-3(b)(1), unless the authorization is terminated or revoked sooner.   Performed at [facility], [address], [place], [place] [address]   Urine Culture     Status: Abnormal   Collection Time: 07/22/22 12:36 PM   Specimen: Urine, Clean Catch  Result Value Ref Range Status   Specimen Description   Final    URINE, CLEAN CATCH Performed at [facility], [address] [place], [place] [address]    Special Requests   Final    NONE Performed at [facility], [address] [place], [place] [address]    Culture >=100,000 COLONIES/mL ENTEROCOCCUS FAECALIS (A)  Final   Report Status 07/25/2022 FINAL  Final   Organism ID, Bacteria ENTEROCOCCUS FAECALIS (A)  Final      Susceptibility   Enterococcus faecalis - MIC*    AMPICILLIN <=2 SENSITIVE Sensitive     NITROFURANTOIN <=16 SENSITIVE Sensitive     VANCOMYCIN 1 SENSITIVE Sensitive     * >=100,000 COLONIES/mL ENTEROCOCCUS FAECALIS  MRSA Next Gen by PCR, Nasal     Status: Abnormal   Collection Time: 07/23/22 12:25 PM   Specimen: Nasal Mucosa; Nasal Swab  Result Value Ref Range Status   MRSA by PCR Next Gen DETECTED (A) NOT DETECTED Final    Comment: RESULT CALLED TO, READ BACK BY AND VERIFIED WITH: CATHRINE CLAYTON @1353  07/23/22 MJU (NOTE) The GeneXpert MRSA Assay (FDA approved for NASAL specimens only), is one component of a comprehensive MRSA colonization surveillance program. It is not intended to diagnose MRSA infection nor to guide or monitor treatment for MRSA infections. Test performance is  not FDA approved in patients less than 55 years old. Performed at [facility], [place], [place] [address]   C Difficile Quick Screen w PCR reflex     Status: Abnormal   Collection Time: 07/24/22  1:44 AM   Specimen: STOOL  Result Value Ref Range Status   C Diff antigen POSITIVE (A) NEGATIVE Final   C Diff toxin NEGATIVE NEGATIVE Final   C Diff interpretation Results are indeterminate. See PCR results.  Final    Comment: Performed at [facility], [address] [place], [place] [address]  C. Diff by PCR, Reflexed     Status: Abnormal   Collection Time: 07/24/22  1:44 AM  Result Value Ref Range Status   Toxigenic C. Difficile by PCR POSITIVE (A) NEGATIVE Final    Comment: Positive for toxigenic C. difficile with little to no toxin production. Only treat if clinical presentation suggests symptomatic illness. Performed at [facility], [address], [place], [place] [address]          Radiology Studies: No results found.      Scheduled Meds:  aspirin  81 mg Oral Daily   atorvastatin  40 mg Oral QPM   carvedilol  3.125 mg Oral BID WC   enoxaparin (LOVENOX) injection  40 mg Subcutaneous Q24H   fidaxomicin  200 mg Oral BID   Ipratropium-Albuterol  2 puff Inhalation QID   mouth rinse  15 mL Mouth Rinse 4 times per day   predniSONE  40 mg Oral Q breakfast   Continuous Infusions:  sodium chloride Stopped (07/25/22 1818)     LOS: 6 days    Sidney Ace, MD [facility]   If 7PM-7AM, please contact night-coverage  07/29/2022, 12:51 PM 1

## 2022-07-29 NOTE — Plan of Care (Signed)
  Problem: Education: Goal: Knowledge of General Education information will improve Description: Including pain rating scale, medication(s)/side effects and non-pharmacologic comfort measures Outcome: Not Progressing   Problem: Health Behavior/Discharge Planning: Goal: Ability to manage health-related needs will improve Outcome: Not Progressing   Problem: Clinical Measurements: Goal: Ability to maintain clinical measurements within normal limits will improve Outcome: Progressing Goal: Respiratory complications will improve Outcome: Progressing Goal: Cardiovascular complication will be avoided Outcome: Progressing   Problem: Activity: Goal: Risk for activity intolerance will decrease Outcome: Progressing   Problem: Nutrition: Goal: Adequate nutrition will be maintained Outcome: Progressing

## 2022-07-29 NOTE — Progress Notes (Signed)
Physical Therapy Treatment Patient Details Name: Yolanda Moore MRN: [id number] DOB: 1946-02-03 Today's Date: 07/29/2022   History of Present Illness Patient is a 76 year old female with Acute respiratory failure with hypoxia, [id number], Acute metabolic encephalopathy, UTI, C. difficile colitis. History of hypertension, hyperlipidemia, dementia, CKD stage IIIb, coronary artery disease  and from independent living facility.    PT Comments    Patient sleeping in the recliner chair on arrival to the room and requested to return to bed. She required physical assistance for standing and bed mobility. Cues required for sequencing and task initiation with slow processing overall. She participated with standing level and bed level exercises. She declined ambulation, no reason given. Recommend to continue PT to maximize independence and decrease caregiver burden. SNF recommended at discharge.    Recommendations for follow up therapy are one component of a multi-disciplinary discharge planning process, led by the attending physician.  Recommendations may be updated based on patient status, additional functional criteria and insurance authorization.  Follow Up Recommendations  Skilled nursing-short term rehab (<3 hours/day) Can patient physically be transported by private vehicle: Yes   Assistance Recommended at Discharge Frequent or constant Supervision/Assistance  Patient can return home with the following A lot of help with walking and/or transfers;A lot of help with bathing/dressing/bathroom;Help with stairs or ramp for entrance;Assist for transportation;Assistance with cooking/housework;Direct supervision/assist for medications management   Equipment Recommendations  None recommended by PT    Recommendations for Other Services       Precautions / Restrictions Precautions Precautions: Fall Restrictions Weight Bearing Restrictions: No     Mobility  Bed Mobility Overal bed mobility: Needs  Assistance         Sit to supine: Mod assist   General bed mobility comments: assistance for trunk and BLE support. cues for sequending and task initiation. increased time required    Transfers Overall transfer level: Needs assistance Equipment used: None Transfers: Sit to/from Stand Sit to Stand: Min assist           General transfer comment: steadying assistance provided with standing. cues for hand placement    Ambulation/Gait               General Gait Details: patient declined ambulation today, no reason specified   Stairs             Wheelchair Mobility    Modified Rankin (Stroke Patients Only)       Balance                                            Cognition Arousal/Alertness: Awake/alert Behavior During Therapy: WFL for tasks assessed/performed Overall Cognitive Status: History of cognitive impairments - at baseline                                 General Comments: cues for sequencing with increased time for processing        Exercises General Exercises - Lower Extremity Ankle Circles/Pumps: AAROM, PROM, Strengthening, Both, 10 reps, Supine Short Arc Quad: AAROM, Strengthening, Both, 10 reps, Supine, PROM Hip Flexion/Marching: AROM, Strengthening, Both, 5 reps, Standing Other Exercises Other Exercises: verbal and visual cues for exercise participation    General Comments        Pertinent Vitals/Pain Pain Assessment Pain Assessment: No/denies pain  Home Living                          Prior Function            PT Goals (current goals can now be found in the care plan section) Acute Rehab PT Goals Patient Stated Goal: patient unable to participate with goal setting PT Goal Formulation: Patient unable to participate in goal setting Time For Goal Achievement: 08/08/22 Potential to Achieve Goals: Fair Progress towards PT goals: Progressing toward goals    Frequency    Min  2X/week      PT Plan Current plan remains appropriate    Co-evaluation              AM-PAC PT "6 Clicks" Mobility   Outcome Measure  Help needed turning from your back to your side while in a flat bed without using bedrails?: A Little Help needed moving from lying on your back to sitting on the side of a flat bed without using bedrails?: A Little Help needed moving to and from a bed to a chair (including a wheelchair)?: A Lot Help needed standing up from a chair using your arms (e.g., wheelchair or bedside chair)?: A Lot Help needed to walk in hospital room?: A Lot Help needed climbing 3-5 steps with a railing? : Total 6 Click Score: 13    End of Session   Activity Tolerance: Patient tolerated treatment well Patient left: in bed;with call bell/phone within reach;with bed alarm set Nurse Communication: Mobility status (updated on secure chat that patient was back to bed, alarm on) PT Visit Diagnosis: Unsteadiness on feet (R26.81);Muscle weakness (generalized) (M62.81)     Time: 8768-1157 PT Time Calculation (min) (ACUTE ONLY): 20 min  Charges:  $Therapeutic Exercise: 8-22 mins                     Minna Merritts, PT, MPT    Percell Locus 07/29/2022, 2:32 PM

## 2022-07-29 NOTE — Progress Notes (Signed)
Initial Nutrition Assessment  DOCUMENTATION CODES:   Not applicable  INTERVENTION:   Ensure Enlive po TID, each supplement provides 350 kcal and 20 grams of protein.  Magic cup TID with meals, each supplement provides 290 kcal and 9 grams of protein  MVI po daily   Pt at high refeed risk; recommend monitor potassium, magnesium and phosphorus labs daily until stable  NUTRITION DIAGNOSIS:   Increased nutrient needs related to catabolic illness (COVID 19) as evidenced by estimated needs.  GOAL:   Patient will meet greater than or equal to 90% of their needs  MONITOR:   PO intake, Supplement acceptance, Labs, Weight trends, Skin, I & O's  REASON FOR ASSESSMENT:   Consult Assessment of nutrition requirement/status  ASSESSMENT:   76 y/o female with h/o HTN, CKD III, dementia, HLD, MI and CHF who is admitted with COVID 19, C-diff and UTI.  Visited pt's room today. Pt lethargic and difficult to arouse. Family member at bedside reports pt with poor oral intake and holding food in her mouth. Pt's lunch tray is on the side table with only bites taken from it. Pt did eat a few bites of Magic Cup. Pt with poor dentition and is noted to wear dentures. Pt seen by SLP and placed on a dysphagia 2/thin liquid diet. RD will add supplements and MVI to help pt meet her estimated needs. Pt is at high refeed risk. Per chart, pt is down 10lbs(7%) over the past month; this is significant weight loss.    Medications reviewed and include: aspirin, lovenox, dificid, prednisone  Labs reviewed: K 3.7 wnl, BUN 25(H), creat 1.01(H) P 1.9(L), Mg 2.8(H)- 9/20 Wbc- 11.0(H)  NUTRITION - FOCUSED PHYSICAL EXAM:  Flowsheet Row Most Recent Value  Orbital Region No depletion  Upper Arm Region Mild depletion  Thoracic and Lumbar Region No depletion  Buccal Region No depletion  Temple Region Mild depletion  Clavicle Bone Region Mild depletion  Clavicle and Acromion Bone Region Mild depletion  Scapular  Bone Region No depletion  Dorsal Hand No depletion  Patellar Region No depletion  Anterior Thigh Region No depletion  Posterior Calf Region No depletion  Edema (RD Assessment) Mild  Hair Reviewed  Eyes Reviewed  Mouth Reviewed  Skin Reviewed  Nails Reviewed   Diet Order:   Diet Order             DIET DYS 2 Room service appropriate? Yes with Assist; Fluid consistency: Thin  Diet effective now                  EDUCATION NEEDS:   Not appropriate for education at this time  Skin:  Skin Assessment: Reviewed RN Assessment  Last BM:  9/21- TYPE 6  Height:   Ht Readings from Last 1 Encounters:  07/23/22 5' (1.524 m)    Weight:   Wt Readings from Last 1 Encounters:  07/23/22 65.2 kg    Ideal Body Weight:  45.45 kg  BMI:  Body mass index is 28.07 kg/m.  Estimated Nutritional Needs:   Kcal:  1500-1700kcal/day  Protein:  75-85g/day  Fluid:  1.3-1.5L/day  Koleen Distance MS, RD, LDN Please refer to [facility] for RD and/or RD on-call/weekend/after hours pager

## 2022-07-29 NOTE — TOC Progression Note (Signed)
Transition of Care [facility]) - Progression Note    Patient Details  Name: DENEENE TARVER MRN: [id number] Date of Birth: 1945-11-14  Transition of [facility]) CM/SW [place], LCSW Phone Number: 07/29/2022, 5:01 PM  Clinical Narrative:    Called daughter [name] Capers, presented current bed offers at [facility], [facility], and [facility]. She asked about [facility] and [facility], explained [facility] does not take [facility] does not take people from outside of their ILF/ALF. She stated out of the current bed offers she would choose [facility], but would like to see the Medicare.gov ratings. She stated she will be at bedside on Sunday to review them. Per chart review patient cannot go to SNF until 9/26 due to [place]. CSW printed list to floor for RN to leave at bedside, patient is on airborne precautions.   5:15- Call from daughter who stated she wants [facility]. Updated TOC handoff.         Expected Discharge Plan and Services                                                 Social Determinants of Health (SDOH) Interventions    Readmission Risk Interventions     No data to display

## 2022-07-30 DIAGNOSIS — J9601 Acute respiratory failure with hypoxia: Secondary | ICD-10-CM | POA: Diagnosis not present

## 2022-07-30 LAB — BASIC METABOLIC PANEL
Anion gap: 6 (ref 5–15)
BUN: 26 mg/dL — ABNORMAL HIGH (ref 8–23)
CO2: 25 mmol/L (ref 22–32)
Calcium: 9 mg/dL (ref 8.9–10.3)
Chloride: 107 mmol/L (ref 98–111)
Creatinine, Ser: 0.99 mg/dL (ref 0.44–1.00)
GFR, Estimated: 59 mL/min — ABNORMAL LOW (ref >60)
Glucose, Bld: 170 mg/dL — ABNORMAL HIGH (ref 70–99)
Potassium: 4.2 mmol/L (ref 3.5–5.1)
Sodium: 138 mmol/L (ref 135–145)

## 2022-07-30 LAB — CBC WITH DIFFERENTIAL/PLATELET
Abs Immature Granulocytes: 0.87 10*3/uL — ABNORMAL HIGH (ref 0.00–0.07)
Basophils Absolute: 0.1 10*3/uL (ref 0.0–0.1)
Basophils Relative: 1 %
Eosinophils Absolute: 0.1 10*3/uL (ref 0.0–0.5)
Eosinophils Relative: 1 %
HCT: 42.7 % (ref 36.0–46.0)
Hemoglobin: 13.7 g/dL (ref 12.0–15.0)
Immature Granulocytes: 7 %
Lymphocytes Relative: 15 %
Lymphs Abs: 1.8 10*3/uL (ref 0.7–4.0)
MCH: 27.1 pg (ref 26.0–34.0)
MCHC: 32.1 g/dL (ref 30.0–36.0)
MCV: 84.6 fL (ref 80.0–100.0)
Monocytes Absolute: 0.8 10*3/uL (ref 0.1–1.0)
Monocytes Relative: 6 %
Neutro Abs: 8.7 10*3/uL — ABNORMAL HIGH (ref 1.7–7.7)
Neutrophils Relative %: 70 %
Platelets: 445 10*3/uL — ABNORMAL HIGH (ref 150–400)
RBC: 5.05 MIL/uL (ref 3.87–5.11)
RDW: 15.2 % (ref 11.5–15.5)
Smear Review: NORMAL
WBC: 12.4 10*3/uL — ABNORMAL HIGH (ref 4.0–10.5)
nRBC: 0 % (ref 0.0–0.2)

## 2022-07-30 LAB — MAGNESIUM: Magnesium: 2.6 mg/dL — ABNORMAL HIGH (ref 1.7–2.4)

## 2022-07-30 LAB — PHOSPHORUS: Phosphorus: 3.4 mg/dL (ref 2.5–4.6)

## 2022-07-30 MED ORDER — TRAZODONE HCL 50 MG PO TABS
50.0000 mg | ORAL_TABLET | Freq: Every day | ORAL | Status: DC
  Administered 2022-07-30 – 2022-08-01 (×3): 50 mg via ORAL
  Filled 2022-07-30 (×3): qty 1

## 2022-07-30 NOTE — Progress Notes (Signed)
PROGRESS NOTE    Yolanda Moore  [id number] DOB: June 13, 1946 DOA: 07/22/2022 PCP: Dion Body, MD    Brief Narrative:  76 year old female with chronic kidney disease, dementia, hypertension, CAD.  A few months ago she was admitted with sepsis secondary to E. coli.  She was brought in with confusion.  She was found to have COVID-19 infection and a urinary tract infection.  She was started on antibiotics.  She did have an aspiration event on 07/23/2022 and was transferred to the ICU because she was gurgling on secretions.  Patient was kept n.p.o. that day.  She was placed on 4 L of oxygen.  She did better the next day and was tolerating diet and transferred back to the floor.  She was also diagnosed with C. difficile colitis, present on admission and started on Dificid.  Enterococcus growing out of the urine culture.  Patient switched from Unasyn to Augmentin.  Patient completed 3 days of remdesivir for COVID-19 infection.  Currently off oxygen.  Physical therapy recommending rehab.  Will remain in isolation until able to go out to rehab.   Assessment & Plan:   Principal Problem:   Acute respiratory failure with hypoxia (HCC) Active Problems:   COVID-19   C. difficile colitis   Acute metabolic encephalopathy   Enterococcus UTI   Essential hypertension   Chronic kidney disease, stage 3b (HCC)   Dementia without behavioral disturbance (HCC)   Weakness   Hyperlipidemia   Altered mental status   Acute decompensated heart failure (HCC)   Chronic diastolic CHF (congestive heart failure) (HCC)   Hypokalemia   Hypernatremia  * Acute respiratory failure with hypoxia (HCC) Pulse ox 77% on room air yesterday after likely aspiration episode.  Was on 4 L of oxygen with the aspiration episode.  Currently off oxygen at this point.  Continue Augmentin for completion of course for aspiration episode and UTI.  No further antibiotics at this time.  Monitor vitals and fever curve.   C. difficile  colitis Present on admission.  Continue Dificid at least 7 days after antibiotics stopped.  End date 9/26   COVID-19 COVID-19 infection.  Completed 3 days of remdesivir.  Continue prednisone for now.  Dose tapered to 20 mg daily.  Continue to taper aggressively.  Acute metabolic encephalopathy Mental status much improved.  Appears to be back to baseline   Enterococcus UTI Unasyn switched over to Augmentin.   Essential hypertension Low-dose Coreg   Hypernatremia improved Sodium went up to 149.  Received fluid bolus of D5 water.  Sodium back in reference range.   Hypokalemia Monitor and replace as necessary   Chronic diastolic CHF (congestive heart failure) (HCC) Last EF 65 to 70%.  Patient received a dose of Lasix on admission and a dose of Lasix 2 days ago.  Hold off on further Lasix dosing.  Currently on Coreg.  This is likely more secondary to respiratory issues rather than heart issues   Hyperlipidemia Restarted Lipitor   Weakness Rehab recommended by PT and OT   Dementia without behavioral disturbance (HCC) Hold Aricept   Chronic kidney disease, stage 3b (HCC) Cr stable   DVT prophylaxis: SQ Lovenox Code Status: Full Family Communication: Daughter Sharren Bridge [phone] on 9/20, 9/21, 9/22 Disposition Plan: Status is: Inpatient Remains inpatient appropriate because: Unsafe discharge plan.  Medically stable.  Awaiting.  For skilled nursing facility placement.  Discharge 9/26.  Family has chosen [facility].   Level of care: Med-Surg  Consultants:  None  Procedures:  None  Antimicrobials: Augmentin   Subjective: Seen and examined.  Flattened affect.  Resting comfortably in bed.  No visible distress  Objective: Vitals:   07/29/22 1652 07/29/22 2016 07/30/22 0510 07/30/22 0822  BP:  114/75 137/70 (!) 162/74  Pulse:  (!) 53 (!) 58 (!) 57  Resp:  20 20 14   Temp:  97.8 F (36.6 C) 97.8 F (36.6 C) 97.8 F (36.6 C)  TempSrc:  Oral Oral Oral   SpO2:  99% 100% 98%  Weight: 65.7 kg     Height:        Intake/Output Summary (Last 24 hours) at 07/30/2022 1102 Last data filed at 07/30/2022 0535 Gross per 24 hour  Intake --  Output 700 ml  Net -700 ml   Filed Weights   07/22/22 1400 07/23/22 1222 07/29/22 1652  Weight: 69.9 kg 65.2 kg 65.7 kg    Examination:  General exam: No acute distress Respiratory system: Bibasilar crackles.  Normal work of breathing.  Room air Cardiovascular system: S1-S2, RRR, no murmurs, no pedal edema Gastrointestinal system: Soft, NT/ND, hyperactive bowel sounds Central nervous system: Alert and oriented x2. No focal neurological deficits. Extremities: Symmetric 5 x 5 power. Skin: No rashes, lesions or ulcers Psychiatry: Judgement and insight appear impaired. Mood & affect flattened.     Data Reviewed: I have personally reviewed following labs and imaging studies  CBC: Recent Labs  Lab 07/24/22 0419 07/25/22 0636 07/26/22 0623 07/29/22 0853  WBC 12.2* 14.8* 13.9* 11.0*  NEUTROABS  --   --   --  4.9  HGB 14.8 13.5 12.5 13.2  HCT 46.8* 42.0 39.6 40.9  MCV 85.2 84.0 85.2 84.2  PLT 313 369 395 510*   Basic Metabolic Panel: Recent Labs  Lab 07/24/22 0419 07/25/22 0636 07/26/22 0623 07/27/22 1000 07/29/22 0853 07/30/22 0442  NA 139 145 149* 145 141  --   K 3.8 3.2* 3.3* 4.1 3.7  --   CL 104 112* 117* 114* 110  --   CO2 24 25 26 22 24   --   GLUCOSE 145* 147* 171* 130* 104*  --   BUN 20 38* 34* 29* 25*  --   CREATININE 1.19* 1.36* 1.27* 1.01* 1.01*  --   CALCIUM 9.1 9.0 8.8* 9.0 8.5*  --   MG 1.9  --   --  2.8*  --  2.6*  PHOS 3.5  --   --  1.9*  --  3.4   GFR: Estimated Creatinine Clearance: 40.7 mL/min (A) (by C-G formula based on SCr of 1.01 mg/dL (H)). Liver Function Tests: Recent Labs  Lab 07/26/22 0623  AST 17  ALT 20  ALKPHOS 46  BILITOT 0.3  PROT 6.5  ALBUMIN 3.0*   No results for input(s): "LIPASE", "AMYLASE" in the last 168 hours. No results for input(s):  "AMMONIA" in the last 168 hours. Coagulation Profile: No results for input(s): "INR", "PROTIME" in the last 168 hours. Cardiac Enzymes: No results for input(s): "CKTOTAL", "CKMB", "CKMBINDEX", "TROPONINI" in the last 168 hours. BNP (last 3 results) No results for input(s): "PROBNP" in the last 8760 hours. HbA1C: No results for input(s): "HGBA1C" in the last 72 hours. CBG: Recent Labs  Lab 07/23/22 1215 07/23/22 1947 07/23/22 2319 07/24/22 0355 07/24/22 0751  GLUCAP 149* 157* 136* 133* 137*   Lipid Profile: No results for input(s): "CHOL", "HDL", "LDLCALC", "TRIG", "CHOLHDL", "LDLDIRECT" in the last 72 hours. Thyroid Function Tests: No results for input(s): "TSH", "T4TOTAL", "FREET4", "T3FREE", "THYROIDAB" in  the last 72 hours. Anemia Panel: No results for input(s): "VITAMINB12", "FOLATE", "FERRITIN", "TIBC", "IRON", "RETICCTPCT" in the last 72 hours. Sepsis Labs: No results for input(s): "PROCALCITON", "LATICACIDVEN" in the last 168 hours.   Recent Results (from the past 240 hour(s))  SARS Coronavirus 2 by RT PCR (hospital order, performed in [facility] hospital lab) *cepheid single result test* Anterior Nasal Swab     Status: Abnormal   Collection Time: 07/22/22 10:07 AM   Specimen: Anterior Nasal Swab  Result Value Ref Range Status   SARS Coronavirus 2 by RT PCR POSITIVE (A) NEGATIVE Final    Comment: (NOTE) SARS-CoV-2 target nucleic acids are DETECTED  SARS-CoV-2 RNA is generally detectable in upper respiratory specimens  during the acute phase of infection.  Positive results are indicative  of the presence of the identified virus, but do not rule out bacterial infection or co-infection with other pathogens not detected by the test.  Clinical correlation with patient history and  other diagnostic information is necessary to determine patient infection status.  The expected result is negative.  Fact Sheet for Patients:    https://www.patel.info/   Fact Sheet for Healthcare Providers:   https://hall.com/    This test is not yet approved or cleared by the [place] FDA and  has been authorized for detection and/or diagnosis of SARS-CoV-2 by FDA under an Emergency Use Authorization (EUA).  This EUA will remain in effect (meaning this test can be used) for the duration of  the COVID-19 declaration under Section 564(b)(1)  of the Act, 21 U.S.C. section 360-bbb-3(b)(1), unless the authorization is terminated or revoked sooner.   Performed at [facility], [address], [place], [place] [address]   Urine Culture     Status: Abnormal   Collection Time: 07/22/22 12:36 PM   Specimen: Urine, Clean Catch  Result Value Ref Range Status   Specimen Description   Final    URINE, CLEAN CATCH Performed at [facility], [address] [place], [place] [address]    Special Requests   Final    NONE Performed at [facility], [address] [place], [place] [address]    Culture >=100,000 COLONIES/mL ENTEROCOCCUS FAECALIS (A)  Final   Report Status 07/25/2022 FINAL  Final   Organism ID, Bacteria ENTEROCOCCUS FAECALIS (A)  Final      Susceptibility   Enterococcus faecalis - MIC*    AMPICILLIN <=2 SENSITIVE Sensitive     NITROFURANTOIN <=16 SENSITIVE Sensitive     VANCOMYCIN 1 SENSITIVE Sensitive     * >=100,000 COLONIES/mL ENTEROCOCCUS FAECALIS  MRSA Next Gen by PCR, Nasal     Status: Abnormal   Collection Time: 07/23/22 12:25 PM   Specimen: Nasal Mucosa; Nasal Swab  Result Value Ref Range Status   MRSA by PCR Next Gen DETECTED (A) NOT DETECTED Final    Comment: RESULT CALLED TO, READ BACK BY AND VERIFIED WITH: CATHRINE CLAYTON @1353  07/23/22 MJU (NOTE) The GeneXpert MRSA Assay (FDA approved for NASAL specimens only), is one component of a comprehensive MRSA colonization surveillance program. It is not intended to diagnose MRSA  infection nor to guide or monitor treatment for MRSA infections. Test performance is not FDA approved in patients less than 3 years old. Performed at [facility], [place], [place] [address]   C Difficile Quick Screen w PCR reflex     Status: Abnormal   Collection Time: 07/24/22  1:44 AM   Specimen: STOOL  Result Value  Ref Range Status   C Diff antigen POSITIVE (A) NEGATIVE Final   C Diff toxin NEGATIVE NEGATIVE Final   C Diff interpretation Results are indeterminate. See PCR results.  Final    Comment: Performed at [facility], [address] [place], [place] [address]  C. Diff by PCR, Reflexed     Status: Abnormal   Collection Time: 07/24/22  1:44 AM  Result Value Ref Range Status   Toxigenic C. Difficile by PCR POSITIVE (A) NEGATIVE Final    Comment: Positive for toxigenic C. difficile with little to no toxin production. Only treat if clinical presentation suggests symptomatic illness. Performed at [facility], [address] [place], [place] [address]          Radiology Studies: No results found.      Scheduled Meds:  aspirin  81 mg Oral Daily   atorvastatin  40 mg Oral QPM   carvedilol  3.125 mg Oral BID WC   enoxaparin (LOVENOX) injection  40 mg Subcutaneous Q24H   feeding supplement  237 mL Oral TID BM   fidaxomicin  200 mg Oral BID   Ipratropium-Albuterol  2 puff Inhalation QID   multivitamin with minerals  1 tablet Oral Daily   mouth rinse  15 mL Mouth Rinse 4 times per day   predniSONE  20 mg Oral Q breakfast   Continuous Infusions:  sodium chloride Stopped (07/25/22 1818)     LOS: 7 days    Sidney Ace, MD [facility]   If 7PM-7AM, please contact night-coverage  07/30/2022, 11:02 AM 1

## 2022-07-30 NOTE — Plan of Care (Signed)

## 2022-07-31 ENCOUNTER — Inpatient Hospital Stay: Payer: Medicare PPO

## 2022-07-31 DIAGNOSIS — J9601 Acute respiratory failure with hypoxia: Secondary | ICD-10-CM | POA: Diagnosis not present

## 2022-07-31 LAB — CBC WITH DIFFERENTIAL/PLATELET
Abs Immature Granulocytes: 0.85 10*3/uL — ABNORMAL HIGH (ref 0.00–0.07)
Basophils Absolute: 0.1 10*3/uL (ref 0.0–0.1)
Basophils Relative: 0 %
Eosinophils Absolute: 0 10*3/uL (ref 0.0–0.5)
Eosinophils Relative: 0 %
HCT: 39.9 % (ref 36.0–46.0)
Hemoglobin: 13.1 g/dL (ref 12.0–15.0)
Immature Granulocytes: 7 %
Lymphocytes Relative: 13 %
Lymphs Abs: 1.7 10*3/uL (ref 0.7–4.0)
MCH: 27.6 pg (ref 26.0–34.0)
MCHC: 32.8 g/dL (ref 30.0–36.0)
MCV: 84 fL (ref 80.0–100.0)
Monocytes Absolute: 0.9 10*3/uL (ref 0.1–1.0)
Monocytes Relative: 7 %
Neutro Abs: 9.4 10*3/uL — ABNORMAL HIGH (ref 1.7–7.7)
Neutrophils Relative %: 73 %
Platelets: 462 10*3/uL — ABNORMAL HIGH (ref 150–400)
RBC: 4.75 MIL/uL (ref 3.87–5.11)
RDW: 15.1 % (ref 11.5–15.5)
Smear Review: NORMAL
WBC: 12.9 10*3/uL — ABNORMAL HIGH (ref 4.0–10.5)
nRBC: 0 % (ref 0.0–0.2)

## 2022-07-31 LAB — BASIC METABOLIC PANEL
Anion gap: 6 (ref 5–15)
BUN: 28 mg/dL — ABNORMAL HIGH (ref 8–23)
CO2: 26 mmol/L (ref 22–32)
Calcium: 8.9 mg/dL (ref 8.9–10.3)
Chloride: 104 mmol/L (ref 98–111)
Creatinine, Ser: 1.26 mg/dL — ABNORMAL HIGH (ref 0.44–1.00)
GFR, Estimated: 45 mL/min — ABNORMAL LOW (ref >60)
Glucose, Bld: 154 mg/dL — ABNORMAL HIGH (ref 70–99)
Potassium: 5 mmol/L (ref 3.5–5.1)
Sodium: 136 mmol/L (ref 135–145)

## 2022-07-31 LAB — BRAIN NATRIURETIC PEPTIDE: B Natriuretic Peptide: 114.6 pg/mL — ABNORMAL HIGH (ref 0.0–100.0)

## 2022-07-31 NOTE — Progress Notes (Signed)
PROGRESS NOTE    MEI RUTHENBERG  [id number] DOB: 08-07-1946 DOA: 07/22/2022 PCP: Dion Body, MD    Brief Narrative:  76 year old female with chronic kidney disease, dementia, hypertension, CAD.  A few months ago she was admitted with sepsis secondary to E. coli.  She was brought in with confusion.  She was found to have COVID-19 infection and a urinary tract infection.  She was started on antibiotics.  She did have an aspiration event on 07/23/2022 and was transferred to the ICU because she was gurgling on secretions.  Patient was kept n.p.o. that day.  She was placed on 4 L of oxygen.  She did better the next day and was tolerating diet and transferred back to the floor.  She was also diagnosed with C. difficile colitis, present on admission and started on Dificid.  Enterococcus growing out of the urine culture.  Patient switched from Unasyn to Augmentin.  Patient completed 3 days of remdesivir for COVID-19 infection.  Currently off oxygen.  Physical therapy recommending rehab.  Will remain in isolation until able to go out to rehab.   Assessment & Plan:   Principal Problem:   Acute respiratory failure with hypoxia (HCC) Active Problems:   COVID-19   C. difficile colitis   Acute metabolic encephalopathy   Enterococcus UTI   Essential hypertension   Chronic kidney disease, stage 3b (HCC)   Dementia without behavioral disturbance (HCC)   Weakness   Hyperlipidemia   Altered mental status   Acute decompensated heart failure (HCC)   Chronic diastolic CHF (congestive heart failure) (HCC)   Hypokalemia   Hypernatremia  * Acute respiratory failure with hypoxia (HCC) Pulse ox 77% on room air yesterday after likely aspiration episode.  Was on 4 L of oxygen with the aspiration episode.  Currently off oxygen at this point.  Completed course of Augmentin.  No further antibiotics at this time.  Repeat chest x-ray 9/24 appears clear, pending official read   C. difficile colitis Present  on admission.  Continue Dificid at least 7 days after antibiotics stopped.  End date 9/26   COVID-19 COVID-19 infection.  Completed 3 days of remdesivir.  Continue prednisone for now.  Dose tapered to 20 mg daily.  Continue to taper aggressively.  Acute metabolic encephalopathy Mental status much improved.  Appears to be back to baseline   Enterococcus UTI Unasyn switched over to Augmentin.  Completed course of antibioticsEssential hypertension Low-dose Coreg   Hypernatremia improved Sodium went up to 149.  Received fluid bolus of D5 water.  Sodium back in reference range.   Hypokalemia Monitor and replace as necessary   Chronic diastolic CHF (congestive heart failure) (HCC) Last EF 65 to 70%.  Patient received a dose of Lasix on admission and a dose of Lasix 2 days ago.  Hold off on further Lasix dosing.  Currently on Coreg.  This is likely more secondary to respiratory issues rather than heart issues.  Check BMP.  Check chest x-ray.  Consider dose of Lasix   Hyperlipidemia Restarted Lipitor   Weakness Rehab recommended by PT and OT   Dementia without behavioral disturbance (HCC) Hold Aricept   Chronic kidney disease, stage 3b (HCC) Cr stable   DVT prophylaxis: SQ Lovenox Code Status: Full Family Communication: Daughter Sharren Bridge [phone] on 9/20, 9/21, 9/22 Disposition Plan: Status is: Inpatient Remains inpatient appropriate because: Unsafe discharge plan.  Medically stable.  Awaiting.  For skilled nursing facility placement.  Discharge 9/26.  Family has chosen [facility].  Level of care: Med-Surg  Consultants:  None  Procedures:  None  Antimicrobials:   Subjective: Seen and examined.  Flattened affect.  Resting comfortably in bed.  Answers all questions appropriately.  No visible distress  Objective: Vitals:   07/31/22 0534 07/31/22 0606 07/31/22 0725 07/31/22 1510  BP: 139/62 133/60 (!) 126/59 128/76  Pulse: 61 64 66 82  Resp: 16 16 14  14   Temp: 98.7 F (37.1 C) 98 F (36.7 C)  97.9 F (36.6 C)  TempSrc: Axillary Oral    SpO2: 100% 100% 98% 99%  Weight:      Height:        Intake/Output Summary (Last 24 hours) at 07/31/2022 1521 Last data filed at 07/31/2022 [id number] Gross per 24 hour  Intake --  Output 700 ml  Net -700 ml   Filed Weights   07/22/22 1400 07/23/22 1222 07/29/22 1652  Weight: 69.9 kg 65.2 kg 65.7 kg    Examination:  General exam: NAD Respiratory system: Bibasilar crackles.  Normal work of breathing.  Room air Cardiovascular system: S1-S2, RRR, no murmurs, no pedal edema Gastrointestinal system: Soft, NT/ND, hyperactive bowel sounds Central nervous system: Alert and oriented x2. No focal neurological deficits. Extremities: Symmetric 5 x 5 power. Skin: No rashes, lesions or ulcers Psychiatry: Judgement and insight appear impaired. Mood & affect flattened.     Data Reviewed: I have personally reviewed following labs and imaging studies  CBC: Recent Labs  Lab 07/25/22 0636 07/26/22 0623 07/29/22 0853 07/30/22 1412  WBC 14.8* 13.9* 11.0* 12.4*  NEUTROABS  --   --  4.9 8.7*  HGB 13.5 12.5 13.2 13.7  HCT 42.0 39.6 40.9 42.7  MCV 84.0 85.2 84.2 84.6  PLT 369 395 401* [id number]*   Basic Metabolic Panel: Recent Labs  Lab 07/25/22 0636 07/26/22 0623 07/27/22 1000 07/29/22 0853 07/30/22 0442 07/30/22 1412  NA 145 149* 145 141  --  138  K 3.2* 3.3* 4.1 3.7  --  4.2  CL 112* 117* 114* 110  --  107  CO2 25 26 22 24   --  25  GLUCOSE 147* 171* 130* 104*  --  170*  BUN 38* 34* 29* 25*  --  26*  CREATININE 1.36* 1.27* 1.01* 1.01*  --  0.99  CALCIUM 9.0 8.8* 9.0 8.5*  --  9.0  MG  --   --  2.8*  --  2.6*  --   PHOS  --   --  1.9*  --  3.4  --    GFR: Estimated Creatinine Clearance: 41.5 mL/min (by C-G formula based on SCr of 0.99 mg/dL). Liver Function Tests: Recent Labs  Lab 07/26/22 0623  AST 17  ALT 20  ALKPHOS 46  BILITOT 0.3  PROT 6.5  ALBUMIN 3.0*   No results for input(s):  "LIPASE", "AMYLASE" in the last 168 hours. No results for input(s): "AMMONIA" in the last 168 hours. Coagulation Profile: No results for input(s): "INR", "PROTIME" in the last 168 hours. Cardiac Enzymes: No results for input(s): "CKTOTAL", "CKMB", "CKMBINDEX", "TROPONINI" in the last 168 hours. BNP (last 3 results) No results for input(s): "PROBNP" in the last 8760 hours. HbA1C: No results for input(s): "HGBA1C" in the last 72 hours. CBG: No results for input(s): "GLUCAP" in the last 168 hours.  Lipid Profile: No results for input(s): "CHOL", "HDL", "LDLCALC", "TRIG", "CHOLHDL", "LDLDIRECT" in the last 72 hours. Thyroid Function Tests: No results for input(s): "TSH", "T4TOTAL", "FREET4", "T3FREE", "THYROIDAB" in the last 72 hours. Anemia  Panel: No results for input(s): "VITAMINB12", "FOLATE", "FERRITIN", "TIBC", "IRON", "RETICCTPCT" in the last 72 hours. Sepsis Labs: No results for input(s): "PROCALCITON", "LATICACIDVEN" in the last 168 hours.   Recent Results (from the past 240 hour(s))  SARS Coronavirus 2 by RT PCR (hospital order, performed in [facility] hospital lab) *cepheid single result test* Anterior Nasal Swab     Status: Abnormal   Collection Time: 07/22/22 10:07 AM   Specimen: Anterior Nasal Swab  Result Value Ref Range Status   SARS Coronavirus 2 by RT PCR POSITIVE (A) NEGATIVE Final    Comment: (NOTE) SARS-CoV-2 target nucleic acids are DETECTED  SARS-CoV-2 RNA is generally detectable in upper respiratory specimens  during the acute phase of infection.  Positive results are indicative  of the presence of the identified virus, but do not rule out bacterial infection or co-infection with other pathogens not detected by the test.  Clinical correlation with patient history and  other diagnostic information is necessary to determine patient infection status.  The expected result is negative.  Fact Sheet for Patients:    https://www.patel.info/   Fact Sheet for Healthcare Providers:   https://hall.com/    This test is not yet approved or cleared by the [place] FDA and  has been authorized for detection and/or diagnosis of SARS-CoV-2 by FDA under an Emergency Use Authorization (EUA).  This EUA will remain in effect (meaning this test can be used) for the duration of  the COVID-19 declaration under Section 564(b)(1)  of the Act, 21 U.S.C. section 360-bbb-3(b)(1), unless the authorization is terminated or revoked sooner.   Performed at [facility], [address] [place], [place] [address]   Urine Culture     Status: Abnormal   Collection Time: 07/22/22 12:36 PM   Specimen: Urine, Clean Catch  Result Value Ref Range Status   Specimen Description   Final    URINE, CLEAN CATCH Performed at [facility], [address] [place], [place] [address]    Special Requests   Final    NONE Performed at [facility], [address] [place], [place] [address]    Culture >=100,000 COLONIES/mL ENTEROCOCCUS FAECALIS (A)  Final   Report Status 07/25/2022 FINAL  Final   Organism ID, Bacteria ENTEROCOCCUS FAECALIS (A)  Final      Susceptibility   Enterococcus faecalis - MIC*    AMPICILLIN <=2 SENSITIVE Sensitive     NITROFURANTOIN <=16 SENSITIVE Sensitive     VANCOMYCIN 1 SENSITIVE Sensitive     * >=100,000 COLONIES/mL ENTEROCOCCUS FAECALIS  MRSA Next Gen by PCR, Nasal     Status: Abnormal   Collection Time: 07/23/22 12:25 PM   Specimen: Nasal Mucosa; Nasal Swab  Result Value Ref Range Status   MRSA by PCR Next Gen DETECTED (A) NOT DETECTED Final    Comment: RESULT CALLED TO, READ BACK BY AND VERIFIED WITH: CATHRINE CLAYTON @1353  07/23/22 MJU (NOTE) The GeneXpert MRSA Assay (FDA approved for NASAL specimens only), is one component of a comprehensive MRSA colonization surveillance program. It is not intended to diagnose MRSA  infection nor to guide or monitor treatment for MRSA infections. Test performance is not FDA approved in patients less than 56 years old. Performed at [facility], [place],  [address]   C Difficile Quick Screen w PCR reflex     Status: Abnormal   Collection Time: 07/24/22  1:44 AM   Specimen: STOOL  Result Value Ref Range Status  C Diff antigen POSITIVE (A) NEGATIVE Final   C Diff toxin NEGATIVE NEGATIVE Final   C Diff interpretation Results are indeterminate. See PCR results.  Final    Comment: Performed at [facility], [address] [place], [place] [address]  C. Diff by PCR, Reflexed     Status: Abnormal   Collection Time: 07/24/22  1:44 AM  Result Value Ref Range Status   Toxigenic C. Difficile by PCR POSITIVE (A) NEGATIVE Final    Comment: Positive for toxigenic C. difficile with little to no toxin production. Only treat if clinical presentation suggests symptomatic illness. Performed at [facility], [address] [place], [place] [address]          Radiology Studies: No results found.      Scheduled Meds:  aspirin  81 mg Oral Daily   atorvastatin  40 mg Oral QPM   carvedilol  3.125 mg Oral BID WC   enoxaparin (LOVENOX) injection  40 mg Subcutaneous Q24H   feeding supplement  237 mL Oral TID BM   fidaxomicin  200 mg Oral BID   Ipratropium-Albuterol  2 puff Inhalation QID   multivitamin with minerals  1 tablet Oral Daily   mouth rinse  15 mL Mouth Rinse 4 times per day   predniSONE  20 mg Oral Q breakfast   traZODone  50 mg Oral QHS   Continuous Infusions:  sodium chloride Stopped (07/25/22 1818)     LOS: 8 days    Sidney Ace, MD [facility]   If 7PM-7AM, please contact night-coverage  07/31/2022, 3:21 PM 1

## 2022-07-31 NOTE — Progress Notes (Addendum)
Speech Language Pathology Treatment: Dysphagia  Patient Details Name: Yolanda Moore MRN: [id number] DOB: 06-Apr-1946 Today's Date: 07/31/2022 Time: [id number] SLP Time Calculation (min) (ACUTE ONLY): 13 min  Assessment / Plan / Recommendation Clinical Impression  Pt seen for ongoing diet management. Skilled observation was provided on pt consuming soft solids, puree and thin liquids. Pt presents with need for cuing throughout consuming d/t decreased cognitive ability related to dx of dementia. Pt consumed without any overt s/s of aspiration or dysphagia. As such, current diet of dysphagia 2 appears appropriate.   Per pt's attending, pt's daughter interested in having pt consume a puree only diet. I spoke with pt's daughter, Yolanda Moore, over the phone. She voices frustration over her mother's care and demands repeat x-ray and labs "before I will ever think about signing her out tomorrow." In addition, she states that pt "cannot eat anything but puree because she can't swallow it." She further provides that pt had stopped eating and drinking ~ 1 week prior to admission. From daughter's description, it would appear pt's symptoms were related to her dx of dementia. Pt's daughter is adamant that pt be downgraded to puree diet. I have placed such order and provided the above mentioned demands of repeat chest x-ray and labs to pt's attending via secure chat.   ST will sign off at this time. Should pt's chest x-ray reveal any concern for aspiration, please re-consult ST.    HPI HPI: Per H&P: "Yolanda Moore is a 76 y.o. female with medical history significant of hypertension, hyperlipidemia, Dementia, CKD stage IIIb, coronary artery disease who was brought from independent living facility with complaint of confusion.  Her daughter went to see her this morning and she was found to be very weak, confused more than her baseline.  She was not responding that much.  She was then brought to the Emergency Department She has  history of dementia.  As per the daughter, she is intermittently confused but not confused at this level.  She lives at independent facility by herself.  She walks without any problem." Pt with aspiration event on 07/23/22 (suspect aspiration of secretions). Following aspiration, she developed hypoxia requiring oxygen supplementation and transfer to the ICU for higher level of care. Pt now transferred back to floor status. CXR 07/23/22: "Mild cardiomegaly with mild, diffuse bilateral interstitial pulmonary opacity, suggesting edema or infection. No focal airspace opacity." Pt is on nectar thick liquids and Dys 1 (puree). Pt resting on 2L nasal canula during evaluation. Pt last seen by SLP service on 05/18/22, with recommendations for regular solids (chopped meat) and thin liquids.      SLP Plan  All goals met      Recommendations for follow up therapy are one component of a multi-disciplinary discharge planning process, led by the attending physician.  Recommendations may be updated based on patient status, additional functional criteria and insurance authorization.    Recommendations  Diet recommendations: Dysphagia 1 (puree);Thin liquid Liquids provided via: Cup;Straw Medication Administration: Crushed with puree Supervision: Full supervision/cueing for compensatory strategies Compensations: Minimize environmental distractions;Slow rate;Small sips/bites Postural Changes and/or Swallow Maneuvers: Seated upright 90 degrees;Upright 30-60 min after meal;Out of bed for meals                Oral Care Recommendations: Oral care BID Follow Up Recommendations: Follow physician's recommendations for discharge plan and follow up therapies Assistance recommended at discharge: Frequent or constant Supervision/Assistance SLP Visit Diagnosis: Dysphagia, oropharyngeal phase (R13.12) Plan: All goals met  Yolanda Moore, M.S., CCC-SLP, Mining engineer Certified Brain  [facility] [place]  [place] Office [phone] Ascom [phone] Fax [phone]

## 2022-08-01 LAB — CBC WITH DIFFERENTIAL/PLATELET
Abs Immature Granulocytes: 0.95 10*3/uL — ABNORMAL HIGH (ref 0.00–0.07)
Basophils Absolute: 0.1 10*3/uL (ref 0.0–0.1)
Basophils Relative: 1 %
Eosinophils Absolute: 0.1 10*3/uL (ref 0.0–0.5)
Eosinophils Relative: 1 %
HCT: 40.5 % (ref 36.0–46.0)
Hemoglobin: 13.3 g/dL (ref 12.0–15.0)
Immature Granulocytes: 7 %
Lymphocytes Relative: 30 %
Lymphs Abs: 4.1 10*3/uL — ABNORMAL HIGH (ref 0.7–4.0)
MCH: 27.4 pg (ref 26.0–34.0)
MCHC: 32.8 g/dL (ref 30.0–36.0)
MCV: 83.3 fL (ref 80.0–100.0)
Monocytes Absolute: 1.3 10*3/uL — ABNORMAL HIGH (ref 0.1–1.0)
Monocytes Relative: 9 %
Neutro Abs: 7.1 10*3/uL (ref 1.7–7.7)
Neutrophils Relative %: 52 %
Platelets: 419 10*3/uL — ABNORMAL HIGH (ref 150–400)
RBC: 4.86 MIL/uL (ref 3.87–5.11)
RDW: 15.2 % (ref 11.5–15.5)
Smear Review: NORMAL
WBC: 13.7 10*3/uL — ABNORMAL HIGH (ref 4.0–10.5)
nRBC: 0 % (ref 0.0–0.2)

## 2022-08-01 LAB — BASIC METABOLIC PANEL
Anion gap: 6 (ref 5–15)
BUN: 20 mg/dL (ref 8–23)
CO2: 26 mmol/L (ref 22–32)
Calcium: 9.2 mg/dL (ref 8.9–10.3)
Chloride: 106 mmol/L (ref 98–111)
Creatinine, Ser: 1.06 mg/dL — ABNORMAL HIGH (ref 0.44–1.00)
GFR, Estimated: 55 mL/min — ABNORMAL LOW (ref >60)
Glucose, Bld: 83 mg/dL (ref 70–99)
Potassium: 4.2 mmol/L (ref 3.5–5.1)
Sodium: 138 mmol/L (ref 135–145)

## 2022-08-01 NOTE — Progress Notes (Signed)
Physical Therapy Treatment Patient Details Name: Yolanda Moore MRN: [id number] DOB: [birth year] Today's Date: 08/01/2022   History of Present Illness Patient is a 76 year old female with Acute respiratory failure with hypoxia, [id number], Acute metabolic encephalopathy, UTI, C. difficile colitis. History of hypertension, hyperlipidemia, dementia, CKD stage IIIb, coronary artery disease  and from independent living facility.    PT Comments    Patient was agreeable to PT session. She continues to require physical assistance with bed mobility and transfers. Sit to stand performed x 5 bouts. Poor standing balance this session limits safe progression of ambulation. Recommend to continue PT to maximize independence and decrease caregiver burden. Updated the daughter today at the bedside on patient's progress with PT.    Recommendations for follow up therapy are one component of a multi-disciplinary discharge planning process, led by the attending physician.  Recommendations may be updated based on patient status, additional functional criteria and insurance authorization.  Follow Up Recommendations  Skilled nursing-short term rehab (<3 hours/day) Can patient physically be transported by private vehicle: Yes   Assistance Recommended at Discharge Frequent or constant Supervision/Assistance  Patient can return home with the following A lot of help with walking and/or transfers;A lot of help with bathing/dressing/bathroom;Help with stairs or ramp for entrance;Assist for transportation;Assistance with cooking/housework;Direct supervision/assist for medications management   Equipment Recommendations  None recommended by PT    Recommendations for Other Services       Precautions / Restrictions Precautions Precautions: Fall Restrictions Weight Bearing Restrictions: No     Mobility  Bed Mobility Overal bed mobility: Needs Assistance Bed Mobility: Supine to Sit, Sit to Supine     Supine to sit:  Mod assist, HOB elevated Sit to supine: Mod assist   General bed mobility comments: verbal cues for technique and sequencing.    Transfers Overall transfer level: Needs assistance Equipment used: None Transfers: Sit to/from Stand Sit to Stand: Mod assist, Min assist           General transfer comment: 5 bouts of standing performed from bed with cues for technique. patient required from [place] A to Mod A for transfers, lifting and lowering assistance provided as needed.    Ambulation/Gait               General Gait Details: standing activity tolerance limited for progression of ambulation during this session   Stairs             Wheelchair Mobility    Modified Rankin (Stroke Patients Only)       Balance Overall balance assessment: Needs assistance Sitting-balance support: Feet supported Sitting balance-Leahy Scale: Good     Standing balance support: No upper extremity supported Standing balance-Leahy Scale: Poor Standing balance comment: external support required to maintain standing balance this session, Min A needed                            Cognition Arousal/Alertness: Awake/alert Behavior During Therapy: WFL for tasks assessed/performed Overall Cognitive Status: History of cognitive impairments - at baseline                                 General Comments: cues for sequencing and task initiation. patient is cooperative throughout session        Exercises      General Comments General comments (skin integrity, edema, etc.): patient's daughter was present at end  of the therapy session. updated her with therapy progress.      Pertinent Vitals/Pain Pain Assessment Pain Assessment: No/denies pain    Home Living                          Prior Function            PT Goals (current goals can now be found in the care plan section) Acute Rehab PT Goals Patient Stated Goal: patient unable to participate  with goal setting PT Goal Formulation: Patient unable to participate in goal setting Time For Goal Achievement: 08/08/22 Potential to Achieve Goals: Fair Progress towards PT goals: Progressing toward goals    Frequency    Min 2X/week      PT Plan Current plan remains appropriate    Co-evaluation              AM-PAC PT "6 Clicks" Mobility   Outcome Measure  Help needed turning from your back to your side while in a flat bed without using bedrails?: A Little Help needed moving from lying on your back to sitting on the side of a flat bed without using bedrails?: A Little Help needed moving to and from a bed to a chair (including a wheelchair)?: A Lot Help needed standing up from a chair using your arms (e.g., wheelchair or bedside chair)?: A Lot Help needed to walk in hospital room?: A Lot Help needed climbing 3-5 steps with a railing? : Total 6 Click Score: 13    End of Session   Activity Tolerance: Patient tolerated treatment well Patient left: in bed;with call bell/phone within reach;with bed alarm set;with family/visitor present Nurse Communication: Mobility status PT Visit Diagnosis: Unsteadiness on feet (R26.81);Muscle weakness (generalized) (M62.81)     Time: [id number] PT Time Calculation (min) (ACUTE ONLY): 21 min  Charges:  $Therapeutic Activity: 8-22 mins                     Minna Merritts, PT, MPT    Percell Locus 08/01/2022, 2:42 PM

## 2022-08-01 NOTE — TOC Progression Note (Signed)
Transition of Care [facility]) - Progression Note    Patient Details  Name: Yolanda Moore MRN: [id number] Date of Birth: 08-19-1946  Transition of Care [facility]) CM/SW Contact  Beverly Sessions, RN Phone Number: 08/01/2022, 4:23 PM  Clinical Narrative:      Insurance auth started for anticipated [employer].  Lavella Lemons at [facility] notified, and daughter updated Pending auth number # [id number] Updated therapy note sent in addition to initial auth       Expected Discharge Plan and Services                                                 Social Determinants of Health (SDOH) Interventions    Readmission Risk Interventions     No data to display

## 2022-08-01 NOTE — Progress Notes (Signed)
Patients daughter [name] Capers visited patient and expressed concern about her mothers condition.  Labs and test reviewed as well as [facility] policy of not repeating COVID test prior to discharge.  Daughter stated that her mother wanted to be made a DNR and updated in her chart.  I called her daughter at  6pm to update her and assure her mother was stable and that the plan was to discharge tomorrow to [facility].  Wilma stated that she understood the plan.

## 2022-08-02 DIAGNOSIS — J9601 Acute respiratory failure with hypoxia: Secondary | ICD-10-CM | POA: Diagnosis not present

## 2022-08-02 LAB — CBC WITH DIFFERENTIAL/PLATELET
Abs Immature Granulocytes: 0.56 10*3/uL — ABNORMAL HIGH (ref 0.00–0.07)
Basophils Absolute: 0.1 10*3/uL (ref 0.0–0.1)
Basophils Relative: 0 %
Eosinophils Absolute: 0.1 10*3/uL (ref 0.0–0.5)
Eosinophils Relative: 0 %
HCT: 39.2 % (ref 36.0–46.0)
Hemoglobin: 12.5 g/dL (ref 12.0–15.0)
Immature Granulocytes: 4 %
Lymphocytes Relative: 21 %
Lymphs Abs: 2.9 10*3/uL (ref 0.7–4.0)
MCH: 26.8 pg (ref 26.0–34.0)
MCHC: 31.9 g/dL (ref 30.0–36.0)
MCV: 84.1 fL (ref 80.0–100.0)
Monocytes Absolute: 1.3 10*3/uL — ABNORMAL HIGH (ref 0.1–1.0)
Monocytes Relative: 9 %
Neutro Abs: 8.9 10*3/uL — ABNORMAL HIGH (ref 1.7–7.7)
Neutrophils Relative %: 66 %
Platelets: 359 10*3/uL (ref 150–400)
RBC: 4.66 MIL/uL (ref 3.87–5.11)
RDW: 15.2 % (ref 11.5–15.5)
WBC: 13.8 10*3/uL — ABNORMAL HIGH (ref 4.0–10.5)
nRBC: 0 % (ref 0.0–0.2)

## 2022-08-02 LAB — BASIC METABOLIC PANEL
Anion gap: 4 — ABNORMAL LOW (ref 5–15)
BUN: 25 mg/dL — ABNORMAL HIGH (ref 8–23)
CO2: 26 mmol/L (ref 22–32)
Calcium: 9 mg/dL (ref 8.9–10.3)
Chloride: 107 mmol/L (ref 98–111)
Creatinine, Ser: 1.05 mg/dL — ABNORMAL HIGH (ref 0.44–1.00)
GFR, Estimated: 55 mL/min — ABNORMAL LOW (ref >60)
Glucose, Bld: 98 mg/dL (ref 70–99)
Potassium: 4.6 mmol/L (ref 3.5–5.1)
Sodium: 137 mmol/L (ref 135–145)

## 2022-08-02 LAB — MAGNESIUM: Magnesium: 2.4 mg/dL (ref 1.7–2.4)

## 2022-08-02 MED ORDER — IPRATROPIUM-ALBUTEROL 20-100 MCG/ACT IN AERS: 2.0000 | INHALATION_SPRAY | Freq: Four times a day (QID) | RESPIRATORY_TRACT | Status: DC | PRN

## 2022-08-02 MED ORDER — TRAZODONE HCL 50 MG PO TABS: 50.0000 mg | ORAL_TABLET | Freq: Every day | ORAL | Status: DC

## 2022-08-02 MED ORDER — PREDNISONE 10 MG PO TABS: ORAL_TABLET | ORAL | 0 refills | Status: AC

## 2022-08-02 MED ORDER — PREDNISONE 10 MG PO TABS: 10.0000 mg | ORAL_TABLET | Freq: Every day | ORAL | Status: DC

## 2022-08-02 MED ORDER — CARVEDILOL 3.125 MG PO TABS: 3.1250 mg | ORAL_TABLET | Freq: Two times a day (BID) | ORAL | Status: DC

## 2022-08-02 NOTE — Discharge Summary (Signed)
Physician Discharge Summary  JUREA CECCARELLI [id number] DOB: [birth year] DOA: 07/22/2022  PCP: Dion Body, MD  Admit date: 07/22/2022 Discharge date: 08/02/2022  Admitted From: Home Disposition:  SNF  Recommendations for Outpatient Follow-up:  Follow up with PCP in 1-2 weeks   Home Health:No Equipment/Devices:None   Discharge Condition:Stable CODE STATUS:DNR Diet recommendation: Puree  Brief/Interim Summary: 76 year old female with chronic kidney disease, dementia, hypertension, CAD.  A few months ago she was admitted with sepsis secondary to E. coli.  She was brought in with confusion.  She was found to have COVID-19 infection and a urinary tract infection.  She was started on antibiotics.  She did have an aspiration event on 07/23/2022 and was transferred to the ICU because she was gurgling on secretions.  Patient was kept n.p.o. that day.  She was placed on 4 L of oxygen.  She did better the next day and was tolerating diet and transferred back to the floor.  She was also diagnosed with C. difficile colitis, present on admission and started on Dificid.  Enterococcus growing out of the urine culture.  Patient switched from Unasyn to Augmentin.  Patient completed 3 days of remdesivir for COVID-19 infection.  Currently off oxygen.  Physical therapy recommending rehab.  Will remain in isolation until able to go out to rehab.  9/26: Patient stable from a medical standpoint.  She will complete her course of Dificid, last dose will be administered at 1400 today prior to the patient leaving for skilled nursing facility.  At time of discharge will recommend dose adjustment of her antihypertensive regimen.  Discontinue losartan, decrease dose of Coreg to 3.125 mg twice daily.  Appropriate for discharge to skilled nursing facility.  Post discharge plan of care explained at length with daughter via phone.  Daughter concerned about persistent and recurrent infections.  Explained that acute  infections have resolved but impossible to predict whether her infections will occur again in the future.  At time of discharge patient is afebrile, hemodynamically stable.  Has completed course of treatment for COVID-pneumonia, C. Difficile, Enterococcus UTI.  No antibiotics or antimicrobials indicated on discharge.    Discharge Diagnoses:  Principal Problem:   Acute respiratory failure with hypoxia ([place]) Active Problems:   COVID-19   C. difficile colitis   Acute metabolic encephalopathy   Enterococcus UTI   Essential hypertension   Chronic kidney disease, stage 3b (HCC)   Dementia without behavioral disturbance (HCC)   Weakness   Hyperlipidemia   Altered mental status   Acute decompensated heart failure (HCC)   Chronic diastolic CHF (congestive heart failure) (HCC)   Hypokalemia   Hypernatremia * Acute respiratory failure with hypoxia (HCC) Pulse ox 77% on room air yesterday after likely aspiration episode.  Was on 4 L of oxygen with the aspiration episode.  Currently off oxygen at this point.  Completed course of Augmentin.  No further antibiotics at this time.  Repeat chest x-ray 9/24.  Clear.  No acute disease noted.  Patient on room air at time of discharge   C. difficile colitis Present on admission.  Continue Dificid at least 7 days after antibiotics stopped.   End date today 9/26.  Patient will receive last dose of Dificid while admitted   COVID-19 COVID-19 infection.  Completed 3 days of remdesivir.  Continue prednisone for now.  Dose tapered to 10 mg daily.  Continue to progressively taper.  Will prescribe tapering dose at time of DC   Acute metabolic encephalopathy Mental status much improved.  Appears to be back to baseline   Enterococcus UTI Unasyn switched over to Augmentin.  Completed course of antibiotics.  Monitor vitals and fever curve.  No further antibiotics   Essential hypertension Low-dose Coreg.  Prior to arrival patient was on 6.  2 5 mg Coreg twice  daily and losartan 100 mg daily.  Blood pressure has not tolerated escalation of the regimen.  At time of discharge will recommend just Coreg 3.125 mg twice daily.    Hypernatremia improved Sodium went up to 149.  Received fluid bolus of D5 water.  Sodium back in reference range.   Hypokalemia Monitor and replace as necessary   Chronic diastolic CHF (congestive heart failure) (HCC) Last EF 65 to 70%.  Patient received a dose of Lasix on admission and a dose of Lasix 2 days ago.  Hold off on further Lasix dosing.  Currently on Coreg.  This is likely more secondary to respiratory issues rather than heart issues.  BNP not indicative of decompensated heart failure.  Chest x-ray clear.  No further Lasix at this time.   Discharge Instructions  Discharge Instructions     Diet - low sodium heart healthy   Complete by: As directed    Puree diet   Increase activity slowly   Complete by: As directed       Allergies as of 08/02/2022   No Known Allergies      Medication List     STOP taking these medications    losartan 100 MG tablet Commonly known as: COZAAR       TAKE these medications    aspirin EC 81 MG tablet Take 81 mg by mouth daily.   atorvastatin 40 MG tablet Commonly known as: LIPITOR Take 1 tablet (40 mg total) by mouth daily.   carvedilol 3.125 MG tablet Commonly known as: COREG Take 1 tablet (3.125 mg total) by mouth 2 (two) times daily with a meal. What changed:  medication strength how much to take   donepezil 10 MG tablet Commonly known as: ARICEPT Take 10 mg by mouth at bedtime.   Ipratropium-Albuterol 20-100 MCG/ACT Aers respimat Commonly known as: COMBIVENT Inhale 2 puffs into the lungs every 6 (six) hours as needed for wheezing.   nitroGLYCERIN 0.4 MG SL tablet Commonly known as: NITROSTAT Place 0.4 mg under the tongue every 5 (five) minutes as needed for chest pain.   predniSONE 10 MG tablet Commonly known as: DELTASONE Take 1 tablet (10 mg  total) by mouth daily with breakfast for 3 days, THEN 0.5 tablets (5 mg total) daily with breakfast for 3 days. Start taking on: August 03, 2022   traZODone 50 MG tablet Commonly known as: DESYREL Take 1 tablet (50 mg total) by mouth at bedtime. What changed:  when to take this reasons to take this        No Known Allergies  Consultations: PCCM   Procedures/Studies: DG Chest Port 1 View  Result Date: 07/31/2022 CLINICAL DATA:  COVID-19 EXAM: PORTABLE CHEST 1 VIEW COMPARISON:  07/23/2022 FINDINGS: Stable mild cardiomegaly. Aortic atherosclerosis. No focal airspace consolidation, pleural effusion, or pneumothorax. IMPRESSION: No active disease. Electronically Signed   By: Davina Poke D.O.   On: 07/31/2022 16:36   DG Chest Port 1 View  Result Date: 07/23/2022 CLINICAL DATA:  Shortness of breath, COVID positive EXAM: PORTABLE CHEST 1 VIEW COMPARISON:  07/23/2022, 11:43 a.m. FINDINGS: Mild cardiomegaly. Mild, diffuse bilateral interstitial pulmonary opacity. The visualized skeletal structures are unremarkable. IMPRESSION: Mild cardiomegaly with  mild, diffuse bilateral interstitial pulmonary opacity, suggesting edema or infection. No focal airspace opacity. Electronically Signed   By: Delanna Ahmadi M.D.   On: 07/23/2022 14:03   DG Chest Port 1 View  Result Date: 07/23/2022 CLINICAL DATA:  Confusion and weakness.  COVID positive. EXAM: PORTABLE CHEST 1 VIEW COMPARISON:  Chest x-ray from yesterday. FINDINGS: The heart size and mediastinal contours are within normal limits. Both lungs are clear. The visualized skeletal structures are unremarkable. IMPRESSION: No active disease. Electronically Signed   By: Titus Dubin M.D.   On: 07/23/2022 12:00   DG Chest Portable 1 View  Result Date: 07/22/2022 CLINICAL DATA:  weakness EXAM: PORTABLE CHEST 1 VIEW COMPARISON:  June 08, 2022 FINDINGS: The cardiomediastinal silhouette is unchanged in contour.Atherosclerotic calcifications.  Perihilar vascular fullness. No pleural effusion. No pneumothorax. No acute pleuroparenchymal abnormality. Visualized abdomen is unremarkable. IMPRESSION: Perihilar vascular fullness without overt edema. Electronically Signed   By: Valentino Saxon M.D.   On: 07/22/2022 10:38      Subjective: Seen and examined the day of discharge.  Stable no distress.  In good spirits.  Appropriate for discharge to skilled nursing facility.  Discharge Exam: Vitals:   08/02/22 0455 08/02/22 1000  BP: 125/62 (!) 132/58  Pulse: 69 78  Resp: 18 19  Temp: 98.2 F (36.8 C) 98.4 F (36.9 C)  SpO2: 98% 99%   Vitals:   08/01/22 1602 08/01/22 2046 08/02/22 0455 08/02/22 1000  BP: (!) 116/59 (!) 124/53 125/62 (!) 132/58  Pulse: 69 69 69 78  Resp: 20 18 18 19   Temp: 98.5 F (36.9 C) 98.3 F (36.8 C) 98.2 F (36.8 C) 98.4 F (36.9 C)  TempSrc: Oral Oral Oral Oral  SpO2: 100% 99% 98% 99%  Weight:      Height:        General: Pt is alert, awake, not in acute distress Cardiovascular: RRR, S1/S2 +, no rubs, no gallops Respiratory: CTA bilaterally, no wheezing, no rhonchi Abdominal: Soft, NT, ND, bowel sounds + Extremities: no edema, no cyanosis    The results of significant diagnostics from this hospitalization (including imaging, microbiology, ancillary and laboratory) are listed below for reference.     Microbiology: Recent Results (from the past 240 hour(s))  C Difficile Quick Screen w PCR reflex     Status: Abnormal   Collection Time: 07/24/22  1:44 AM   Specimen: STOOL  Result Value Ref Range Status   C Diff antigen POSITIVE (A) NEGATIVE Final   C Diff toxin NEGATIVE NEGATIVE Final   C Diff interpretation Results are indeterminate. See PCR results.  Final    Comment: Performed at [facility], [address] [place], [place] [address]  C. Diff by PCR, Reflexed     Status: Abnormal   Collection Time: 07/24/22  1:44 AM  Result Value Ref Range Status   Toxigenic C. Difficile  by PCR POSITIVE (A) NEGATIVE Final    Comment: Positive for toxigenic C. difficile with little to no toxin production. Only treat if clinical presentation suggests symptomatic illness. Performed at [facility], [address] [place], [place] [address]      Labs: BNP (last 3 results) Recent Labs    07/22/22 1138 07/31/22 1528  BNP 362.9* [id number]*   Basic Metabolic Panel: Recent Labs  Lab 07/27/22 1000 07/29/22 0853 07/30/22 0442 07/30/22 1412 07/31/22 1528 08/01/22 0708 08/02/22 0644  NA 145 141  --  138 136 138 137  K 4.1 3.7  --  4.2 5.0 4.2 4.6  CL 114* 110  --  107 104 106 107  CO2 22 24  --  25 26 26 26   GLUCOSE 130* 104*  --  170* 154* 83 98  BUN 29* 25*  --  26* 28* 20 25*  CREATININE 1.01* 1.01*  --  0.99 1.26* 1.06* 1.05*  CALCIUM 9.0 8.5*  --  9.0 8.9 9.2 9.0  MG 2.8*  --  2.6*  --   --   --  2.4  PHOS 1.9*  --  3.4  --   --   --   --    Liver Function Tests: No results for input(s): "AST", "ALT", "ALKPHOS", "BILITOT", "PROT", "ALBUMIN" in the last 168 hours. No results for input(s): "LIPASE", "AMYLASE" in the last 168 hours. No results for input(s): "AMMONIA" in the last 168 hours. CBC: Recent Labs  Lab 07/29/22 0853 07/30/22 1412 07/31/22 1528 08/01/22 0708 08/02/22 0644  WBC 11.0* 12.4* 12.9* 13.7* 13.8*  NEUTROABS 4.9 8.7* 9.4* 7.1 8.9*  HGB 13.2 13.7 13.1 13.3 12.5  HCT 40.9 42.7 39.9 40.5 39.2  MCV 84.2 84.6 84.0 83.3 84.1  PLT 401* 445* 462* 419* 359   Cardiac Enzymes: No results for input(s): "CKTOTAL", "CKMB", "CKMBINDEX", "TROPONINI" in the last 168 hours. BNP: Invalid input(s): "POCBNP" CBG: No results for input(s): "GLUCAP" in the last 168 hours. D-Dimer No results for input(s): "DDIMER" in the last 72 hours. Hgb A1c No results for input(s): "HGBA1C" in the last 72 hours. Lipid Profile No results for input(s): "CHOL", "HDL", "LDLCALC", "TRIG", "CHOLHDL", "LDLDIRECT" in the last 72 hours. Thyroid function studies No  results for input(s): "TSH", "T4TOTAL", "T3FREE", "THYROIDAB" in the last 72 hours.  Invalid input(s): "FREET3" Anemia work up No results for input(s): "VITAMINB12", "FOLATE", "FERRITIN", "TIBC", "IRON", "RETICCTPCT" in the last 72 hours. Urinalysis    Component Value Date/Time   COLORURINE YELLOW (A) 07/22/2022 1008   APPEARANCEUR HAZY (A) 07/22/2022 1008   LABSPEC 1.021 07/22/2022 1008   PHURINE 5.0 07/22/2022 1008   GLUCOSEU NEGATIVE 07/22/2022 1008   HGBUR NEGATIVE 07/22/2022 1008   BILIRUBINUR NEGATIVE 07/22/2022 1008   KETONESUR NEGATIVE 07/22/2022 1008   PROTEINUR NEGATIVE 07/22/2022 1008   NITRITE NEGATIVE 07/22/2022 1008   LEUKOCYTESUR SMALL (A) 07/22/2022 1008   Sepsis Labs Recent Labs  Lab 07/30/22 1412 07/31/22 1528 08/01/22 0708 08/02/22 0644  WBC 12.4* 12.9* 13.7* 13.8*   Microbiology Recent Results (from the past 240 hour(s))  C Difficile Quick Screen w PCR reflex     Status: Abnormal   Collection Time: 07/24/22  1:44 AM   Specimen: STOOL  Result Value Ref Range Status   C Diff antigen POSITIVE (A) NEGATIVE Final   C Diff toxin NEGATIVE NEGATIVE Final   C Diff interpretation Results are indeterminate. See PCR results.  Final    Comment: Performed at [facility], [address] [place], [place] [address]  C. Diff by PCR, Reflexed     Status: Abnormal   Collection Time: 07/24/22  1:44 AM  Result Value Ref Range Status   Toxigenic C. Difficile by PCR POSITIVE (A) NEGATIVE Final    Comment: Positive for toxigenic C. difficile with little to no toxin production. Only treat if clinical presentation suggests symptomatic illness. Performed at [facility], [address] [place], [place] [address]      Time coordinating discharge: Over 30 minutes  SIGNED:   Sidney Ace, MD  [facility] 08/02/2022, 2:00 PM Pager  If 7PM-7AM, please contact night-coverage

## 2022-08-02 NOTE — TOC Transition Note (Signed)
Transition of Care [facility]) - CM/SW Discharge Note   Patient Details  Name: Yolanda Moore MRN: [id number] Date of Birth: [birth year]  Transition of Care [facility]) CM/SW Contact:  Beverly Sessions, RN Phone Number: 08/02/2022, 2:20 PM   Clinical Narrative:      Insurance auth obtained  Patient will DC [id number] room [facility]A Anticipated DC date:08/02/22  Family notified:Wil[facility] Transport by: [name] Capers (private vehicle)   Per MD patient ready for DC to . RN, patient, patient's family, and facility notified of DC. Discharge Summary sent to facility. RN given number for report.  TOC signing off.  [name] [facility] [phone]        Patient Goals and CMS Choice        Discharge Placement                       Discharge Plan and Services                                     Social Determinants of Health (SDOH) Interventions     Readmission Risk Interventions     No data to display

## 2022-08-02 NOTE — Progress Notes (Signed)
AVS, social worker's paperwork, and signed gold DNR form placed in discharge packet for daughter to take to [facility]. Report called and given to Janett Billow, LPN at [facility]. All questions answered to satisfaction. Daughter to transport pt with all belongings.

## 2022-08-02 NOTE — TOC Progression Note (Signed)
Transition of Care [facility]) - Progression Note    Patient Details  Name: Yolanda Moore MRN: [id number] Date of Birth: [birth year]  Transition of Care [facility]) CM/SW Contact  Beverly Sessions, RN Phone Number: 08/02/2022, 11:48 AM  Clinical Narrative:      Insurance auth still pending       Expected Discharge Plan and Services                                                 Social Determinants of Health (SDOH) Interventions    Readmission Risk Interventions     No data to display

## 2022-08-02 NOTE — TOC Progression Note (Signed)
Transition of Care [facility]) - Progression Note    Patient Details  Name: Yolanda Moore MRN: [id number] Date of Birth: 1945/12/03  Transition of Care [facility]) CM/SW Contact  Beverly Sessions, RN Phone Number: 08/02/2022, 8:47 AM  Clinical Narrative:        Insurance auth pending     Expected Discharge Plan and Services                                                 Social Determinants of Health (SDOH) Interventions    Readmission Risk Interventions     No data to display

## 2022-08-02 NOTE — Progress Notes (Signed)
PROGRESS NOTE    Yolanda Moore  HGD:[id number] DOB: Jun 19, 1946 DOA: 07/22/2022 PCP: Dion Body, MD    Brief Narrative:  76 year old female with chronic kidney disease, dementia, hypertension, CAD.  A few months ago she was admitted with sepsis secondary to E. coli.  She was brought in with confusion.  She was found to have COVID-19 infection and a urinary tract infection.  She was started on antibiotics.  She did have an aspiration event on 07/23/2022 and was transferred to the ICU because she was gurgling on secretions.  Patient was kept n.p.o. that day.  She was placed on 4 L of oxygen.  She did better the next day and was tolerating diet and transferred back to the floor.  She was also diagnosed with C. difficile colitis, present on admission and started on Dificid.  Enterococcus growing out of the urine culture.  Patient switched from Unasyn to Augmentin.  Patient completed 3 days of remdesivir for COVID-19 infection.  Currently off oxygen.  Physical therapy recommending rehab.  Will remain in isolation until able to go out to rehab.   Assessment & Plan:   Principal Problem:   Acute respiratory failure with hypoxia (HCC) Active Problems:   COVID-19   C. difficile colitis   Acute metabolic encephalopathy   Enterococcus UTI   Essential hypertension   Chronic kidney disease, stage 3b (HCC)   Dementia without behavioral disturbance (HCC)   Weakness   Hyperlipidemia   Altered mental status   Acute decompensated heart failure (HCC)   Chronic diastolic CHF (congestive heart failure) (HCC)   Hypokalemia   Hypernatremia  * Acute respiratory failure with hypoxia (HCC) Pulse ox 77% on room air yesterday after likely aspiration episode.  Was on 4 L of oxygen with the aspiration episode.  Currently off oxygen at this point.  Completed course of Augmentin.  No further antibiotics at this time.  Repeat chest x-ray 9/24.  Clear.  No acute disease noted.    C. difficile colitis Present on  admission.  Continue Dificid at least 7 days after antibiotics stopped.   End date today 9/26   COVID-19 COVID-19 infection.  Completed 3 days of remdesivir.  Continue prednisone for now.  Dose tapered to 10 mg daily.  Continue to progressively taper.  Acute metabolic encephalopathy Mental status much improved.  Appears to be back to baseline   Enterococcus UTI Unasyn switched over to Augmentin.  Completed course of antibiotics.  Monitor vitals and fever curve.  No further antibiotics  Essential hypertension Low-dose Coreg   Hypernatremia improved Sodium went up to 149.  Received fluid bolus of D5 water.  Sodium back in reference range.   Hypokalemia Monitor and replace as necessary   Chronic diastolic CHF (congestive heart failure) (HCC) Last EF 65 to 70%.  Patient received a dose of Lasix on admission and a dose of Lasix 2 days ago.  Hold off on further Lasix dosing.  Currently on Coreg.  This is likely more secondary to respiratory issues rather than heart issues.  BNP not indicative of decompensated heart failure.  Chest x-ray clear.  No further Lasix at this time.   Hyperlipidemia Restarted Lipitor   Weakness Rehab recommended by PT and OT   Dementia without behavioral disturbance (HCC) Hold Aricept   Chronic kidney disease, stage 3b (HCC) Cr stable   DVT prophylaxis: SQ Lovenox Code Status: Full Family Communication: Daughter Sharren Bridge [phone] on 9/20, 9/21, 9/22 Disposition Plan: Status is: Inpatient Remains inpatient appropriate  because: Unsafe discharge plan.  Medically stable.  Awaiting.  For skilled nursing facility placement.  Waiting period For COVID isolation has expired today.  Patient is medically stable for discharge.  Level of care: Med-Surg  Consultants:  None  Procedures:  None  Antimicrobials:   Subjective: Seen and examined.  Flattened affect.  Resting comfortably in bed.  Answers all questions appropriately.  No visible  distress  Objective: Vitals:   08/01/22 1602 08/01/22 2046 08/02/22 0455 08/02/22 1000  BP: (!) 116/59 (!) 124/53 125/62 (!) 132/58  Pulse: 69 69 69 78  Resp: 20 18 18 19   Temp: 98.5 F (36.9 C) 98.3 F (36.8 C) 98.2 F (36.8 C) 98.4 F (36.9 C)  TempSrc: Oral Oral Oral Oral  SpO2: 100% 99% 98% 99%  Weight:      Height:        Intake/Output Summary (Last 24 hours) at 08/02/2022 1218 Last data filed at 08/02/2022 1000 Gross per 24 hour  Intake 480 ml  Output 1200 ml  Net -720 ml   Filed Weights   07/22/22 1400 07/23/22 1222 07/29/22 1652  Weight: 69.9 kg 65.2 kg 65.7 kg    Examination:  General exam: No acute distress.  Resting comfortably in bed.  Answers all questions appropriately. Respiratory system: Lungs clear.  Normal work of breathing.  Room air Cardiovascular system: S1-S2, RRR, no murmurs, no pedal edema Gastrointestinal system: Soft, NT/ND, hyperactive bowel sounds Central nervous system: Alert and oriented x2. No focal neurological deficits. Extremities: Symmetric 5 x 5 power. Skin: No rashes, lesions or ulcers Psychiatry: Judgement and insight appear impaired. Mood & affect flattened.     Data Reviewed: I have personally reviewed following labs and imaging studies  CBC: Recent Labs  Lab 07/29/22 0853 07/30/22 1412 07/31/22 1528 08/01/22 0708 08/02/22 0644  WBC 11.0* 12.4* 12.9* 13.7* 13.8*  NEUTROABS 4.9 8.7* 9.4* 7.1 8.9*  HGB 13.2 13.7 13.1 13.3 12.5  HCT 40.9 42.7 39.9 40.5 39.2  MCV 84.2 84.6 84.0 83.3 84.1  PLT 401* 445* 462* 419* [id number]   Basic Metabolic Panel: Recent Labs  Lab 07/27/22 1000 07/29/22 0853 07/30/22 0442 07/30/22 1412 07/31/22 1528 08/01/22 0708 08/02/22 0644  NA 145 141  --  138 136 138 137  K 4.1 3.7  --  4.2 5.0 4.2 4.6  CL 114* 110  --  107 104 106 107  CO2 22 24  --  25 26 26 26   GLUCOSE 130* 104*  --  170* 154* 83 98  BUN 29* 25*  --  26* 28* 20 25*  CREATININE 1.01* 1.01*  --  0.99 1.26* 1.06* 1.05*   CALCIUM 9.0 8.5*  --  9.0 8.9 9.2 9.0  MG 2.8*  --  2.6*  --   --   --  2.4  PHOS 1.9*  --  3.4  --   --   --   --    GFR: Estimated Creatinine Clearance: 39.2 mL/min (A) (by C-G formula based on SCr of 1.05 mg/dL (H)). Liver Function Tests: No results for input(s): "AST", "ALT", "ALKPHOS", "BILITOT", "PROT", "ALBUMIN" in the last 168 hours.  No results for input(s): "LIPASE", "AMYLASE" in the last 168 hours. No results for input(s): "AMMONIA" in the last 168 hours. Coagulation Profile: No results for input(s): "INR", "PROTIME" in the last 168 hours. Cardiac Enzymes: No results for input(s): "CKTOTAL", "CKMB", "CKMBINDEX", "TROPONINI" in the last 168 hours. BNP (last 3 results) No results for input(s): "PROBNP" in the last  8760 hours. HbA1C: No results for input(s): "HGBA1C" in the last 72 hours. CBG: No results for input(s): "GLUCAP" in the last 168 hours.  Lipid Profile: No results for input(s): "CHOL", "HDL", "LDLCALC", "TRIG", "CHOLHDL", "LDLDIRECT" in the last 72 hours. Thyroid Function Tests: No results for input(s): "TSH", "T4TOTAL", "FREET4", "T3FREE", "THYROIDAB" in the last 72 hours. Anemia Panel: No results for input(s): "VITAMINB12", "FOLATE", "FERRITIN", "TIBC", "IRON", "RETICCTPCT" in the last 72 hours. Sepsis Labs: No results for input(s): "PROCALCITON", "LATICACIDVEN" in the last 168 hours.   Recent Results (from the past 240 hour(s))  MRSA Next Gen by PCR, Nasal     Status: Abnormal   Collection Time: 07/23/22 12:25 PM   Specimen: Nasal Mucosa; Nasal Swab  Result Value Ref Range Status   MRSA by PCR Next Gen DETECTED (A) NOT DETECTED Final    Comment: RESULT CALLED TO, READ BACK BY AND VERIFIED WITH: CATHRINE CLAYTON @1353  07/23/22 MJU (NOTE) The GeneXpert MRSA Assay (FDA approved for NASAL specimens only), is one component of a comprehensive MRSA colonization surveillance program. It is not intended to diagnose MRSA infection nor to guide or monitor  treatment for MRSA infections. Test performance is not FDA approved in patients less than 17 years old. Performed at [facility], [place], [place] [address]   C Difficile Quick Screen w PCR reflex     Status: Abnormal   Collection Time: 07/24/22  1:44 AM   Specimen: STOOL  Result Value Ref Range Status   C Diff antigen POSITIVE (A) NEGATIVE Final   C Diff toxin NEGATIVE NEGATIVE Final   C Diff interpretation Results are indeterminate. See PCR results.  Final    Comment: Performed at [facility], [address] [place], [place] [address]  C. Diff by PCR, Reflexed     Status: Abnormal   Collection Time: 07/24/22  1:44 AM  Result Value Ref Range Status   Toxigenic C. Difficile by PCR POSITIVE (A) NEGATIVE Final    Comment: Positive for toxigenic C. difficile with little to no toxin production. Only treat if clinical presentation suggests symptomatic illness. Performed at [facility], [address] [place], [place] [address]          Radiology Studies: [facility] Chest [place] 1 View  Result Date: 07/31/2022 CLINICAL DATA:  COVID-19 EXAM: PORTABLE CHEST 1 VIEW COMPARISON:  07/23/2022 FINDINGS: Stable mild cardiomegaly. Aortic atherosclerosis. No focal airspace consolidation, pleural effusion, or pneumothorax. IMPRESSION: No active disease. Electronically Signed   By: Davina Poke D.O.   On: 07/31/2022 16:36        Scheduled Meds:  aspirin  81 mg Oral Daily   atorvastatin  40 mg Oral QPM   carvedilol  3.125 mg Oral BID WC   enoxaparin (LOVENOX) injection  40 mg Subcutaneous Q24H   feeding supplement  237 mL Oral TID BM   fidaxomicin  200 mg Oral BID   Ipratropium-Albuterol  2 puff Inhalation QID   multivitamin with minerals  1 tablet Oral Daily   mouth rinse  15 mL Mouth Rinse 4 times per day   predniSONE  20 mg Oral Q breakfast   traZODone  50 mg Oral QHS   Continuous Infusions:  sodium chloride Stopped (07/25/22 1818)      LOS: 10 days    Sidney Ace, MD [facility]   If 7PM-7AM, please contact night-coverage  08/02/2022, 12:18 PM 1

## 2022-08-02 NOTE — Progress Notes (Addendum)
PROGRESS NOTE    Yolanda Moore  [id number] DOB: 07-Mar-1946 DOA: 07/22/2022 PCP: Dion Body, MD    Brief Narrative:  76 year old female with chronic kidney disease, dementia, hypertension, CAD.  A few months ago she was admitted with sepsis secondary to E. coli.  She was brought in with confusion.  She was found to have COVID-19 infection and a urinary tract infection.  She was started on antibiotics.  She did have an aspiration event on 07/23/2022 and was transferred to the ICU because she was gurgling on secretions.  Patient was kept n.p.o. that day.  She was placed on 4 L of oxygen.  She did better the next day and was tolerating diet and transferred back to the floor.  She was also diagnosed with C. difficile colitis, present on admission and started on Dificid.  Enterococcus growing out of the urine culture.  Patient switched from Unasyn to Augmentin.  Patient completed 3 days of remdesivir for COVID-19 infection.  Currently off oxygen.  Physical therapy recommending rehab.  Will remain in isolation until able to go out to rehab.   Assessment & Plan:   Principal Problem:   Acute respiratory failure with hypoxia (HCC) Active Problems:   COVID-19   C. difficile colitis   Acute metabolic encephalopathy   Enterococcus UTI   Essential hypertension   Chronic kidney disease, stage 3b (HCC)   Dementia without behavioral disturbance (HCC)   Weakness   Hyperlipidemia   Altered mental status   Acute decompensated heart failure (HCC)   Chronic diastolic CHF (congestive heart failure) (HCC)   Hypokalemia   Hypernatremia  * Acute respiratory failure with hypoxia (HCC) Pulse ox 77% on room air yesterday after likely aspiration episode.  Was on 4 L of oxygen with the aspiration episode.  Currently off oxygen at this point.  Completed course of Augmentin.  No further antibiotics at this time.  Repeat chest x-ray 9/24 clear   C. difficile colitis Present on admission.  Continue Dificid  at least 7 days after antibiotics stopped.  End date 9/26   COVID-19 COVID-19 infection.  Completed 3 days of remdesivir.  Continue prednisone for now.  Dose tapered to 20 mg daily.  Continue to taper aggressively. 10mg  daily starting [id number]  Acute metabolic encephalopathy Mental status much improved.  Appears to be back to baseline   Enterococcus UTI Unasyn switched over to Augmentin.  Completed course of antibiotics  Essential hypertension Low-dose Coreg   Hypernatremia improved Sodium went up to 149.  Received fluid bolus of D5 water.  Sodium back in reference range.   Hypokalemia Monitor and replace as necessary   Chronic diastolic CHF (congestive heart failure) (HCC) Last EF 65 to 70%.  Patient received a dose of Lasix on admission and a dose of Lasix 2 days ago.  Hold off on further Lasix dosing.  Currently on Coreg.  This is likely more secondary to respiratory issues rather than heart issues.  BMP normal.  Lasix not indicated   Hyperlipidemia Restarted Lipitor   Weakness Rehab recommended by PT and OT   Dementia without behavioral disturbance (HCC) Hold Aricept   Chronic kidney disease, stage 3b (HCC) Cr stable   DVT prophylaxis: SQ Lovenox Code Status: Full Family Communication: Daughter Sharren Bridge [phone] on 9/20, 9/21, 9/22 Disposition Plan: Status is: Inpatient Remains inpatient appropriate because: Unsafe discharge plan.  Medically stable.  Awaiting insurance authorization for SNF.   Level of care: Med-Surg  Consultants:  None  Procedures:  None  Antimicrobials:   Subjective: Seen and examined.  Flattened affect.  Resting comfortably in bed.  Answers all questions appropriately.  No visible distress  Objective: Vitals:   08/01/22 1602 08/01/22 2046 08/02/22 0455 08/02/22 1000  BP: (!) 116/59 (!) 124/53 125/62 (!) 132/58  Pulse: 69 69 69 78  Resp: 20 18 18 19   Temp: 98.5 F (36.9 C) 98.3 F (36.8 C) 98.2 F (36.8 C) 98.4 F (36.9 C)   TempSrc: Oral Oral Oral Oral  SpO2: 100% 99% 98% 99%  Weight:      Height:        Intake/Output Summary (Last 24 hours) at 08/02/2022 1230 Last data filed at 08/02/2022 1000 Gross per 24 hour  Intake 480 ml  Output 1200 ml  Net -720 ml   Filed Weights   07/22/22 1400 07/23/22 1222 07/29/22 1652  Weight: 69.9 kg 65.2 kg 65.7 kg    Examination:  General exam: No acute distress Respiratory system: Bibasilar crackles.  Normal work of breathing.  Room air Cardiovascular system: S1-S2, RRR, no murmurs, no pedal edema Gastrointestinal system: Soft, NT/ND, hyperactive bowel sounds Central nervous system: Alert and oriented x2. No focal neurological deficits. Extremities: Symmetric 5 x 5 power. Skin: No rashes, lesions or ulcers Psychiatry: Judgement and insight appear impaired. Mood & affect flattened.     Data Reviewed: I have personally reviewed following labs and imaging studies  CBC: Recent Labs  Lab 07/29/22 0853 07/30/22 1412 07/31/22 1528 08/01/22 0708 08/02/22 0644  WBC 11.0* 12.4* 12.9* 13.7* 13.8*  NEUTROABS 4.9 8.7* 9.4* 7.1 8.9*  HGB 13.2 13.7 13.1 13.3 12.5  HCT 40.9 42.7 39.9 40.5 39.2  MCV 84.2 84.6 84.0 83.3 84.1  PLT 401* 445* 462* 419* [id number]   Basic Metabolic Panel: Recent Labs  Lab 07/27/22 1000 07/29/22 0853 07/30/22 0442 07/30/22 1412 07/31/22 1528 08/01/22 0708 08/02/22 0644  NA 145 141  --  138 136 138 137  K 4.1 3.7  --  4.2 5.0 4.2 4.6  CL 114* 110  --  107 104 106 107  CO2 22 24  --  25 26 26 26   GLUCOSE 130* 104*  --  170* 154* 83 98  BUN 29* 25*  --  26* 28* 20 25*  CREATININE 1.01* 1.01*  --  0.99 1.26* 1.06* 1.05*  CALCIUM 9.0 8.5*  --  9.0 8.9 9.2 9.0  MG 2.8*  --  2.6*  --   --   --  2.4  PHOS 1.9*  --  3.4  --   --   --   --    GFR: Estimated Creatinine Clearance: 39.2 mL/min (A) (by C-G formula based on SCr of 1.05 mg/dL (H)). Liver Function Tests: No results for input(s): "AST", "ALT", "ALKPHOS", "BILITOT", "PROT",  "ALBUMIN" in the last 168 hours.  No results for input(s): "LIPASE", "AMYLASE" in the last 168 hours. No results for input(s): "AMMONIA" in the last 168 hours. Coagulation Profile: No results for input(s): "INR", "PROTIME" in the last 168 hours. Cardiac Enzymes: No results for input(s): "CKTOTAL", "CKMB", "CKMBINDEX", "TROPONINI" in the last 168 hours. BNP (last 3 results) No results for input(s): "PROBNP" in the last 8760 hours. HbA1C: No results for input(s): "HGBA1C" in the last 72 hours. CBG: No results for input(s): "GLUCAP" in the last 168 hours.  Lipid Profile: No results for input(s): "CHOL", "HDL", "LDLCALC", "TRIG", "CHOLHDL", "LDLDIRECT" in the last 72 hours. Thyroid Function Tests: No results for input(s): "TSH", "T4TOTAL", "FREET4", "T3FREE", "THYROIDAB" in  the last 72 hours. Anemia Panel: No results for input(s): "VITAMINB12", "FOLATE", "FERRITIN", "TIBC", "IRON", "RETICCTPCT" in the last 72 hours. Sepsis Labs: No results for input(s): "PROCALCITON", "LATICACIDVEN" in the last 168 hours.   Recent Results (from the past 240 hour(s))  C Difficile Quick Screen w PCR reflex     Status: Abnormal   Collection Time: 07/24/22  1:44 AM   Specimen: STOOL  Result Value Ref Range Status   C Diff antigen POSITIVE (A) NEGATIVE Final   C Diff toxin NEGATIVE NEGATIVE Final   C Diff interpretation Results are indeterminate. See PCR results.  Final    Comment: Performed at [facility], [address] [place], [place] [address]  C. Diff by PCR, Reflexed     Status: Abnormal   Collection Time: 07/24/22  1:44 AM  Result Value Ref Range Status   Toxigenic C. Difficile by PCR POSITIVE (A) NEGATIVE Final    Comment: Positive for toxigenic C. difficile with little to no toxin production. Only treat if clinical presentation suggests symptomatic illness. Performed at [facility], [address] [place], [place] [address]          Radiology Studies: [facility] Chest  [place] 1 View  Result Date: 07/31/2022 CLINICAL DATA:  COVID-19 EXAM: PORTABLE CHEST 1 VIEW COMPARISON:  07/23/2022 FINDINGS: Stable mild cardiomegaly. Aortic atherosclerosis. No focal airspace consolidation, pleural effusion, or pneumothorax. IMPRESSION: No active disease. Electronically Signed   By: Davina Poke D.O.   On: 07/31/2022 16:36        Scheduled Meds:  aspirin  81 mg Oral Daily   atorvastatin  40 mg Oral QPM   carvedilol  3.125 mg Oral BID WC   enoxaparin (LOVENOX) injection  40 mg Subcutaneous Q24H   feeding supplement  237 mL Oral TID BM   fidaxomicin  200 mg Oral BID   Ipratropium-Albuterol  2 puff Inhalation QID   multivitamin with minerals  1 tablet Oral Daily   mouth rinse  15 mL Mouth Rinse 4 times per day   [START ON 08/03/2022] predniSONE  10 mg Oral Q breakfast   traZODone  50 mg Oral QHS   Continuous Infusions:  sodium chloride Stopped (07/25/22 1818)     LOS: 10 days    Sidney Ace, MD [facility]   If 7PM-7AM, please contact night-coverage  08/02/2022, 12:30 PM 1

## 2022-08-02 NOTE — Progress Notes (Signed)
Occupational Therapy Treatment Patient Details Name: YURANI FETTES MRN: [id number] DOB: 1946-05-18 Today's Date: 08/02/2022   History of present illness Patient is a 76 year old female with Acute respiratory failure with hypoxia, [id number], Acute metabolic encephalopathy, UTI, C. difficile colitis. History of hypertension, hyperlipidemia, dementia, CKD stage IIIb, coronary artery disease  and from independent living facility.   OT comments  Upon entering session, pt resting in bed and agreeable to OT. Pt motivated to sit up in recliner chair. Pt required Mod A to complete bed mobility, Min guard for functional transfers, and Min guard to take steps toward recliner using RW. Pt with soiled brief and required Max A to don/doff new brief. Pt able to follow one step commands with increased time and required VC for sequencing of tasks and safety this date. Pt left sitting in recliner with all needs in reach. Pt is making progress toward goal completion. D/C recommendation remains appropriate. OT will continue to follow acutely.     Recommendations for follow up therapy are one component of a multi-disciplinary discharge planning process, led by the attending physician.  Recommendations may be updated based on patient status, additional functional criteria and insurance authorization.    Follow Up Recommendations  Skilled nursing-short term rehab (<3 hours/day)    Assistance Recommended at Discharge Frequent or constant Supervision/Assistance  Patient can return home with the following  A lot of help with walking and/or transfers;A lot of help with bathing/dressing/bathroom;Assist for transportation;Help with stairs or ramp for entrance;Direct supervision/assist for medications management;Assistance with cooking/housework;Direct supervision/assist for financial management   Equipment Recommendations  Other (comment) (defer to next venue of care)    Recommendations for Other Services       Precautions / Restrictions Precautions Precautions: Fall Restrictions Weight Bearing Restrictions: No       Mobility Bed Mobility Overal bed mobility: Needs Assistance Bed Mobility: Supine to Sit     Supine to sit: Mod assist, HOB elevated     General bed mobility comments: VC for technique and sequencing    Transfers Overall transfer level: Needs assistance Equipment used: Rolling walker (2 wheels) Transfers: Sit to/from Stand Sit to Stand: Min guard           General transfer comment: STS from EOB and recliner     Balance Overall balance assessment: Needs assistance Sitting-balance support: Feet supported Sitting balance-Leahy Scale: Good     Standing balance support: Bilateral upper extremity supported Standing balance-Leahy Scale: Fair Standing balance comment: pt maintained static standing balance with BUE support while OT assisted with changing brief                           ADL either performed or assessed with clinical judgement   ADL Overall ADL's : Needs assistance/impaired                     Lower Body Dressing: Minimal assistance;Maximal assistance;Sitting/lateral leans;Sit to/from stand Lower Body Dressing Details (indicate cue type and reason): Min A to don slip on shoes sitting EOB, Max A to don/doff brief in standing             Functional mobility during ADLs: Min guard;Cueing for sequencing;Cueing for safety;Rolling walker (2 wheels) (to take ~3 steps toward recliner)      Extremity/Trunk Assessment Upper Extremity Assessment Upper Extremity Assessment: Generalized weakness   Lower Extremity Assessment Lower Extremity Assessment: Generalized weakness  Vision Baseline Vision/History: 1 Wears glasses Patient Visual Report: No change from baseline     Perception     Praxis      Cognition Arousal/Alertness: Awake/alert Behavior During Therapy: WFL for tasks assessed/performed Overall Cognitive  Status: History of cognitive impairments - at baseline                                 General Comments: cues for sequencing and task initiation. patient is cooperative throughout session        Exercises      Shoulder Instructions       General Comments      Pertinent Vitals/ Pain       Pain Assessment Pain Assessment: No/denies pain  Home Living                                          Prior Functioning/Environment              Frequency  Min 2X/week        Progress Toward Goals  OT Goals(current goals can now be found in the care plan section)  Progress towards OT goals: Progressing toward goals  Acute Rehab OT Goals Patient Stated Goal: none stated OT Goal Formulation: With patient Time For Goal Achievement: 08/08/22 Potential to Achieve Goals: Good  Plan Discharge plan remains appropriate;Frequency remains appropriate    Co-evaluation                 AM-PAC OT "6 Clicks" Daily Activity     Outcome Measure   Help from another person eating meals?: A Little Help from another person taking care of personal grooming?: A Little Help from another person toileting, which includes using toliet, bedpan, or urinal?: A Lot Help from another person bathing (including washing, rinsing, drying)?: A Lot Help from another person to put on and taking off regular upper body clothing?: A Little Help from another person to put on and taking off regular lower body clothing?: A Lot 6 Click Score: 15    End of Session Equipment Utilized During Treatment: Rolling walker (2 wheels);Gait belt  OT Visit Diagnosis: Unsteadiness on feet (R26.81);History of falling (Z91.81);Muscle weakness (generalized) (M62.81);Other symptoms and signs involving cognitive function   Activity Tolerance Patient tolerated treatment well   Patient Left with call bell/phone within reach;in chair;with chair alarm set   Nurse Communication Mobility  status        Time: [id number] OT Time Calculation (min): 22 min  Charges: OT General Charges $OT Visit: 1 Visit OT Treatments $Self Care/Home Management : 8-22 mins  [facility] MS, OTR/L ascom [phone]  08/02/22, 3:41 PM

## 2022-08-02 NOTE — Plan of Care (Signed)
  Problem: Pain Managment: Goal: General experience of comfort will improve Outcome: Progressing   Problem: Safety: Goal: Ability to remain free from injury will improve Outcome: Progressing   Problem: Skin Integrity: Goal: Risk for impaired skin integrity will decrease Outcome: Progressing   

## 2022-08-02 NOTE — Care Management Important Message (Signed)
Important Message  Patient Details  Name: Yolanda Moore MRN: [id number] Date of Birth: 1946/09/14   Medicare Important Message Given:  Yes  Reviewed Medicare IM with Sharren Bridge, daughter, at [phone].  Copy of Medicare IM sent securely to daughter's attention at email address provided: [email or web address].   Dannette Barbara 08/02/2022, 10:44 AM

## 2022-12-08 DEATH — deceased

## 2023-11-21 IMAGING — CT CT HEAD W/O CM
4 series · 16 of 47 positions shown, 18 images · non-contrast
Comparison: None Available.

CT head January 04, 2022.

CLINICAL DATA: Fall.

EXAM:
CT HEAD WITHOUT CONTRAST
CT MAXILLOFACIAL WITHOUT CONTRAST
TECHNIQUE: Multidetector CT imaging of the head and maxillofacial structures
were performed using the standard protocol without intravenous
contrast. Multiplanar CT image reconstructions of the maxillofacial
structures were also generated.
RADIATION DOSE REDUCTION: This exam was performed according to the
departmental dose-optimization program which includes automated
exposure control, adjustment of the mA and/or kV according to
patient size and/or use of iterative reconstruction technique.

[Series 2: head bone · axial · 0.42mm/px · z∈[-146,-114]mm · 3 of 78 slices shown]
[im 8/78  bone]
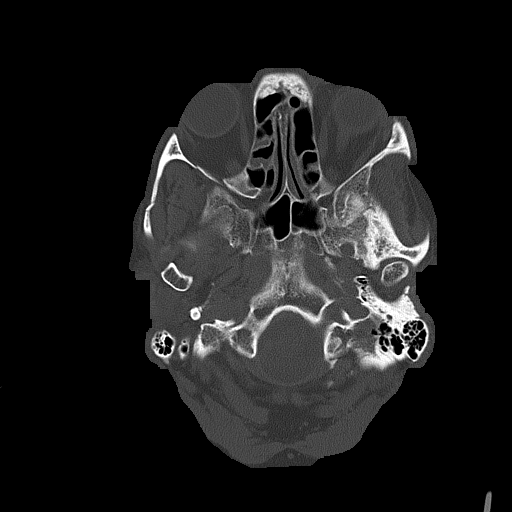
[im 16/78  bone]
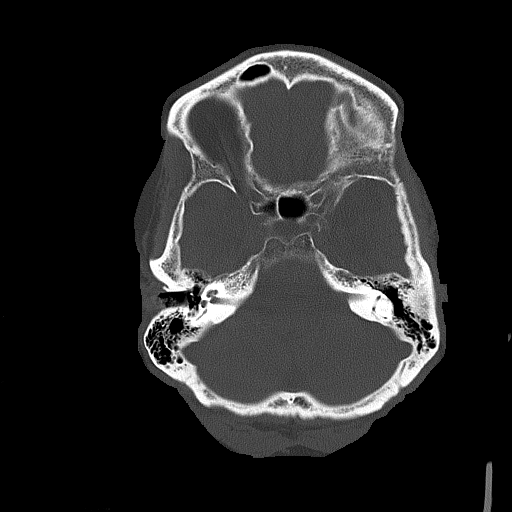
[im 24/78  bone]
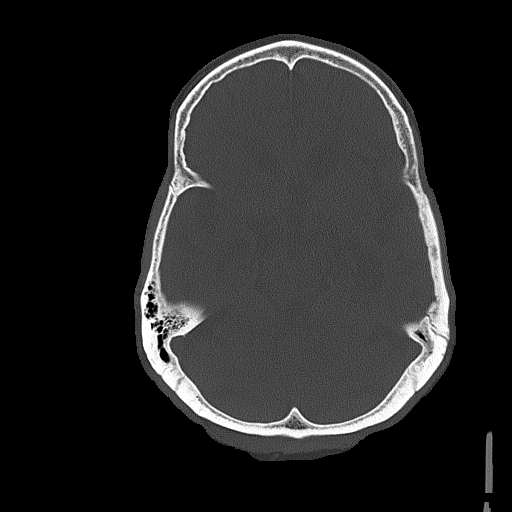

[Series 3: head wo · axial · 0.42mm/px · z∈[-145,-30]mm · 7 of 31 slices shown, 9 images]
[im 4/31  brain]
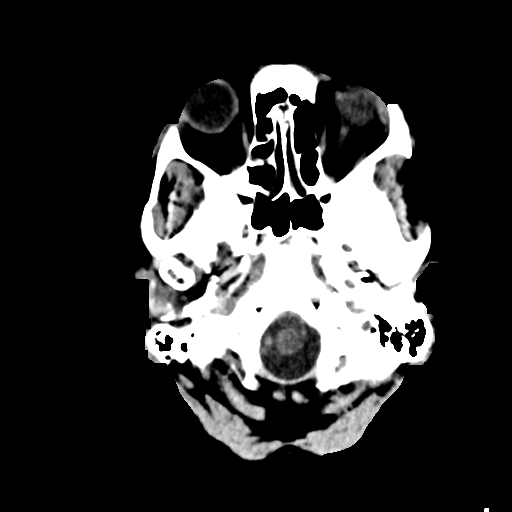
[im 4/31  bone]
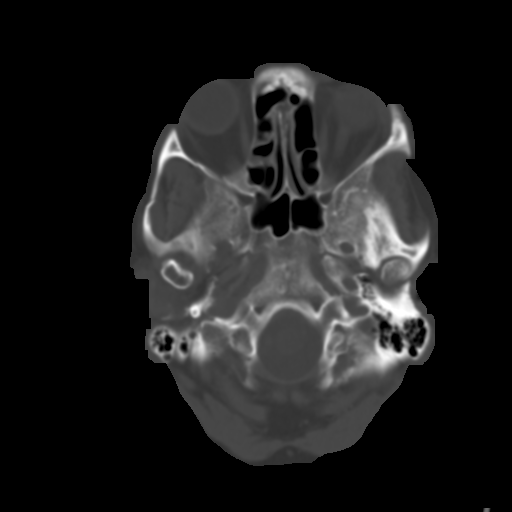
[im 8/31  brain]
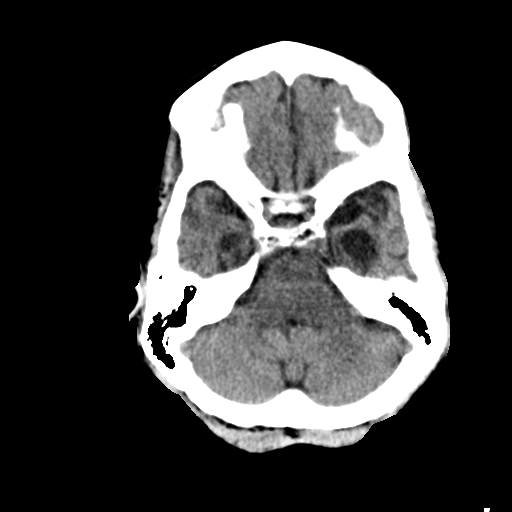
[im 12/31  brain]
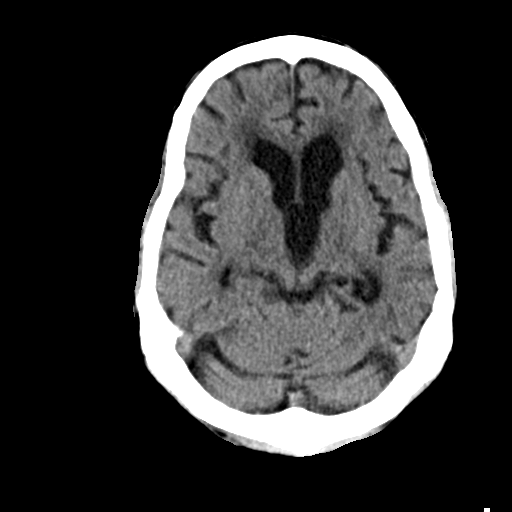
[im 16/31  brain]
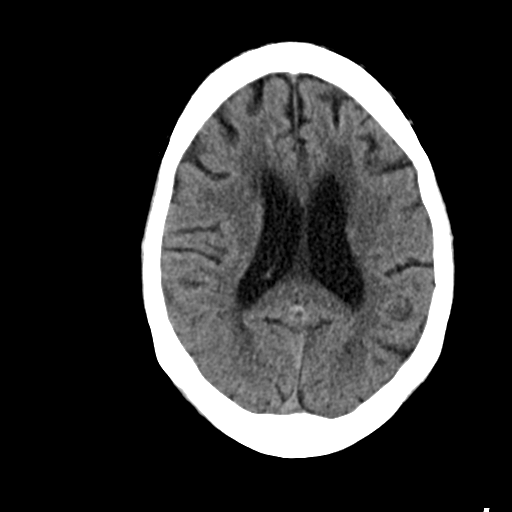
[im 19/31  brain]
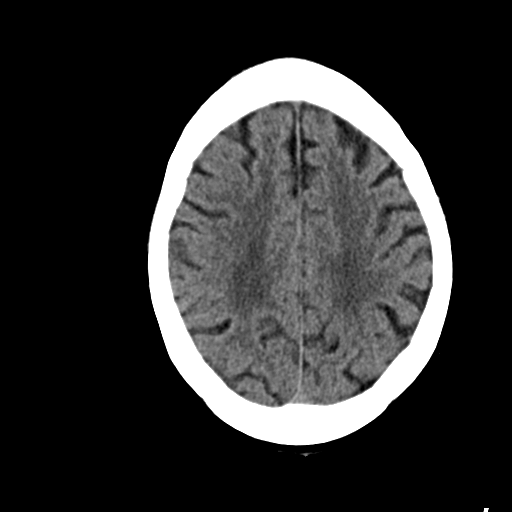
[im 19/31  bone]
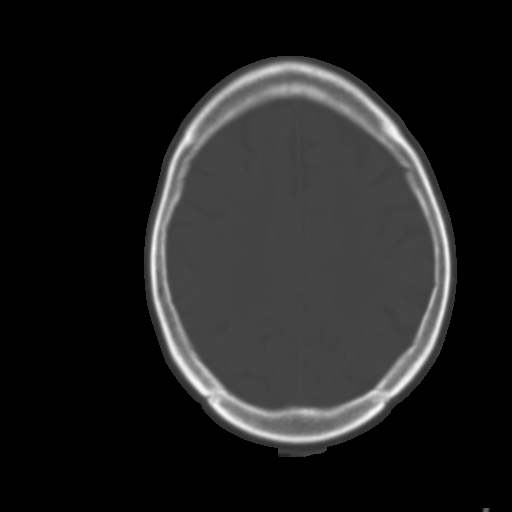
[im 23/31  brain]
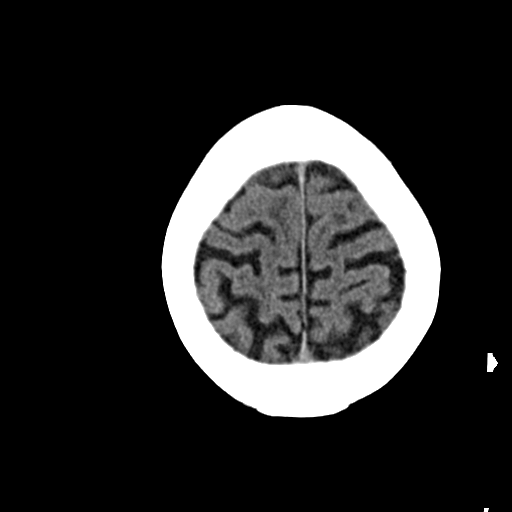
[im 27/31  brain]
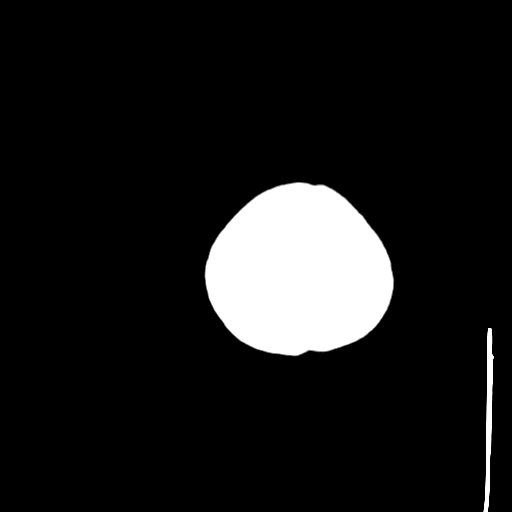

[Series 4: coronal soft tissue · coronal · 0.30mm/px · 3 of 65 slices shown]
[im 22/65  brain]
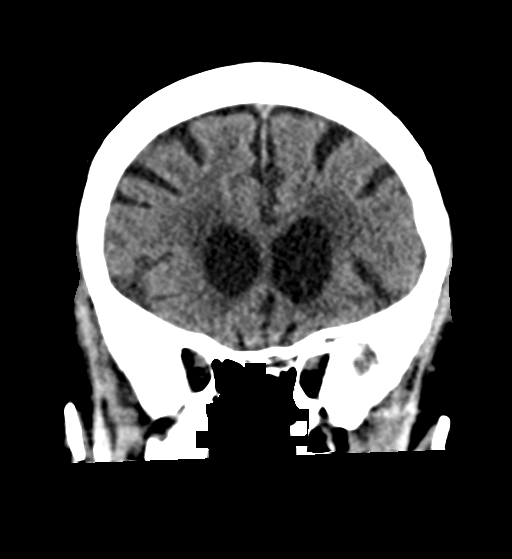
[im 29/65  brain]
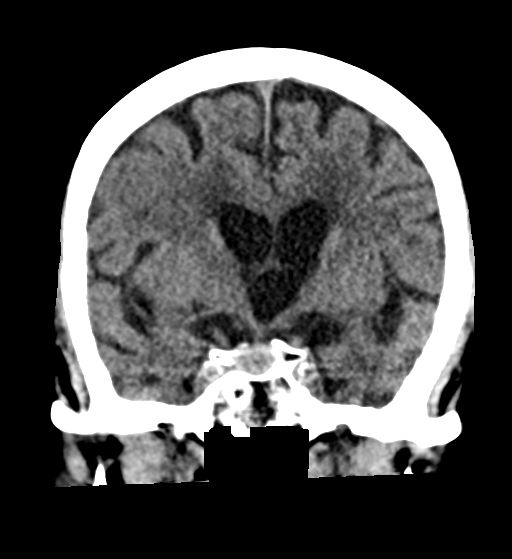
[im 36/65  brain]
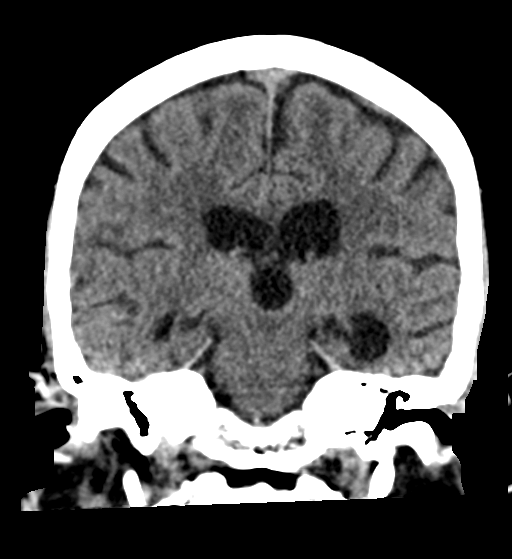

[Series 5: sagittal soft tissue · sagittal · 0.33mm/px · 3 of 52 slices shown]
[im 18/52  brain]
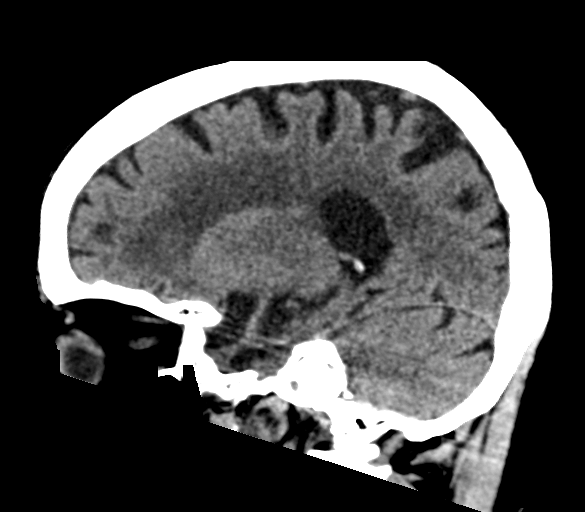
[im 26/52  brain]
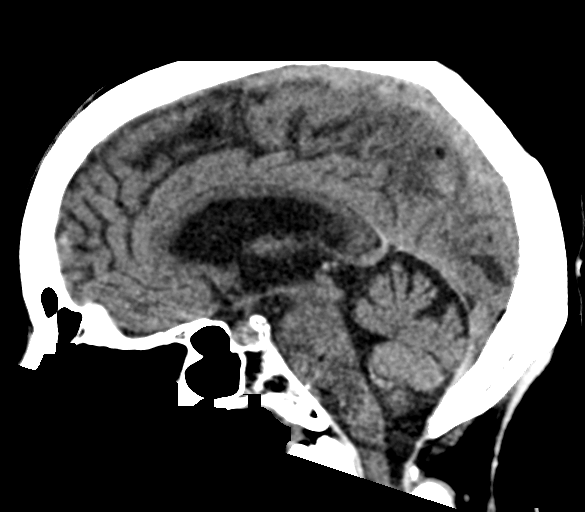
[im 35/52  brain]
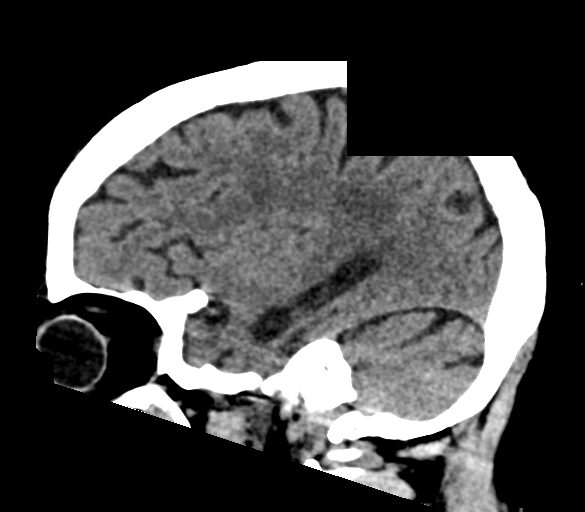

[16 of 47 positions shown; findings below may reference images not displayed]

FINDINGS: CT HEAD FINDINGS

Brain: No evidence of acute infarction, hemorrhage, hydrocephalus,
extra-axial collection or mass lesion/mass effect. Similar chronic
microvascular ischemic disease and atrophy. Ex vacuo ventricular
dilation, similar.

Vascular: Intracranial atherosclerosis. No hyperdense vessel
identified.

Skull: No acute calvarial fracture.

Other: No mastoid effusions.

CT MAXILLOFACIAL FINDINGS

Osseous: Comminuted fractures involving all walls of the right
maxillary sinus. Acute slightly displaced fracture of the right
zygoma. Mandibles are located.

Orbits: Mildly displaced fracture of the right orbital floor with
involvement of the infraorbital foramen (series 8, image 26). Trace
adjacent hematoma. Otherwise unremarkable orbits.

Sinuses: Right maxillary hemosinus with air-fluid level.

Soft tissues: Overlying contusions and subcutaneous gas along the
right maxilla, in the infratemporal fossa and in the pre maxillary
fat.

Other: Many dental periapical cysts lucencies and dental caries.
Postsurgical changes associated with the and oval anteriorly.
IMPRESSION: Maxillofacial CT:

1. Mildly displaced fracture of the right orbital floor with
involvement of the infraorbital foramen. Trace adjacent hematoma.
2. Acute comminuted fractures of all walls of the right maxillary
sinus with hemosinus.
3. Acute slightly displaced fracture of the right zygoma.
4. Adjacent contusions and subcutaneous gas in the soft tissues.

Head CT:

1. No acute intracranial abnormality.
2. Similar chronic microvascular disease and cerebral atrophy
([id number]).
